# Patient Record
Sex: Female | Born: 1970 | Race: Black or African American | Hispanic: No | Marital: Married | Smoking: Never smoker
Health system: Southern US, Community
[De-identification: ages and names within clinical notes are randomized; demographics above are authoritative.]

## PROBLEM LIST (undated history)

## (undated) DIAGNOSIS — E739 Lactose intolerance, unspecified: Secondary | ICD-10-CM

## (undated) DIAGNOSIS — E559 Vitamin D deficiency, unspecified: Secondary | ICD-10-CM

## (undated) DIAGNOSIS — M549 Dorsalgia, unspecified: Secondary | ICD-10-CM

## (undated) DIAGNOSIS — M7989 Other specified soft tissue disorders: Secondary | ICD-10-CM

## (undated) DIAGNOSIS — M2550 Pain in unspecified joint: Secondary | ICD-10-CM

## (undated) DIAGNOSIS — I890 Lymphedema, not elsewhere classified: Secondary | ICD-10-CM

## (undated) DIAGNOSIS — R0602 Shortness of breath: Secondary | ICD-10-CM

## (undated) DIAGNOSIS — F419 Anxiety disorder, unspecified: Secondary | ICD-10-CM

## (undated) HISTORY — DX: Dorsalgia, unspecified: M54.9

## (undated) HISTORY — DX: Pain in unspecified joint: M25.50

## (undated) HISTORY — DX: Shortness of breath: R06.02

## (undated) HISTORY — PX: BREAST BIOPSY: SHX20

## (undated) HISTORY — DX: Lactose intolerance, unspecified: E73.9

## (undated) HISTORY — DX: Anxiety disorder, unspecified: F41.9

## (undated) HISTORY — DX: Vitamin D deficiency, unspecified: E55.9

## (undated) HISTORY — DX: Lymphedema, not elsewhere classified: I89.0

## (undated) HISTORY — DX: Other specified soft tissue disorders: M79.89

---

## 2008-06-20 DIAGNOSIS — J309 Allergic rhinitis, unspecified: Secondary | ICD-10-CM | POA: Insufficient documentation

## 2008-06-20 DIAGNOSIS — M25569 Pain in unspecified knee: Secondary | ICD-10-CM | POA: Insufficient documentation

## 2008-06-20 DIAGNOSIS — N63 Unspecified lump in unspecified breast: Secondary | ICD-10-CM | POA: Insufficient documentation

## 2008-06-20 DIAGNOSIS — Z833 Family history of diabetes mellitus: Secondary | ICD-10-CM | POA: Insufficient documentation

## 2008-06-20 DIAGNOSIS — Z800 Family history of malignant neoplasm of digestive organs: Secondary | ICD-10-CM | POA: Insufficient documentation

## 2013-12-25 DIAGNOSIS — R87619 Unspecified abnormal cytological findings in specimens from cervix uteri: Secondary | ICD-10-CM | POA: Insufficient documentation

## 2013-12-25 DIAGNOSIS — N840 Polyp of corpus uteri: Secondary | ICD-10-CM | POA: Insufficient documentation

## 2013-12-25 DIAGNOSIS — N882 Stricture and stenosis of cervix uteri: Secondary | ICD-10-CM | POA: Insufficient documentation

## 2013-12-25 DIAGNOSIS — D219 Benign neoplasm of connective and other soft tissue, unspecified: Secondary | ICD-10-CM | POA: Insufficient documentation

## 2016-03-13 DIAGNOSIS — E559 Vitamin D deficiency, unspecified: Secondary | ICD-10-CM | POA: Insufficient documentation

## 2018-06-13 DIAGNOSIS — I872 Venous insufficiency (chronic) (peripheral): Secondary | ICD-10-CM | POA: Insufficient documentation

## 2018-06-13 DIAGNOSIS — G8929 Other chronic pain: Secondary | ICD-10-CM | POA: Insufficient documentation

## 2018-07-13 DIAGNOSIS — Z0001 Encounter for general adult medical examination with abnormal findings: Secondary | ICD-10-CM | POA: Insufficient documentation

## 2018-07-13 DIAGNOSIS — Z0000 Encounter for general adult medical examination without abnormal findings: Secondary | ICD-10-CM | POA: Insufficient documentation

## 2018-10-06 DIAGNOSIS — H7292 Unspecified perforation of tympanic membrane, left ear: Secondary | ICD-10-CM | POA: Insufficient documentation

## 2019-08-09 DIAGNOSIS — Z20828 Contact with and (suspected) exposure to other viral communicable diseases: Secondary | ICD-10-CM | POA: Diagnosis not present

## 2019-08-09 DIAGNOSIS — R6883 Chills (without fever): Secondary | ICD-10-CM | POA: Diagnosis not present

## 2019-10-13 ENCOUNTER — Ambulatory Visit: Payer: Self-pay | Attending: Internal Medicine

## 2019-10-13 DIAGNOSIS — Z23 Encounter for immunization: Secondary | ICD-10-CM | POA: Insufficient documentation

## 2019-10-13 NOTE — Progress Notes (Signed)
   Covid-19 Vaccination Clinic  Name:  Kiaundra Kawamura    MRN: [id number] DOB: [birth year]  10/13/2019  [name] was observed post Covid-19 immunization for 15 minutes without incident. She was provided with Vaccine Information Sheet and instruction to access the V-Safe system.   Ms. Albaladejo was instructed to call 911 with any severe reactions post vaccine: Marland Kitchen Difficulty breathing  . Swelling of face and throat  . A fast heartbeat  . A bad rash all over body  . Dizziness and weakness

## 2019-11-03 ENCOUNTER — Ambulatory Visit: Payer: Self-pay | Attending: Internal Medicine

## 2019-11-03 DIAGNOSIS — Z23 Encounter for immunization: Secondary | ICD-10-CM

## 2019-11-03 NOTE — Progress Notes (Signed)
   Covid-19 Vaccination Clinic  Name:  Theresa Johnson    MRN: [id number] DOB: 1971/08/03  11/03/2019  [name] was observed post Covid-19 immunization for 15 minutes without incident. She was provided with Vaccine Information Sheet and instruction to access the V-Safe system.   [name] was instructed to call 911 with any severe reactions post vaccine: Marland Kitchen Difficulty breathing  . Swelling of face and throat  . A fast heartbeat  . A bad rash all over body  . Dizziness and weakness   Immunizations Administered    Name Date Dose VIS Date Route   Pfizer COVID-19 Vaccine 11/03/2019 10:02 AM 0.3 mL 07/21/2019 Intramuscular   Manufacturer: [place]   Lot: [id number]   [place]: [id number]

## 2019-11-14 ENCOUNTER — Ambulatory Visit: Payer: Self-pay

## 2019-11-22 ENCOUNTER — Encounter: Payer: Self-pay | Admitting: Podiatry

## 2019-11-22 ENCOUNTER — Other Ambulatory Visit: Payer: Self-pay

## 2019-11-22 ENCOUNTER — Ambulatory Visit (INDEPENDENT_AMBULATORY_CARE_PROVIDER_SITE_OTHER): Payer: BC Managed Care – PPO

## 2019-11-22 ENCOUNTER — Ambulatory Visit: Payer: BC Managed Care – PPO | Admitting: Podiatry

## 2019-11-22 VITALS — Temp 98.7°F

## 2019-11-22 DIAGNOSIS — M898X9 Other specified disorders of bone, unspecified site: Secondary | ICD-10-CM | POA: Diagnosis not present

## 2019-11-22 DIAGNOSIS — M79671 Pain in right foot: Secondary | ICD-10-CM | POA: Diagnosis not present

## 2019-11-22 DIAGNOSIS — M722 Plantar fascial fibromatosis: Secondary | ICD-10-CM

## 2019-11-22 MED ORDER — DICLOFENAC SODIUM 75 MG PO TBEC: 75.0000 mg | DELAYED_RELEASE_TABLET | Freq: Two times a day (BID) | ORAL | 2 refills | Status: DC

## 2019-11-22 NOTE — Patient Instructions (Signed)

## 2019-11-22 NOTE — Progress Notes (Signed)
   Subjective:    Patient ID: Theresa Johnson, female    DOB: [birth year], 49 y.o.   MRN: [id number]  HPI    Review of Systems  All other systems reviewed and are negative.      Objective:   Physical Exam        Assessment & Plan:

## 2019-11-23 ENCOUNTER — Other Ambulatory Visit: Payer: Self-pay | Admitting: Podiatry

## 2019-11-23 DIAGNOSIS — M898X9 Other specified disorders of bone, unspecified site: Secondary | ICD-10-CM

## 2019-11-24 NOTE — Progress Notes (Signed)
Subjective:   Patient ID: Theresa Johnson, female   DOB: 49 y.o.   MRN: [id number]   HPI Patient states she is had a lot of pain in her left foot in the heel and has been going on now for several months.  States that she is tried soaks and shoe gear modifications without relief with continued discomfort and also is starting to get some pain on top of her right foot where she is probably walking differently.  Patient does not smoke does like to be active if possible   Review of Systems  All other systems reviewed and are negative.       Objective:  Physical Exam Vitals and nursing note reviewed.  Constitutional:      Appearance: She is well-developed.  Pulmonary:     Effort: Pulmonary effort is normal.  Musculoskeletal:        General: Normal range of motion.  Skin:    General: Skin is warm.  Neurological:     Mental Status: She is alert.     Neurovascular status intact muscle strength found to be adequate range of motion within normal limits.  Patient is found to have discomfort in the left plantar fascia at the insertion of the tendon into the calcaneus with inflammation fluid of the medial band and on the right foot is noted to have mild to moderate dorsal discomfort on the dorsum of the foot localized in nature.  Patient is found to have good digital perfusion is well oriented x3     Assessment:  Acute plantar fasciitis left inflammation fluid around the medial band with probable compensatory tendinitis right     Plan:  H&P both conditions reviewed we will focus on the left foot.  Today I went ahead did sterile prep injected the fascia 3 mg Kenalog 5 mg Xylocaine applied fascial brace with instructions on usage along with oral anti-inflammatories and reappoint to recheck 2 weeks  X-rays indicate there is small spur no indication to stress fracture arthritis or other pathology

## 2019-12-07 ENCOUNTER — Encounter: Payer: Self-pay | Admitting: Podiatry

## 2019-12-07 ENCOUNTER — Ambulatory Visit: Payer: BC Managed Care – PPO | Admitting: Podiatry

## 2019-12-07 ENCOUNTER — Other Ambulatory Visit: Payer: Self-pay

## 2019-12-07 DIAGNOSIS — M2041 Other hammer toe(s) (acquired), right foot: Secondary | ICD-10-CM | POA: Diagnosis not present

## 2019-12-07 DIAGNOSIS — M722 Plantar fascial fibromatosis: Secondary | ICD-10-CM | POA: Diagnosis not present

## 2019-12-07 MED ORDER — TERBINAFINE HCL 250 MG PO TABS: ORAL_TABLET | ORAL | 0 refills | Status: DC

## 2019-12-07 NOTE — Progress Notes (Signed)
Subjective:   Patient ID: Theresa Johnson, female   DOB: 49 y.o.   MRN: [id number]   HPI Patient presents stating she is improving but she still has discomfort and states that she is using the brace and supportive shoe gear   ROS      Objective:  Physical Exam  Neurovascular status intact with patient's left heel arch improved but pain still present upon deep palpation with moderate depression of the arch     Assessment:  Fasciitis-like symptoms still exhibiting discomfort with mechanical dysfunction     Plan:  H&P reviewed conditions and I have recommended long-term physical therapy continued stretching exercises and patient will be seen back to recheck again.  I encouraged her to call with any questions concerns which may arise

## 2019-12-19 ENCOUNTER — Other Ambulatory Visit: Payer: Self-pay | Admitting: Obstetrics and Gynecology

## 2019-12-19 DIAGNOSIS — Z01419 Encounter for gynecological examination (general) (routine) without abnormal findings: Secondary | ICD-10-CM | POA: Diagnosis not present

## 2019-12-19 DIAGNOSIS — Z1231 Encounter for screening mammogram for malignant neoplasm of breast: Secondary | ICD-10-CM

## 2020-01-05 ENCOUNTER — Ambulatory Visit
Admission: RE | Admit: 2020-01-05 | Discharge: 2020-01-05 | Disposition: A | Discharge status: Home / Self Care | Payer: BC Managed Care – PPO | Source: Ambulatory Visit | Attending: Obstetrics and Gynecology | Admitting: Obstetrics and Gynecology

## 2020-01-05 ENCOUNTER — Other Ambulatory Visit: Payer: Self-pay

## 2020-01-05 DIAGNOSIS — Z1231 Encounter for screening mammogram for malignant neoplasm of breast: Secondary | ICD-10-CM

## 2020-01-10 ENCOUNTER — Other Ambulatory Visit: Payer: Self-pay | Admitting: Obstetrics and Gynecology

## 2020-01-10 DIAGNOSIS — R928 Other abnormal and inconclusive findings on diagnostic imaging of breast: Secondary | ICD-10-CM

## 2020-01-16 ENCOUNTER — Encounter: Payer: Self-pay | Admitting: Family Medicine

## 2020-01-16 ENCOUNTER — Ambulatory Visit (INDEPENDENT_AMBULATORY_CARE_PROVIDER_SITE_OTHER): Payer: BC Managed Care – PPO | Admitting: Family Medicine

## 2020-01-16 ENCOUNTER — Other Ambulatory Visit: Payer: Self-pay

## 2020-01-16 VITALS — BP 120/84 | Ht 67.0 in | Wt 228.4 lb

## 2020-01-16 DIAGNOSIS — M62838 Other muscle spasm: Secondary | ICD-10-CM | POA: Diagnosis not present

## 2020-01-16 DIAGNOSIS — S29012A Strain of muscle and tendon of back wall of thorax, initial encounter: Secondary | ICD-10-CM

## 2020-01-16 NOTE — Patient Instructions (Addendum)
Thank you for coming in today. Plan for PT.  Get xray cervical spine today.  Use heating pad and TENS unit.  Recheck in 6 weeks.  Let me know if you have a problem.   TENS UNIT: This is helpful for muscle pain and spasm.   Search and Purchase a TENS 7000 2nd edition at  www.tenspros.com or www.Kimble.com It should be less than $30.     TENS unit instructions: Do not shower or bathe with the unit on Turn the unit off before removing electrodes or batteries If the electrodes lose stickiness add a drop of water to the electrodes after they are disconnected from the unit and place on plastic sheet. If you continued to have difficulty, call the TENS unit company to purchase more electrodes. Do not apply lotion on the skin area prior to use. Make sure the skin is clean and dry as this will help prolong the life of the electrodes. After use, always check skin for unusual red areas, rash or other skin difficulties. If there are any skin problems, does not apply electrodes to the same area. Never remove the electrodes from the unit by pulling the wires. Do not use the TENS unit or electrodes other than as directed. Do not change electrode placement without consultating your therapist or physician. Keep 2 fingers with between each electrode. Wear time ratio is 2:1, on to off times.    For example on for 30 minutes off for 15 minutes and then on for 30 minutes off for 15 minutes

## 2020-01-16 NOTE — Progress Notes (Signed)
    Subjective:    CC: L-sided neck pain  I, Molly Weber, LAT, ATC, am serving as scribe for Dr. Lynne Leader.  HPI: Pt is a 49 y/o female presenting w/ c/o L-sided neck/scapular pain x approximately one year that has worsened over the last 6 months.  She works as an Field seismologist.  Her mother has been seen by my clinic for back pain previously.  No radiating pain weakness or numbness distally.  Radiating pain: No UE numbness/tingling: No UE weakness: No Aggravating factors: shopping; wearing a cross-body bag; lifting; working as an Futures trader Treatments tried: massage; Biofreeze; Aleve  Pertinent review of Systems: No fevers or chills  Relevant historical information: Family history: Cancer and diabetes.   Objective:    Vitals:   01/16/20 0806  BP: 120/84   General: Well Developed, well nourished, and in no acute distress.   MSK: C-spine: Normal-appearing Normal motion. Nontender midline. Tender palpation left lower cervical paraspinal musculature and trapezius area. Upper extremity strength reflexes and sensation are equal normal throughout. T-spine normal-appearing nontender midline. Tender palpation left rhomboid region. Normal shoulder motion with normal scapular motion.  Lab and Radiology Results Cervical spine x-rays ordered will be done in the near future.    Impression and Recommendations:    Assessment and Plan: 49 y.o. female with left cervical and thoracic paraspinal muscle pain.  Plan for trial of physical therapy heating pad and TENS unit.  Will get x-ray as well.  Recheck back in 6 weeks or so or sooner if needed.Marland Kitchen  PDMP not reviewed this encounter. Orders Placed This Encounter  Procedures  . DG Cervical Spine 2 or 3 views    Standing Status:   Future    Standing Expiration Date:   01/15/2021    Order Specific Question:   Reason for Exam (SYMPTOM  OR DIAGNOSIS REQUIRED)    Answer:   eval neck pain    Order Specific Question:   Is  patient pregnant?    Answer:   No    Order Specific Question:   Preferred imaging location?    Answer:   [facility]    Order Specific Question:   Radiology Contrast Protocol - do NOT remove file path    Answer:   \\[email or web address].pdf  . Ambulatory referral to Physical Therapy    Referral Priority:   Routine    Referral Type:   Physical Medicine    Referral Reason:   Specialty Services Required    Requested Specialty:   Physical Therapy   No orders of the defined types were placed in this encounter.   Discussed warning signs or symptoms. Please see discharge instructions. Patient expresses understanding.   The above documentation has been reviewed and is accurate and complete Lynne Leader, M.D.

## 2020-01-18 ENCOUNTER — Ambulatory Visit (INDEPENDENT_AMBULATORY_CARE_PROVIDER_SITE_OTHER): Payer: BC Managed Care – PPO

## 2020-01-18 DIAGNOSIS — M62838 Other muscle spasm: Secondary | ICD-10-CM | POA: Diagnosis not present

## 2020-01-18 DIAGNOSIS — S29012A Strain of muscle and tendon of back wall of thorax, initial encounter: Secondary | ICD-10-CM

## 2020-01-18 DIAGNOSIS — M542 Cervicalgia: Secondary | ICD-10-CM | POA: Diagnosis not present

## 2020-01-19 NOTE — Progress Notes (Signed)
Xray cervical spine shoes some arthritis at the base of the neck. This could be contributing to pain and muscle spasm. Plan for PT.

## 2020-01-22 ENCOUNTER — Ambulatory Visit
Admission: RE | Admit: 2020-01-22 | Discharge: 2020-01-22 | Disposition: A | Discharge status: Home / Self Care | Payer: BC Managed Care – PPO | Source: Ambulatory Visit | Attending: Obstetrics and Gynecology | Admitting: Obstetrics and Gynecology

## 2020-01-22 ENCOUNTER — Telehealth: Payer: Self-pay | Admitting: Family Medicine

## 2020-01-22 ENCOUNTER — Ambulatory Visit: Payer: BC Managed Care – PPO

## 2020-01-22 ENCOUNTER — Other Ambulatory Visit: Payer: Self-pay

## 2020-01-22 DIAGNOSIS — R922 Inconclusive mammogram: Secondary | ICD-10-CM | POA: Diagnosis not present

## 2020-01-22 DIAGNOSIS — M62838 Other muscle spasm: Secondary | ICD-10-CM

## 2020-01-22 DIAGNOSIS — S29012A Strain of muscle and tendon of back wall of thorax, initial encounter: Secondary | ICD-10-CM

## 2020-01-22 DIAGNOSIS — R928 Other abnormal and inconclusive findings on diagnostic imaging of breast: Secondary | ICD-10-CM

## 2020-01-22 NOTE — Telephone Encounter (Signed)
Pt referred by [place] to [facility] PT, they are 5 weeks out and she would like to start PT sooner.  Can we refer her elsewhere that could get her in sooner? Her husband goes to [facility], but she is not sure if the is in network.

## 2020-01-23 NOTE — Telephone Encounter (Signed)
Called pt and LM w/ info regarding new PT referral to [facility].

## 2020-01-23 NOTE — Telephone Encounter (Signed)
Referred to physical therapy associate with [facility] care office.  Please let me know if that is not going to work.

## 2020-02-02 ENCOUNTER — Encounter: Payer: Self-pay | Admitting: Rehabilitative and Restorative Service Providers"

## 2020-02-02 ENCOUNTER — Other Ambulatory Visit: Payer: Self-pay

## 2020-02-02 ENCOUNTER — Ambulatory Visit (INDEPENDENT_AMBULATORY_CARE_PROVIDER_SITE_OTHER): Payer: BC Managed Care – PPO | Admitting: Rehabilitative and Restorative Service Providers"

## 2020-02-02 DIAGNOSIS — R293 Abnormal posture: Secondary | ICD-10-CM | POA: Diagnosis not present

## 2020-02-02 DIAGNOSIS — M6281 Muscle weakness (generalized): Secondary | ICD-10-CM

## 2020-02-02 DIAGNOSIS — M25512 Pain in left shoulder: Secondary | ICD-10-CM | POA: Diagnosis not present

## 2020-02-02 DIAGNOSIS — G8929 Other chronic pain: Secondary | ICD-10-CM

## 2020-02-02 NOTE — Therapy (Signed)
[facility] Physical Therapy [address] [place], [place], [address] Phone: [phone]   Fax:  [phone]  Physical Therapy Evaluation  Patient Details  Name: Theresa Johnson MRN: [id number] Date of Birth: Jan 07, 1971 Referring Provider (PT): Gregor Hams   Encounter Date: 02/02/2020   PT End of Session - 02/02/20 1209    Visit Number 1    Number of Visits 12    PT Start Time 1100    PT Stop Time 1140    PT Time Calculation (min) 40 min    Activity Tolerance Patient tolerated treatment well;No increased pain    Behavior During Therapy [facility] for tasks assessed/performed           History reviewed. No pertinent past medical history.  Past Surgical History:  Procedure Laterality Date  . BREAST BIOPSY      There were no vitals filed for this visit.    Subjective Assessment - 02/02/20 1151    Subjective Theresa Johnson notes increasing L scapular, rhomboid and upper trapezius pain over the past several months.  She notes sitting, time at the computer, sleeping and slouched postures increase her pain.    Limitations Sitting;Writing;Reading    How long can you sit comfortably? Up to 30 minutes maybe    How long can you stand comfortably? Limited by plantar fasciitis    How long can you walk comfortably? Limited by plantar fasciitis    Patient Stated Goals Get rid of upper trapezius pain and be able to work without limitations.    Currently in Pain? Yes    Pain Score 5     Pain Location Scapula    Pain Orientation Left    Pain Descriptors / Indicators Burning;Spasm;Sore    Pain Type Chronic pain    Pain Radiating Towards Mid scapula to neck    Pain Onset More than a month ago    Pain Frequency Constant    Aggravating Factors  Flexed postures    Pain Relieving Factors Nothing    Effect of Pain on Daily Activities Limits computer work, lifting and sitting time.    Multiple Pain Sites No              [facility] PT Assessment - 02/02/20 0001       Assessment   Medical Diagnosis L upper trapezius strain    Referring Provider (PT) Gregor Hams    Onset Date/Surgical Date 10/09/18      Balance Screen   Has the patient fallen in the past 6 months No    Has the patient had a decrease in activity level because of a fear of falling?  No    Is the patient reluctant to leave their home because of a fear of falling?  No      Cognition   Overall Cognitive Status Within Functional Limits for tasks assessed      Posture/Postural Control   Posture/Postural Control Postural limitations    Postural Limitations Rounded Shoulders      ROM / Strength   AROM / PROM / Strength AROM;Strength      AROM   Overall AROM  Deficits    AROM Assessment Site Cervical    Cervical Extension 55    Cervical - Right Side Bend 15    Cervical - Left Side Bend 30    Cervical - Right Rotation 60    Cervical - Left Rotation 50      Strength   Overall Strength Deficits  Strength Assessment Site Cervical    Cervical Extension 4/5    Cervical - Right Side Bend 4-/5    Cervical - Left Side Bend 4-/5                      Objective measurements completed on examination: See above findings.       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  Prescribed and reviewed an appropriate HEP.     Person(s) Educated Patient    Methods Explanation;Demonstration;Verbal cues;Handout    Comprehension Verbalized understanding;Need further instruction;Returned demonstration;Verbal cues required            PT Short Term Goals - 02/02/20 1214      PT SHORT TERM GOAL #1   Title Theresa Johnson will be independent and compliant with her starter HEP.    Baseline No HEP.    Time 2    Period Weeks    Status New    Target Date 02/16/20             PT Long Term Goals - 02/02/20 1215      PT LONG TERM GOAL #1   Title Theresa Johnson will report L upper trapezius, scapular and rhomboid pain as consistently < 2/10 on the Numeric Pain Rating Scale.    Baseline 5/10    Time 6    Period Weeks    Status New    Target Date 03/15/20      PT LONG TERM GOAL #2   Title Improve cervical AROM for extension to 65; lateral bending to 30/30 and rotation to 60/60 degrees.    Baseline 55; 30/15; 50/60    Time 6    Period Weeks    Status New    Target Date 03/15/20      PT LONG TERM GOAL #3   Title Improve cervical strength for extension and lateral bending to 5/5    Baseline 4- to 4/5    Time 6    Period Weeks    Status New    Target Date 03/15/20      PT LONG TERM GOAL #4   Title Theresa Johnson will return to work without being limited by L scapular or muscle pain.    Baseline Poor work endurance necessitates breaks.    Time 6    Period Weeks    Status New    Target Date 03/15/20      PT LONG TERM GOAL #5   Title Theresa Johnson will be independent with her HEP at DC.    Baseline No HEP.    Time 6    Period Weeks    Status New    Target Date 03/15/20  Plan - 02/02/20 1209    Clinical Impression Statement Theresa Johnson has poor posture, poor postural awareness and poor neck and upper back strength.  She will benefit from supervised physical therapy to improve previously mentioned impairments so she can complete her normal work activities (including work on the computer) without being  stopped by pain.    Examination-Activity Limitations Sit;Sleep;Other   Computer work and reading are limited   Designer, jewellery Stable/Uncomplicated    Clinical Decision Making Low    Rehab Potential Good    PT Frequency 2x / week    PT Duration 6 weeks    PT Treatment/Interventions ADLs/Self Care Home Management;Moist Heat;Iontophoresis 4mg /ml Dexamethasone;Electrical Stimulation;Cryotherapy;Therapeutic activities;Functional mobility training;Therapeutic exercise;Neuromuscular re-education;Patient/family education;Manual techniques;Dry needling    PT Next Visit Plan Postural and body mechanics work along with cervical, scapular and upper back strengthening.    PT Home Exercise Plan See patient instructions.    Consulted and Agree with Plan of Care Patient           Patient will benefit from skilled therapeutic intervention in order to improve the following deficits and impairments:  Decreased activity tolerance, Decreased range of motion, Decreased strength, Increased muscle spasms, Impaired UE functional use, Postural dysfunction, Improper body mechanics, Pain  Visit Diagnosis: Posture abnormality  Chronic left shoulder pain  Muscle weakness (generalized)     Problem List Patient Active Problem List   Diagnosis Date Noted  . Perforated tympanic membrane, left 10/06/2018  . Encounter for general adult medical examination with abnormal findings 07/13/2018  . Chronic midline low back pain with left-sided sciatica 06/13/2018  . Venous insufficiency of both lower extremities 06/13/2018  . Vitamin D deficiency 03/13/2016  . Abnormal Pap smear of cervix 12/25/2013  . Cervical stenosis (uterine cervix) 12/25/2013  . Endometrial polyp 12/25/2013  . Fibroids 12/25/2013  . Allergic rhinitis 06/20/2008  . Breast nodule 06/20/2008  . Family history of diabetes mellitus (DM) 06/20/2008  . FH: colon cancer 06/20/2008    . Knee pain 06/20/2008    Farley Ly PT, MPT 02/02/2020, 12:21 PM  [facility] Physical Therapy [address] [place], [place], [address] Phone: [phone]   Fax:  [phone]  Name: Theresa Johnson MRN: [id number] Date of Birth: Mar 27, 1971

## 2020-02-02 NOTE — Patient Instructions (Signed)
Access Code: [email or web address] URL: [email or web address] Date: 02/02/2020 Prepared by: Vista Mink  Exercises Standing Scapular Retraction - 5-10 x daily - 7 x weekly - 1 sets - 5 reps - 5 hold Standing Isometric Cervical Extension with Manual Resistance - 5 x daily - 7 x weekly - 1 sets - 5 reps - 5 hold

## 2020-02-06 ENCOUNTER — Ambulatory Visit (INDEPENDENT_AMBULATORY_CARE_PROVIDER_SITE_OTHER): Payer: BC Managed Care – PPO | Admitting: Physical Therapy

## 2020-02-06 ENCOUNTER — Other Ambulatory Visit: Payer: Self-pay

## 2020-02-06 ENCOUNTER — Encounter: Payer: Self-pay | Admitting: Physical Therapy

## 2020-02-06 DIAGNOSIS — R293 Abnormal posture: Secondary | ICD-10-CM

## 2020-02-06 DIAGNOSIS — M6281 Muscle weakness (generalized): Secondary | ICD-10-CM

## 2020-02-06 DIAGNOSIS — G8929 Other chronic pain: Secondary | ICD-10-CM | POA: Diagnosis not present

## 2020-02-06 DIAGNOSIS — M25512 Pain in left shoulder: Secondary | ICD-10-CM

## 2020-02-06 NOTE — Therapy (Signed)
[facility] Physical Therapy [address] [place], [place], [address] Phone: [phone]   Fax:  [phone]  Physical Therapy Treatment  Patient Details  Name: Theresa Johnson MRN: [id number] Date of Birth: 1971-06-26 Referring Provider (PT): Gregor Hams   Encounter Date: 02/06/2020   PT End of Session - 02/06/20 1203    Visit Number 2    Number of Visits 12    PT Start Time 1937    PT Stop Time 9024    PT Time Calculation (min) 46 min    Activity Tolerance Patient tolerated treatment well;No increased pain    Behavior During Therapy [facility] for tasks assessed/performed           History reviewed. No pertinent past medical history.  Past Surgical History:  Procedure Laterality Date  . BREAST BIOPSY      There were no vitals filed for this visit.   Subjective Assessment - 02/06/20 1200    Subjective relays about 7/10 pain in Left trap/paraspinals    Limitations Sitting;Writing;Reading    How long can you sit comfortably? Up to 30 minutes maybe    How long can you stand comfortably? Limited by plantar fasciitis    How long can you walk comfortably? Limited by plantar fasciitis    Patient Stated Goals Get rid of upper trapezius pain and be able to work without limitations.    Pain Onset More than a month ago             [facility] Adult PT Treatment/Exercise - 02/06/20 0001      Neck Exercises: Machines for Strengthening   UBE (Upper Arm Bike) 5 min retro no resistance      Neck Exercises: Theraband   Shoulder Extension 20 reps;Red    Rows 20 reps;Red    Shoulder External Rotation Limitations bilat with red X 20       Neck Exercises: Seated   Other Seated Exercise pulleys X 2 min flexion and 2 min abduction      Modalities   Modalities Cryotherapy      Cryotherapy   Number Minutes Cryotherapy 8 Minutes    Cryotherapy Location Shoulder;Cervical    Type of Cryotherapy Ice pack      Manual Therapy   Manual therapy comments STM/IASTM  and T.P release to Lt upper trap, mid trap, cervical P.S, levator      Neck Exercises: Stretches   Upper Trapezius Stretch Left;3 reps;30 seconds    Levator Stretch Left;3 reps;30 seconds                  PT Education - 02/06/20 1202    Education Details theracane    Person(s) Educated Patient    Methods Explanation;Demonstration;Verbal cues    Comprehension Verbalized understanding;Returned demonstration            PT Short Term Goals - 02/02/20 1214      PT SHORT TERM GOAL #1   Title Liliahna will be independent and compliant with her starter HEP.    Baseline No HEP.    Time 2    Period Weeks    Status New    Target Date 02/16/20             PT Long Term Goals - 02/02/20 1215      PT LONG TERM GOAL #1   Title Makensie will report L upper trapezius, scapular and rhomboid pain as consistently < 2/10 on the Numeric Pain Rating Scale.    Baseline  5/10    Time 6    Period Weeks    Status New    Target Date 03/15/20      PT LONG TERM GOAL #2   Title Improve cervical AROM for extension to 65; lateral bending to 30/30 and rotation to 60/60 degrees.    Baseline 55; 30/15; 50/60    Time 6    Period Weeks    Status New    Target Date 03/15/20      PT LONG TERM GOAL #3   Title Improve cervical strength for extension and lateral bending to 5/5    Baseline 4- to 4/5    Time 6    Period Weeks    Status New    Target Date 03/15/20      PT LONG TERM GOAL #4   Title Latori will return to work without being limited by L scapular or muscle pain.    Baseline Poor work endurance necessitates breaks.    Time 6    Period Weeks    Status New    Target Date 03/15/20      PT LONG TERM GOAL #5   Title Donte will be independent with her HEP at DC.    Baseline No HEP.    Time 6    Period Weeks    Status New    Target Date 03/15/20                 Plan - 02/06/20 1204    Clinical Impression Statement Session focused on postural strengthening,  neck/shoulder ROM and stretching, and manual therapy all to increase ROM and decrease pain. She was shown theracane which was recommended she get for home for self trigger point release. Continue POC    Examination-Activity Limitations Sit;Sleep;Other   Computer work and reading are limited   Designer, jewellery Stable/Uncomplicated    Rehab Potential Good    PT Frequency 2x / week    PT Duration 6 weeks    PT Treatment/Interventions ADLs/Self Care Home Management;Moist Heat;Iontophoresis 4mg /ml Dexamethasone;Electrical Stimulation;Cryotherapy;Therapeutic activities;Functional mobility training;Therapeutic exercise;Neuromuscular re-education;Patient/family education;Manual techniques;Dry needling    PT Next Visit Plan Postural and body mechanics work along with cervical, scapular and upper back strengthening.    PT Home Exercise Plan See patient instructions.    Consulted and Agree with Plan of Care Patient           Patient will benefit from skilled therapeutic intervention in order to improve the following deficits and impairments:  Decreased activity tolerance, Decreased range of motion, Decreased strength, Increased muscle spasms, Impaired UE functional use, Postural dysfunction, Improper body mechanics, Pain  Visit Diagnosis: Posture abnormality  Chronic left shoulder pain  Muscle weakness (generalized)     Problem List Patient Active Problem List   Diagnosis Date Noted  . Perforated tympanic membrane, left 10/06/2018  . Encounter for general adult medical examination with abnormal findings 07/13/2018  . Chronic midline low back pain with left-sided sciatica 06/13/2018  . Venous insufficiency of both lower extremities 06/13/2018  . Vitamin D deficiency 03/13/2016  . Abnormal Pap smear of cervix 12/25/2013  . Cervical stenosis (uterine cervix) 12/25/2013  . Endometrial polyp 12/25/2013  . Fibroids 12/25/2013    . Allergic rhinitis 06/20/2008  . Breast nodule 06/20/2008  . Family history of diabetes mellitus (DM) 06/20/2008  . FH: colon cancer 06/20/2008  . Knee pain 06/20/2008    Debbe Odea, PT,DPT 02/06/2020, 12:05 PM  [facility] Physical Therapy [address] [place], [place], [address] Phone: [phone]   Fax:  [phone]  Name: Theresa Johnson MRN: [id number] Date of Birth: August 31, 1970

## 2020-02-15 ENCOUNTER — Encounter: Payer: Self-pay | Admitting: Rehabilitative and Restorative Service Providers"

## 2020-02-15 ENCOUNTER — Other Ambulatory Visit: Payer: Self-pay

## 2020-02-15 ENCOUNTER — Ambulatory Visit (INDEPENDENT_AMBULATORY_CARE_PROVIDER_SITE_OTHER): Payer: BC Managed Care – PPO | Admitting: Rehabilitative and Restorative Service Providers"

## 2020-02-15 DIAGNOSIS — M25512 Pain in left shoulder: Secondary | ICD-10-CM | POA: Diagnosis not present

## 2020-02-15 DIAGNOSIS — R293 Abnormal posture: Secondary | ICD-10-CM

## 2020-02-15 DIAGNOSIS — M6281 Muscle weakness (generalized): Secondary | ICD-10-CM | POA: Diagnosis not present

## 2020-02-15 DIAGNOSIS — G8929 Other chronic pain: Secondary | ICD-10-CM | POA: Diagnosis not present

## 2020-02-15 NOTE — Therapy (Signed)
[facility] Therapy [address] [place], [place], [address] Phone: [phone]   Fax:  [phone]  Physical Therapy Treatment  Patient Details  Name: Theresa Johnson MRN: [id number] Date of Birth: 1971/03/09 Referring Provider (PT): Gregor Hams   Encounter Date: 02/15/2020   PT End of Session - 02/15/20 1609    Visit Number 3    Number of Visits 12    PT Start Time 8891    PT Stop Time 6945    PT Time Calculation (min) 39 min    Activity Tolerance Patient tolerated treatment well;No increased pain    Behavior During Therapy [facility] for tasks assessed/performed           History reviewed. No pertinent past medical history.  Past Surgical History:  Procedure Laterality Date  . BREAST BIOPSY      There were no vitals filed for this visit.   Subjective Assessment - 02/15/20 1604    Subjective Theresa Johnson mentioned she will be driving to [facility] tomorrow (9 hours).    Limitations Sitting;Writing;Reading    How long can you sit comfortably? Up to 30 minutes maybe    How long can you stand comfortably? Limited by plantar fasciitis    How long can you walk comfortably? Limited by plantar fasciitis    Patient Stated Goals Get rid of upper trapezius pain and be able to work without limitations.    Currently in Pain? Yes    Pain Score 3     Pain Location Scapula    Pain Orientation Left    Pain Descriptors / Indicators Burning;Sore;Spasm    Pain Type Chronic pain    Pain Radiating Towards Mostly scapular    Pain Onset More than a month ago    Pain Frequency Constant    Aggravating Factors  Sitting    Pain Relieving Factors Exercises    Effect of Pain on Daily Activities Limits her ability to dive and use a computer for work.                             [facility] Adult PT Treatment/Exercise - 02/15/20 0001      Neck Exercises: Machines for Strengthening   UBE (Upper Arm Bike) 8 minutes pull only Level 3.5 :30 Pull/:30 Rest        Neck Exercises: Theraband   Shoulder Extension Red;10 reps   Palms up 2 sets   Rows Blue;10 reps   2 sets   Shoulder External Rotation Limitations 2 sets of 10 with red (slow eccentrics)      Neck Exercises: Standing   Other Standing Exercises Shoulder blade pinches 15X 5 seconds    Other Standing Exercises Pull to chest 45# 20X      Neck Exercises: Seated   Cervical Isometrics Extension    Cervical Isometrics Limitations 3 sets of 5 for 5 seconds                  PT Education - 02/15/20 1607    Education Details Discussed the importance of taking short, frequent breaks on her 9 hour drive up to [employer].  Reviewed HEP and progressed postural strengthening exercises.    Person(s) Educated Patient    Methods Explanation;Demonstration;Tactile cues;Verbal cues    Comprehension Verbalized understanding;Need further instruction;Returned demonstration;Tactile cues required;Verbal cues required            PT Short Term Goals - 02/15/20 1609  PT SHORT TERM GOAL #1   Title Theresa Johnson will be independent and compliant with her starter HEP.    Baseline No HEP.    Time 2    Period Weeks    Status Achieved    Target Date 02/16/20             PT Long Term Goals - 02/02/20 1215      PT LONG TERM GOAL #1   Title Theresa Johnson will report L upper trapezius, scapular and rhomboid pain as consistently < 2/10 on the Numeric Pain Rating Scale.    Baseline 5/10    Time 6    Period Weeks    Status New    Target Date 03/15/20      PT LONG TERM GOAL #2   Title Improve cervical AROM for extension to 65; lateral bending to 30/30 and rotation to 60/60 degrees.    Baseline 55; 30/15; 50/60    Time 6    Period Weeks    Status New    Target Date 03/15/20      PT LONG TERM GOAL #3   Title Improve cervical strength for extension and lateral bending to 5/5    Baseline 4- to 4/5    Time 6    Period Weeks    Status New    Target Date 03/15/20      PT LONG TERM GOAL #4    Title Theresa Johnson will return to work without being limited by L scapular or muscle pain.    Baseline Poor work endurance necessitates breaks.    Time 6    Period Weeks    Status New    Target Date 03/15/20      PT LONG TERM GOAL #5   Title Theresa Johnson will be independent with her HEP at DC.    Baseline No HEP.    Time 6    Period Weeks    Status New    Target Date 03/15/20                 Plan - 02/15/20 1610    Clinical Impression Statement Theresa Johnson has been compliant with her early HEP.  Postural strengthening and postural awareness remain the early focus.  Theresa Johnson will be out of town for a week and will return after that for quick reassessment and progression of strengthening and body mechanics activities.    Examination-Activity Limitations Sit;Sleep;Other   Computer work and reading are limited   Designer, jewellery Stable/Uncomplicated    Rehab Potential Good    PT Frequency 2x / week    PT Duration 6 weeks    PT Treatment/Interventions ADLs/Self Care Home Management;Moist Heat;Iontophoresis 4mg /ml Dexamethasone;Electrical Stimulation;Cryotherapy;Therapeutic activities;Functional mobility training;Therapeutic exercise;Neuromuscular re-education;Patient/family education;Manual techniques;Dry needling    PT Next Visit Plan Postural and body mechanics work along with cervical, scapular and upper back strengthening.    PT Home Exercise Plan See patient instructions.    Consulted and Agree with Plan of Care Patient           Patient will benefit from skilled therapeutic intervention in order to improve the following deficits and impairments:  Decreased activity tolerance, Decreased range of motion, Decreased strength, Increased muscle spasms, Impaired UE functional use, Postural dysfunction, Improper body mechanics, Pain  Visit Diagnosis: Posture abnormality  Chronic left shoulder pain  Muscle weakness  (generalized)     Problem List Patient Active Problem List   Diagnosis Date Noted  . Perforated tympanic  membrane, left 10/06/2018  . Encounter for general adult medical examination with abnormal findings 07/13/2018  . Chronic midline low back pain with left-sided sciatica 06/13/2018  . Venous insufficiency of both lower extremities 06/13/2018  . Vitamin D deficiency 03/13/2016  . Abnormal Pap smear of cervix 12/25/2013  . Cervical stenosis (uterine cervix) 12/25/2013  . Endometrial polyp 12/25/2013  . Fibroids 12/25/2013  . Allergic rhinitis 06/20/2008  . Breast nodule 06/20/2008  . Family history of diabetes mellitus (DM) 06/20/2008  . FH: colon cancer 06/20/2008  . Knee pain 06/20/2008    Farley Ly PT, MPT 02/15/2020, 4:13 PM  [facility] Physical Therapy [address] [place], [place], [address] Phone: [phone]   Fax:  [phone]  Name: Theresa Johnson MRN: [id number] Date of Birth: 1971/06/26

## 2020-02-27 ENCOUNTER — Other Ambulatory Visit: Payer: Self-pay

## 2020-02-27 ENCOUNTER — Ambulatory Visit (INDEPENDENT_AMBULATORY_CARE_PROVIDER_SITE_OTHER): Payer: BC Managed Care – PPO | Admitting: Physical Therapy

## 2020-02-27 ENCOUNTER — Encounter: Payer: Self-pay | Admitting: Physical Therapy

## 2020-02-27 DIAGNOSIS — M6281 Muscle weakness (generalized): Secondary | ICD-10-CM | POA: Diagnosis not present

## 2020-02-27 DIAGNOSIS — R293 Abnormal posture: Secondary | ICD-10-CM | POA: Diagnosis not present

## 2020-02-27 DIAGNOSIS — G8929 Other chronic pain: Secondary | ICD-10-CM

## 2020-02-27 DIAGNOSIS — M25512 Pain in left shoulder: Secondary | ICD-10-CM | POA: Diagnosis not present

## 2020-02-27 NOTE — Therapy (Signed)
Theresa Johnson [facility] [address] [place], [place], [address] Phone: [phone]   Fax:  [phone]  Physical Therapy Treatment  Patient Details  Name: Theresa Johnson MRN: [id number] Date of Birth: Sep 22, 1970 Referring Provider (PT): Gregor Hams   Encounter Date: 02/27/2020   PT End of Session - 02/27/20 1424    Visit Number 4    Number of Visits 12    PT Start Time 1350    PT Stop Time 1435   last 7 min on ice   PT Time Calculation (min) 45 min    Activity Tolerance Patient tolerated treatment well;No increased pain    Behavior During Therapy [facility] for tasks assessed/performed           History reviewed. No pertinent past medical history.  Past Surgical History:  Procedure Laterality Date  . BREAST BIOPSY      There were no vitals filed for this visit.   Subjective Assessment - 02/27/20 1357    Subjective Theresa Johnson mentioned she did well with driving overall in terms of her neck and shoulder pain, but having some pain down her right leg since. She denies pain in her shoulder/neck today    Limitations Sitting;Writing;Reading    How long can you sit comfortably? Up to 30 minutes maybe    How long can you stand comfortably? Limited by plantar fasciitis    How long can you walk comfortably? Limited by plantar fasciitis    Patient Stated Goals Get rid of upper trapezius pain and be able to work without limitations.    Currently in Pain? No/denies    Pain Onset More than a month ago             [facility] Adult PT Treatment/Exercise - 02/27/20 0001      Neck Exercises: Machines for Strengthening   UBE (Upper Arm Bike) 5 min retro L3    Cybex Row 25 lbs 3X10    Lat Pull 25 lbs 3X10      Neck Exercises: Theraband   Shoulder Extension 20 reps;Red    Shoulder External Rotation Limitations 2 sets of 10 bilat with red (slow eccentrics)    Horizontal ABduction 20 reps;Red      Neck Exercises: Standing   Other Standing Exercises neck  isometrics pushing into ball on wall for cervical flexion, sidebening, and extension 5 sec X 15 reps ea       Cryotherapy   Number Minutes Cryotherapy 7 Minutes    Cryotherapy Location Shoulder;Cervical    Type of Cryotherapy Ice pack                    PT Short Term Goals - 02/15/20 1609      PT SHORT TERM GOAL #1   Title Theresa Johnson will be independent and compliant with her starter HEP.    Baseline No HEP.    Time 2    Period Weeks    Status Achieved    Target Date 02/16/20             PT Long Term Goals - 02/02/20 1215      PT LONG TERM GOAL #1   Title Theresa Johnson will report L upper trapezius, scapular and rhomboid pain as consistently < 2/10 on the Numeric Pain Rating Scale.    Baseline 5/10    Time 6    Period Weeks    Status New    Target Date 03/15/20      PT LONG TERM  GOAL #2   Title Improve cervical AROM for extension to 65; lateral bending to 30/30 and rotation to 60/60 degrees.    Baseline 55; 30/15; 50/60    Time 6    Period Weeks    Status New    Target Date 03/15/20      PT LONG TERM GOAL #3   Title Improve cervical strength for extension and lateral bending to 5/5    Baseline 4- to 4/5    Time 6    Period Weeks    Status New    Target Date 03/15/20      PT LONG TERM GOAL #4   Title Ambert will return to work without being limited by L scapular or muscle pain.    Baseline Poor work endurance necessitates breaks.    Time 6    Period Weeks    Status New    Target Date 03/15/20      PT LONG TERM GOAL #5   Title Theresa Johnson will be independent with her HEP at DC.    Baseline No HEP.    Time 6    Period Weeks    Status New    Target Date 03/15/20                 Plan - 02/27/20 1425    Clinical Impression Statement She appears to be making good progress and her pain is overall better managed since beginning PT. She was even able to drive 9 hours this weekend one way without much neck/shoulder pain. Progressed her strengthening  today with good tolerance and without complaints. Continue POC    Examination-Activity Limitations Sit;Sleep;Other   Computer work and reading are limited   Designer, jewellery Stable/Uncomplicated    Rehab Potential Good    PT Frequency 2x / week    PT Duration 6 weeks    PT Treatment/Interventions ADLs/Self Care Home Management;Moist Heat;Iontophoresis 4mg /ml Dexamethasone;Electrical Stimulation;Cryotherapy;Therapeutic activities;Functional mobility training;Therapeutic exercise;Neuromuscular re-education;Patient/family education;Manual techniques;Dry needling    PT Next Visit Plan Postural and body mechanics work along with cervical, scapular and upper back strengthening.    PT Home Exercise Plan See patient instructions.    Consulted and Agree with Plan of Care Patient           Patient will benefit from skilled therapeutic intervention in order to improve the following deficits and impairments:  Decreased activity tolerance, Decreased range of motion, Decreased strength, Increased muscle spasms, Impaired UE functional use, Postural dysfunction, Improper body mechanics, Pain  Visit Diagnosis: Posture abnormality  Chronic left shoulder pain  Muscle weakness (generalized)     Problem List Patient Active Problem List   Diagnosis Date Noted  . Perforated tympanic membrane, left 10/06/2018  . Encounter for general adult medical examination with abnormal findings 07/13/2018  . Chronic midline low back pain with left-sided sciatica 06/13/2018  . Venous insufficiency of both lower extremities 06/13/2018  . Vitamin D deficiency 03/13/2016  . Abnormal Pap smear of cervix 12/25/2013  . Cervical stenosis (uterine cervix) 12/25/2013  . Endometrial polyp 12/25/2013  . Fibroids 12/25/2013  . Allergic rhinitis 06/20/2008  . Breast nodule 06/20/2008  . Family history of diabetes mellitus (DM) 06/20/2008  . FH: colon  cancer 06/20/2008  . Knee pain 06/20/2008    Theresa Johnson ,PT,DPT 02/27/2020, 2:26 PM  [facility] Physical Therapy [address] [place], [place], [address] Phone: [phone]   Fax:  [phone]  Name: Theresa Johnson MRN:  [id number] Date of Birth: 02/10/1971

## 2020-02-29 ENCOUNTER — Other Ambulatory Visit: Payer: Self-pay

## 2020-02-29 ENCOUNTER — Ambulatory Visit (INDEPENDENT_AMBULATORY_CARE_PROVIDER_SITE_OTHER): Payer: BC Managed Care – PPO | Admitting: Physical Therapy

## 2020-02-29 ENCOUNTER — Ambulatory Visit: Payer: BC Managed Care – PPO | Admitting: Family Medicine

## 2020-02-29 DIAGNOSIS — M6281 Muscle weakness (generalized): Secondary | ICD-10-CM

## 2020-02-29 DIAGNOSIS — G8929 Other chronic pain: Secondary | ICD-10-CM | POA: Diagnosis not present

## 2020-02-29 DIAGNOSIS — M25512 Pain in left shoulder: Secondary | ICD-10-CM | POA: Diagnosis not present

## 2020-02-29 DIAGNOSIS — R293 Abnormal posture: Secondary | ICD-10-CM | POA: Diagnosis not present

## 2020-02-29 NOTE — Therapy (Signed)
[facility] Therapy [address] [place], [place], [address] Phone: [phone]   Fax:  [phone]  Physical Therapy Treatment  Patient Details  Name: Theresa Johnson MRN: [id number] Date of Birth: [birth year] Referring Provider (PT): Gregor Hams   Encounter Date: 02/29/2020   PT End of Session - 02/29/20 1135    Visit Number 5    Number of Visits 12    Date for PT Re-Evaluation 03/29/20    PT Start Time 1100    PT Stop Time 1145    PT Time Calculation (min) 45 min    Activity Tolerance Patient tolerated treatment well;No increased pain    Behavior During Therapy [facility] for tasks assessed/performed           No past medical history on file.  Past Surgical History:  Procedure Laterality Date  . BREAST BIOPSY      There were no vitals filed for this visit.   Subjective Assessment - 02/29/20 1118    Subjective relays feeling good today not having pain.    Limitations Sitting;Writing;Reading    How long can you sit comfortably? Up to 30 minutes maybe    How long can you stand comfortably? Limited by plantar fasciitis    How long can you walk comfortably? Limited by plantar fasciitis    Patient Stated Goals Get rid of upper trapezius pain and be able to work without limitations.    Pain Onset More than a month ago            [facility] Adult PT Treatment/Exercise - 02/29/20 0001      Neck Exercises: Machines for Strengthening   UBE (Upper Arm Bike) 5 min retro L3    Cybex Row 25 lbs 2X15    Cybex Chest Press 15 lbs 2X10    Lat Pull 25 lbs 2x15      Neck Exercises: Theraband   Shoulder Extension 20 reps;Green    Rows Green;20 reps    Shoulder External Rotation Limitations 2 sets of 10 bilat with red (slow eccentrics)    Horizontal ABduction 20 reps;Red      Neck Exercises: Standing   Other Standing Exercises neck isometrics pushing into ball on wall for cervical flexion, extension, then sidebending against her hand isometrics all  10 reps ea holding for 5 sec       Cryotherapy   Number Minutes Cryotherapy 7 Minutes    Cryotherapy Location Shoulder;Cervical    Type of Cryotherapy Ice pack                    PT Short Term Goals - 02/15/20 1609      PT SHORT TERM GOAL #1   Title [place]ne will be independent and compliant with her starter HEP.    Baseline No HEP.    Time 2    Period Weeks    Status Achieved    Target Date 02/16/20             PT Long Term Goals - 02/29/20 1139      PT LONG TERM GOAL #1   Title F[place]a will report L upper trapezius, scapular and rhomboid pain as consistently < 2/10 on the Numeric Pain Rating Scale.    Baseline 5/10, has had less than 2 now for last 2 weeks    Time 6    Period Weeks    Status On-going      PT LONG TERM GOAL #2   Title Improve cervical  AROM for extension to 65; lateral bending to 30/30 and rotation to 60/60 degrees.    Baseline 55; 30/15; 50/60    Time 6    Period Weeks    Status On-going      PT LONG TERM GOAL #3   Title Improve cervical strength for extension and lateral bending to 5/5    Baseline 4- to 4/5    Time 6    Period Weeks    Status On-going      PT LONG TERM GOAL #4   Title [place] will return to work without being limited by L scapular or muscle pain.    Baseline Poor work endurance necessitates breaks.    Time 6    Period Weeks    Status On-going      PT LONG TERM GOAL #5   Title Dustee will be independent with her HEP at DC.    Baseline No HEP.    Time 6    Period Weeks    Status On-going                 Plan - 02/29/20 1136    Clinical Impression Statement She has been doing very well over the last 2 weeks with overall pain. She was able to progress resistance today with strength training without pain. Posture overall slowly improving with PT    Examination-Activity Limitations Sit;Sleep;Other   Computer work and reading are limited   Hydrologist Stable/Uncomplicated    Rehab Potential Good    PT Frequency 2x / week    PT Duration 6 weeks    PT Treatment/Interventions ADLs/Self Care Home Management;Moist Heat;Iontophoresis 4mg /ml Dexamethasone;Electrical Stimulation;Cryotherapy;Therapeutic activities;Functional mobility training;Therapeutic exercise;Neuromuscular re-education;Patient/family education;Manual techniques;Dry needling    PT Next Visit Plan Postural and body mechanics work along with cervical, scapular and upper back strengthening.    PT Home Exercise Plan See patient instructions.    Consulted and Agree with Plan of Care Patient           Patient will benefit from skilled therapeutic intervention in order to improve the following deficits and impairments:  Decreased activity tolerance, Decreased range of motion, Decreased strength, Increased muscle spasms, Impaired UE functional use, Postural dysfunction, Improper body mechanics, Pain  Visit Diagnosis: Posture abnormality  Chronic left shoulder pain  Muscle weakness (generalized)     Problem List Patient Active Problem List   Diagnosis Date Noted  . Perforated tympanic membrane, left 10/06/2018  . Encounter for general adult medical examination with abnormal findings 07/13/2018  . Chronic midline low back pain with left-sided sciatica 06/13/2018  . Venous insufficiency of both lower extremities 06/13/2018  . Vitamin D deficiency 03/13/2016  . Abnormal Pap smear of cervix 12/25/2013  . Cervical stenosis (uterine cervix) 12/25/2013  . Endometrial polyp 12/25/2013  . Fibroids 12/25/2013  . Allergic rhinitis 06/20/2008  . Breast nodule 06/20/2008  . Family history of diabetes mellitus (DM) 06/20/2008  . FH: colon cancer 06/20/2008  . Knee pain 06/20/2008    Silvestre Mesi 02/29/2020, 11:44 AM  [facility] Physical Therapy [address] [place], [place], [address] Phone: [phone]   Fax:   [phone]  Name: Theresa Johnson MRN: [id number] Date of Birth: 10-31-1970

## 2020-03-04 ENCOUNTER — Encounter: Payer: BC Managed Care – PPO | Admitting: Physical Therapy

## 2020-03-06 ENCOUNTER — Other Ambulatory Visit: Payer: Self-pay

## 2020-03-06 ENCOUNTER — Ambulatory Visit (INDEPENDENT_AMBULATORY_CARE_PROVIDER_SITE_OTHER): Payer: BC Managed Care – PPO | Admitting: Physical Therapy

## 2020-03-06 ENCOUNTER — Encounter: Payer: Self-pay | Admitting: Physical Therapy

## 2020-03-06 DIAGNOSIS — R293 Abnormal posture: Secondary | ICD-10-CM

## 2020-03-06 DIAGNOSIS — M6281 Muscle weakness (generalized): Secondary | ICD-10-CM

## 2020-03-06 DIAGNOSIS — G8929 Other chronic pain: Secondary | ICD-10-CM

## 2020-03-06 DIAGNOSIS — M25512 Pain in left shoulder: Secondary | ICD-10-CM | POA: Diagnosis not present

## 2020-03-06 NOTE — Therapy (Signed)
[facility] Physical Therapy [address] [place], [place], [address] Phone: [phone]   Fax:  [phone]  Physical Therapy Treatment  Patient Details  Name: Theresa Johnson MRN: [id number] Date of Birth: Jan 14, 1971 Referring Provider (PT): Gregor Hams   Encounter Date: 03/06/2020   PT End of Session - 03/06/20 1046    Visit Number 6    Number of Visits 12    Date for PT Re-Evaluation 03/29/20    PT Start Time 9528    PT Stop Time 4132    PT Time Calculation (min) 38 min    Activity Tolerance Patient tolerated treatment well;No increased pain    Behavior During Therapy [facility] for tasks assessed/performed           History reviewed. No pertinent past medical history.  Past Surgical History:  Procedure Laterality Date  . BREAST BIOPSY      There were no vitals filed for this visit.   Subjective Assessment - 03/06/20 1016    Subjective relays overall her pain is better, feels she can manage her pain better with her HEP. Her theracane has been helping    Limitations Sitting;Writing;Reading    How long can you sit comfortably? Up to 30 minutes maybe    How long can you stand comfortably? Limited by plantar fasciitis    How long can you walk comfortably? Limited by plantar fasciitis    Patient Stated Goals Get rid of upper trapezius pain and be able to work without limitations.    Pain Onset More than a month ago              [facility] PT Assessment - 03/06/20 0001      Assessment   Medical Diagnosis L upper trapezius strain    Referring Provider (PT) Gregor Hams    Onset Date/Surgical Date 10/09/18      AROM   Cervical Extension 60    Cervical - Right Side Bend 35    Cervical - Left Side Bend 35    Cervical - Right Rotation 70    Cervical - Left Rotation 65      Strength   Cervical Extension 4+/5    Cervical - Right Side Bend 4+/5    Cervical - Left Side Bend 4+/5              [facility] Adult PT Treatment/Exercise - 03/06/20  0001      Neck Exercises: Machines for Strengthening   UBE (Upper Arm Bike) 2.5 min fwd, 2.5 min retro    Cybex Row 25 lbs 2X15    Cybex Chest Press 15 lbs 2X10    Lat Pull 25 lbs 2x15      Neck Exercises: Theraband   Shoulder Extension 20 reps;Green    Rows Green;20 reps    Shoulder External Rotation Limitations 2 sets of 10 bilat with red (slow eccentrics)    Horizontal ABduction 20 reps;Red      Neck Exercises: Standing   Other Standing Exercises neck isometrics pushing into ball on wall for cervical flexion, extension, then sidebending against her hand isometrics all 10 reps ea holding for 5 sec                     PT Short Term Goals - 02/15/20 1609      PT SHORT TERM GOAL #1   Title Theresa Johnson will be independent and compliant with her starter HEP.    Baseline No HEP.  Time 2    Period Weeks    Status Achieved    Target Date 02/16/20             PT Long Term Goals - 02/29/20 1139      PT LONG TERM GOAL #1   Title Theresa Johnson will report L upper trapezius, scapular and rhomboid pain as consistently < 2/10 on the Numeric Pain Rating Scale.    Baseline 5/10, has had less than 2 now for last 2 weeks    Time 6    Period Weeks    Status On-going      PT LONG TERM GOAL #2   Title Improve cervical AROM for extension to 65; lateral bending to 30/30 and rotation to 60/60 degrees.    Baseline 55; 30/15; 50/60    Time 6    Period Weeks    Status On-going      PT LONG TERM GOAL #3   Title Improve cervical strength for extension and lateral bending to 5/5    Baseline 4- to 4/5    Time 6    Period Weeks    Status On-going      PT LONG TERM GOAL #4   Title Theresa Johnson will return to work without being limited by L scapular or muscle pain.    Baseline Poor work endurance necessitates breaks.    Time 6    Period Weeks    Status On-going      PT LONG TERM GOAL #5   Title Theresa Johnson will be independent with her HEP at DC.    Baseline No HEP.    Time 6    Period  Weeks    Status On-going                 Plan - 03/06/20 1048    Clinical Impression Statement She had noted improvments in cervical ROM and strength today, see updated meausements above. She is responding well to her strength, ROM, and postural corrective exercises.    Examination-Activity Limitations Sit;Sleep;Other   Computer work and reading are limited   Designer, jewellery Stable/Uncomplicated    Rehab Potential Good    PT Frequency 2x / week    PT Duration 6 weeks    PT Treatment/Interventions ADLs/Self Care Home Management;Moist Heat;Iontophoresis 4mg /ml Dexamethasone;Electrical Stimulation;Cryotherapy;Therapeutic activities;Functional mobility training;Therapeutic exercise;Neuromuscular re-education;Patient/family education;Manual techniques;Dry needling    PT Next Visit Plan Postural and body mechanics work along with cervical, scapular and upper back strengthening.    PT Home Exercise Plan See patient instructions.    Consulted and Agree with Plan of Care Patient           Patient will benefit from skilled therapeutic intervention in order to improve the following deficits and impairments:  Decreased activity tolerance, Decreased range of motion, Decreased strength, Increased muscle spasms, Impaired UE functional use, Postural dysfunction, Improper body mechanics, Pain  Visit Diagnosis: Posture abnormality  Chronic left shoulder pain  Muscle weakness (generalized)     Problem List Patient Active Problem List   Diagnosis Date Noted  . Perforated tympanic membrane, left 10/06/2018  . Encounter for general adult medical examination with abnormal findings 07/13/2018  . Chronic midline low back pain with left-sided sciatica 06/13/2018  . Venous insufficiency of both lower extremities 06/13/2018  . Vitamin D deficiency 03/13/2016  . Abnormal Pap smear of cervix 12/25/2013  . Cervical stenosis  (uterine cervix) 12/25/2013  . Endometrial polyp 12/25/2013  . Fibroids 12/25/2013  .  Allergic rhinitis 06/20/2008  . Breast nodule 06/20/2008  . Family history of diabetes mellitus (DM) 06/20/2008  . FH: colon cancer 06/20/2008  . Knee pain 06/20/2008    Theresa Johnson 03/06/2020, 10:51 AM  [facility] Physical Therapy [address] [place], [place], [address] Phone: [phone]   Fax:  [phone]  Name: FATIMAH SUNDQUIST MRN: [id number] Date of Birth: 1971/05/13

## 2020-03-13 ENCOUNTER — Other Ambulatory Visit: Payer: Self-pay

## 2020-03-13 ENCOUNTER — Ambulatory Visit (INDEPENDENT_AMBULATORY_CARE_PROVIDER_SITE_OTHER): Payer: BC Managed Care – PPO | Admitting: Physical Therapy

## 2020-03-13 DIAGNOSIS — R293 Abnormal posture: Secondary | ICD-10-CM

## 2020-03-13 DIAGNOSIS — M25512 Pain in left shoulder: Secondary | ICD-10-CM

## 2020-03-13 DIAGNOSIS — G8929 Other chronic pain: Secondary | ICD-10-CM

## 2020-03-13 DIAGNOSIS — M6281 Muscle weakness (generalized): Secondary | ICD-10-CM

## 2020-03-13 NOTE — Therapy (Signed)
[facility] Physical Therapy [address] [place], [place], [address] Phone: [phone]   Fax:  [phone]  Physical Therapy Treatment  Patient Details  Name: Theresa Johnson MRN: [id number] Date of Birth: Sep 20, 1970 Referring Provider (PT): Gregor Hams   Encounter Date: 03/13/2020   PT End of Session - 03/13/20 1113    Visit Number 7    Number of Visits 12    Date for PT Re-Evaluation 03/29/20    PT Start Time 1010    PT Stop Time 1050    PT Time Calculation (min) 40 min    Activity Tolerance Patient tolerated treatment well;No increased pain    Behavior During Therapy [facility] for tasks assessed/performed           No past medical history on file.  Past Surgical History:  Procedure Laterality Date  . BREAST BIOPSY      There were no vitals filed for this visit.   Subjective Assessment - 03/13/20 1024    Subjective Relays she slacked off on exercises so pain has come back some and tightness has returned. Its not that bad today. She was encouraged to consistently perform HEP    Limitations Sitting;Writing;Reading    How long can you sit comfortably? Up to 30 minutes maybe    How long can you stand comfortably? Limited by plantar fasciitis    How long can you walk comfortably? Limited by plantar fasciitis    Patient Stated Goals Get rid of upper trapezius pain and be able to work without limitations.    Currently in Pain? Yes    Pain Score 2     Pain Location Shoulder   and neck   Pain Orientation Left    Pain Onset More than a month ago              [facility] Adult PT Treatment/Exercise - 03/13/20 0001      Neck Exercises: Machines for Strengthening   UBE (Upper Arm Bike) 2.5 min fwd, 2.5 min retro, L3    Cybex Row 25 lbs 2X15    Cybex Chest Press 15 lbs 2X10    Lat Pull 25 lbs 2x15      Neck Exercises: Theraband   Shoulder Extension 20 reps;Green    Rows Green;20 reps    Shoulder External Rotation Limitations 2 sets of 10 bilat  with red (slow eccentrics)    Horizontal ABduction 20 reps;Red      Neck Exercises: Seated   Other Seated Exercise neck isometrics 5 sec X 10 reps for bilat sidebending, flexion, extension    Other Seated Exercise neck rotation stretch 5 sec X 10 bilat      Manual Therapy   Manual therapy comments STM/T.P release with biofreeze  to Lt upper trap, mid trap, cervical P.S, levator      Neck Exercises: Stretches   Upper Trapezius Stretch Left;3 reps;30 seconds   2 reps on Rt                   PT Short Term Goals - 02/15/20 1609      PT SHORT TERM GOAL #1   Title Theresa Johnson will be independent and compliant with her starter HEP.    Baseline No HEP.    Time 2    Period Weeks    Status Achieved    Target Date 02/16/20             PT Long Term Goals - 02/29/20 1139  PT LONG TERM GOAL #1   Title Theresa Johnson will report L upper trapezius, scapular and rhomboid pain as consistently < 2/10 on the Numeric Pain Rating Scale.    Baseline 5/10, has had less than 2 now for last 2 weeks    Time 6    Period Weeks    Status On-going      PT LONG TERM GOAL #2   Title Improve cervical AROM for extension to 65; lateral bending to 30/30 and rotation to 60/60 degrees.    Baseline 55; 30/15; 50/60    Time 6    Period Weeks    Status On-going      PT LONG TERM GOAL #3   Title Improve cervical strength for extension and lateral bending to 5/5    Baseline 4- to 4/5    Time 6    Period Weeks    Status On-going      PT LONG TERM GOAL #4   Title Theresa Johnson will return to work without being limited by L scapular or muscle pain.    Baseline Poor work endurance necessitates breaks.    Time 6    Period Weeks    Status On-going      PT LONG TERM GOAL #5   Title Theresa Johnson will be independent with her HEP at DC.    Baseline No HEP.    Time 6    Period Weeks    Status On-going                 Plan - 03/13/20 1113    Clinical Impression Statement She continues to do well with  PT, has regressed slightly from not performing HEP, but relays she will try to make this routine now. Session continued to focus on cervical-thoracic strengthening and ROM as tolerated. Added manual therapy today for STM today to reduce overall pain and tightness.    Examination-Activity Limitations Sit;Sleep;Other   Computer work and reading are limited   Designer, jewellery Stable/Uncomplicated    Rehab Potential Good    PT Frequency 2x / week    PT Duration 6 weeks    PT Treatment/Interventions ADLs/Self Care Home Management;Moist Heat;Iontophoresis 4mg /ml Dexamethasone;Electrical Stimulation;Cryotherapy;Therapeutic activities;Functional mobility training;Therapeutic exercise;Neuromuscular re-education;Patient/family education;Manual techniques;Dry needling    PT Next Visit Plan Postural and body mechanics work along with cervical, scapular and upper back strengthening.    PT Home Exercise Plan See patient instructions.    Consulted and Agree with Plan of Care Patient           Patient will benefit from skilled therapeutic intervention in order to improve the following deficits and impairments:  Decreased activity tolerance, Decreased range of motion, Decreased strength, Increased muscle spasms, Impaired UE functional use, Postural dysfunction, Improper body mechanics, Pain  Visit Diagnosis: Posture abnormality  Chronic left shoulder pain  Muscle weakness (generalized)     Problem List Patient Active Problem List   Diagnosis Date Noted  . Perforated tympanic membrane, left 10/06/2018  . Encounter for general adult medical examination with abnormal findings 07/13/2018  . Chronic midline low back pain with left-sided sciatica 06/13/2018  . Venous insufficiency of both lower extremities 06/13/2018  . Vitamin D deficiency 03/13/2016  . Abnormal Pap smear of cervix 12/25/2013  . Cervical stenosis (uterine cervix)  12/25/2013  . Endometrial polyp 12/25/2013  . Fibroids 12/25/2013  . Allergic rhinitis 06/20/2008  . Breast nodule 06/20/2008  . Family history of diabetes mellitus (DM) 06/20/2008  .  FH: colon cancer 06/20/2008  . Knee pain 06/20/2008    Silvestre Mesi 03/13/2020, 11:16 AM  [facility] [address] [place], [place], [address] Phone: [phone]   Fax:  [phone]  Name: Theresa Johnson MRN: [id number] Date of Birth: 1970-10-17

## 2020-03-15 ENCOUNTER — Other Ambulatory Visit: Payer: Self-pay

## 2020-03-15 ENCOUNTER — Ambulatory Visit (INDEPENDENT_AMBULATORY_CARE_PROVIDER_SITE_OTHER): Payer: BC Managed Care – PPO | Admitting: Physical Therapy

## 2020-03-15 DIAGNOSIS — M6281 Muscle weakness (generalized): Secondary | ICD-10-CM

## 2020-03-15 DIAGNOSIS — G8929 Other chronic pain: Secondary | ICD-10-CM

## 2020-03-15 DIAGNOSIS — M25512 Pain in left shoulder: Secondary | ICD-10-CM

## 2020-03-15 DIAGNOSIS — R293 Abnormal posture: Secondary | ICD-10-CM

## 2020-03-15 NOTE — Therapy (Signed)
[facility] Physical Therapy [address] [place], [place], [address] Phone: [phone]   Fax:  [phone]  Physical Therapy Treatment  Patient Details  Name: Theresa Johnson MRN: [id number] Date of Birth: 1971/05/17 Referring Provider (PT): Gregor Hams   Encounter Date: 03/15/2020   PT End of Session - 03/15/20 1054    Visit Number 8    Number of Visits 12    Date for PT Re-Evaluation 03/29/20    PT Start Time 1010    PT Stop Time 1050    PT Time Calculation (min) 40 min    Activity Tolerance Patient tolerated treatment well;No increased pain    Behavior During Therapy [facility] for tasks assessed/performed           No past medical history on file.  Past Surgical History:  Procedure Laterality Date   BREAST BIOPSY      There were no vitals filed for this visit.   Subjective Assessment - 03/15/20 1042    Subjective She relays some soreness after last time but not its better. Not really having pain today    Limitations Sitting;Writing;Reading    How long can you sit comfortably? Up to 30 minutes maybe    How long can you stand comfortably? Limited by plantar fasciitis    How long can you walk comfortably? Limited by plantar fasciitis    Patient Stated Goals Get rid of upper trapezius pain and be able to work without limitations.    Pain Onset More than a month ago            [facility] Adult PT Treatment/Exercise - 03/15/20 0001      Neck Exercises: Machines for Strengthening   UBE (Upper Arm Bike) 2.5 min fwd, 2.5 min retro, L3    Cybex Row 25 lbs 2X15    Cybex Chest Press 15 lbs 2X10    Lat Pull 25 lbs X 20      Neck Exercises: Theraband   Shoulder Extension 20 reps;Green    Rows Green;20 reps    Shoulder External Rotation Limitations 2 sets of 10 bilat with red (slow eccentrics)    Horizontal ABduction 20 reps;Red      Neck Exercises: Standing   Other Standing Exercises bilat shoulder flexion to end range with stretch at top using 3  lb bar 2X10    Other Standing Exercises OH shoulder stability rolls X 10 each plane 2 # ball      Neck Exercises: Seated   Other Seated Exercise neck isometrics flexion and ext 5 sec X 5 reps    Other Seated Exercise neck rotation stretch 5 sec X 5 bilat      Neck Exercises: Stretches   Upper Trapezius Stretch Left;Right;2 reps;30 seconds    Levator Stretch Right;Left;2 reps;30 seconds                    PT Short Term Goals - 02/15/20 1609      PT SHORT TERM GOAL #1   Title Rosena will be independent and compliant with her starter HEP.    Baseline No HEP.    Time 2    Period Weeks    Status Achieved    Target Date 02/16/20             PT Long Term Goals - 02/29/20 1139      PT LONG TERM GOAL #1   Title Eurydice will report L upper trapezius, scapular and rhomboid pain as consistently <  2/10 on the Numeric Pain Rating Scale.    Baseline 5/10, has had less than 2 now for last 2 weeks    Time 6    Period Weeks    Status On-going      PT LONG TERM GOAL #2   Title Improve cervical AROM for extension to 65; lateral bending to 30/30 and rotation to 60/60 degrees.    Baseline 55; 30/15; 50/60    Time 6    Period Weeks    Status On-going      PT LONG TERM GOAL #3   Title Improve cervical strength for extension and lateral bending to 5/5    Baseline 4- to 4/5    Time 6    Period Weeks    Status On-going      PT LONG TERM GOAL #4   Title Hanalei will return to work without being limited by L scapular or muscle pain.    Baseline Poor work endurance necessitates breaks.    Time 6    Period Weeks    Status On-going      PT LONG TERM GOAL #5   Title Chabely will be independent with her HEP at DC.    Baseline No HEP.    Time 6    Period Weeks    Status On-going                 Plan - 03/15/20 1055    Clinical Impression Statement Continued with posture and strengthening today. Some fatigue reported but no other complaints. She is progressing well  with PT and shows great effort. Continue POC    PT Next Visit Plan Postural and body mechanics work along with cervical, scapular and upper back strengthening.    Consulted and Agree with Plan of Care Patient           Patient will benefit from skilled therapeutic intervention in order to improve the following deficits and impairments:     Visit Diagnosis: Posture abnormality  Chronic left shoulder pain  Muscle weakness (generalized)     Problem List Patient Active Problem List   Diagnosis Date Noted   Perforated tympanic membrane, left 10/06/2018   Encounter for general adult medical examination with abnormal findings 07/13/2018   Chronic midline low back pain with left-sided sciatica 06/13/2018   Venous insufficiency of both lower extremities 06/13/2018   Vitamin D deficiency 03/13/2016   Abnormal Pap smear of cervix 12/25/2013   Cervical stenosis (uterine cervix) 12/25/2013   Endometrial polyp 12/25/2013   Fibroids 12/25/2013   Allergic rhinitis 06/20/2008   Breast nodule 06/20/2008   Family history of diabetes mellitus (DM) 06/20/2008   FH: colon cancer 06/20/2008   Knee pain 06/20/2008    Silvestre Mesi 03/15/2020, 10:57 AM  [facility] Physical Therapy [address] [place], [place], [address] Phone: [phone]   Fax:  [phone]  Name: JOYEL CHENETTE MRN: [id number] Date of Birth: [birth year]

## 2020-03-19 ENCOUNTER — Encounter: Payer: Self-pay | Admitting: Rehabilitative and Restorative Service Providers"

## 2020-03-19 ENCOUNTER — Ambulatory Visit (INDEPENDENT_AMBULATORY_CARE_PROVIDER_SITE_OTHER): Payer: BC Managed Care – PPO | Admitting: Rehabilitative and Restorative Service Providers"

## 2020-03-19 ENCOUNTER — Other Ambulatory Visit: Payer: Self-pay

## 2020-03-19 DIAGNOSIS — M25512 Pain in left shoulder: Secondary | ICD-10-CM

## 2020-03-19 DIAGNOSIS — M6281 Muscle weakness (generalized): Secondary | ICD-10-CM | POA: Diagnosis not present

## 2020-03-19 DIAGNOSIS — G8929 Other chronic pain: Secondary | ICD-10-CM | POA: Diagnosis not present

## 2020-03-19 DIAGNOSIS — R293 Abnormal posture: Secondary | ICD-10-CM

## 2020-03-19 NOTE — Therapy (Signed)
[facility] Physical Therapy [address] [place], [place], [address] Phone: [phone]   Fax:  [phone]  Physical Therapy Treatment  Patient Details  Name: Theresa Johnson MRN: [id number] Date of Birth: 12-22-1970 Referring Provider (PT): Gregor Hams   Encounter Date: 03/19/2020   PT End of Session - 03/19/20 1026    Visit Number 9    Number of Visits 12    Date for PT Re-Evaluation 03/29/20    PT Start Time 1018    PT Stop Time 1057    PT Time Calculation (min) 39 min    Activity Tolerance Patient tolerated treatment well;No increased pain    Behavior During Therapy [facility] for tasks assessed/performed           History reviewed. No pertinent past medical history.  Past Surgical History:  Procedure Laterality Date  . BREAST BIOPSY      There were no vitals filed for this visit.   Subjective Assessment - 03/19/20 1026    Subjective Pt. indicated feeling up to 4/10 after driving extended since last visit.  Pt. stated no pain really upon arrival today.    Limitations Sitting;Writing;Reading    How long can you sit comfortably? Up to 30 minutes maybe    How long can you stand comfortably? Limited by plantar fasciitis    How long can you walk comfortably? Limited by plantar fasciitis    Patient Stated Goals Get rid of upper trapezius pain and be able to work without limitations.    Currently in Pain? No/denies    Pain Score 0-No pain    Pain Onset More than a month ago                             [facility] Adult PT Treatment/Exercise - 03/19/20 0001      Neck Exercises: Machines for Strengthening   UBE (Upper Arm Bike) 3 mins fwd/back each way lvl 3.0    Cybex Row 25 lbs 2X15    Cybex Chest Press 20 lbs 3 x 10    Lat Pull 25 lbs 2 x 15      Neck Exercises: Theraband   Shoulder Extension 20 reps;Green    Rows Green;20 reps    Shoulder External Rotation 20 reps;Green      Neck Exercises: Standing   Other Standing  Exercises OH shoulder stability rolls X 10 each plane 2 # ball                    PT Short Term Goals - 02/15/20 1609      PT SHORT TERM GOAL #1   Title [name] will be independent and compliant with her starter HEP.    Baseline No HEP.    Time 2    Period Weeks    Status Achieved    Target Date 02/16/20             PT Long Term Goals - 02/29/20 1139      PT LONG TERM GOAL #1   Title [name] will report L upper trapezius, scapular and rhomboid pain as consistently < 2/10 on the Numeric Pain Rating Scale.    Baseline 5/10, has had less than 2 now for last 2 weeks    Time 6    Period Weeks    Status On-going      PT LONG TERM GOAL #2   Title Improve cervical AROM for extension to  65; lateral bending to 30/30 and rotation to 60/60 degrees.    Baseline 55; 30/15; 50/60    Time 6    Period Weeks    Status On-going      PT LONG TERM GOAL #3   Title Improve cervical strength for extension and lateral bending to 5/5    Baseline 4- to 4/5    Time 6    Period Weeks    Status On-going      PT LONG TERM GOAL #4   Title [name] will return to work without being limited by L scapular or muscle pain.    Baseline Poor work endurance necessitates breaks.    Time 6    Period Weeks    Status On-going      PT LONG TERM GOAL #5   Title [name] will be independent with her HEP at DC.    Baseline No HEP.    Time 6    Period Weeks    Status On-going                 Plan - 03/19/20 1050    Clinical Impression Statement Pt. continued to show improvement in self management of symptoms c HEP.  Symptoms are present at times c prolonged sitting/driving activity but Pt. reduction of symptoms c HEP.    PT Next Visit Plan Transitioning to HEP as appropriate.    Consulted and Agree with Plan of Care Patient           Patient will benefit from skilled therapeutic intervention in order to improve the following deficits and impairments:     Visit Diagnosis: Posture  abnormality  Chronic left shoulder pain  Muscle weakness (generalized)     Problem List Patient Active Problem List   Diagnosis Date Noted  . Perforated tympanic membrane, left 10/06/2018  . Encounter for general adult medical examination with abnormal findings 07/13/2018  . Chronic midline low back pain with left-sided sciatica 06/13/2018  . Venous insufficiency of both lower extremities 06/13/2018  . Vitamin D deficiency 03/13/2016  . Abnormal Pap smear of cervix 12/25/2013  . Cervical stenosis (uterine cervix) 12/25/2013  . Endometrial polyp 12/25/2013  . Fibroids 12/25/2013  . Allergic rhinitis 06/20/2008  . Breast nodule 06/20/2008  . Family history of diabetes mellitus (DM) 06/20/2008  . FH: colon cancer 06/20/2008  . Knee pain 06/20/2008    Scot Jun, PT, DPT, OCS, ATC 03/19/20  10:52 AM    [facility] Physical Therapy [address] [place], [place], [address] Phone: [phone]   Fax:  [phone]  Name: Theresa Johnson MRN: [id number] Date of Birth: [birth year]

## 2020-03-21 ENCOUNTER — Encounter: Payer: Self-pay | Admitting: Rehabilitative and Restorative Service Providers"

## 2020-03-21 ENCOUNTER — Ambulatory Visit (INDEPENDENT_AMBULATORY_CARE_PROVIDER_SITE_OTHER): Payer: BC Managed Care – PPO | Admitting: Rehabilitative and Restorative Service Providers"

## 2020-03-21 ENCOUNTER — Other Ambulatory Visit: Payer: Self-pay

## 2020-03-21 DIAGNOSIS — G8929 Other chronic pain: Secondary | ICD-10-CM | POA: Diagnosis not present

## 2020-03-21 DIAGNOSIS — M25512 Pain in left shoulder: Secondary | ICD-10-CM

## 2020-03-21 DIAGNOSIS — R293 Abnormal posture: Secondary | ICD-10-CM

## 2020-03-21 DIAGNOSIS — M6281 Muscle weakness (generalized): Secondary | ICD-10-CM

## 2020-03-21 NOTE — Therapy (Signed)
[facility] [address] [place], [place], [address] Phone: [phone]   Fax:  [phone]  Physical Therapy Treatment  Patient Details  Name: Theresa Johnson MRN: [id number] Date of Birth: February 27, 1971 Referring Provider (PT): Gregor Hams   Encounter Date: 03/21/2020   PT End of Session - 03/21/20 1017    Visit Number 10    Number of Visits 12    Date for PT Re-Evaluation 03/29/20    PT Start Time 7169    PT Stop Time 1055    PT Time Calculation (min) 40 min    Activity Tolerance Patient tolerated treatment well;No increased pain    Behavior During Therapy [facility] for tasks assessed/performed           History reviewed. No pertinent past medical history.  Past Surgical History:  Procedure Laterality Date  . BREAST BIOPSY      There were no vitals filed for this visit.   Subjective Assessment - 03/21/20 1017    Subjective Pt. indicated some tightness overall but did some exercises that helped.    Limitations Sitting;Writing;Reading    How long can you sit comfortably? Up to 30 minutes maybe    How long can you stand comfortably? Limited by plantar fasciitis    How long can you walk comfortably? Limited by plantar fasciitis    Patient Stated Goals Get rid of upper trapezius pain and be able to work without limitations.    Currently in Pain? No/denies    Pain Score 0-No pain    Pain Onset More than a month ago                             [facility] Adult PT Treatment/Exercise - 03/21/20 0001      Exercises   Exercises Other Exercises    Other Exercises  Additional time spent on review of HEP plan      Neck Exercises: Machines for Strengthening   UBE (Upper Arm Bike) 3 mins fwd/back lvl 3.0    Cybex Chest Press 20 lbs 3 x 10      Neck Exercises: Theraband   Shoulder Extension Blue   3 x 15   Rows Blue   3 x 15   Shoulder External Rotation Blue   3 x 15 bilateral   Horizontal ABduction Other (comment)    blue supine 3 x 15     Neck Exercises: Standing   Other Standing Exercises OH shoulder stability rolls X 10 each plane 2 # ball      Neck Exercises: Supine   Other Supine Exercise Supine retraction 5 sec hold x 10                    PT Short Term Goals - 02/15/20 1609      PT SHORT TERM GOAL #1   Title Kimyata will be independent and compliant with her starter HEP.    Baseline No HEP.    Time 2    Period Weeks    Status Achieved    Target Date 02/16/20             PT Long Term Goals - 02/29/20 1139      PT LONG TERM GOAL #1   Title Sophea will report L upper trapezius, scapular and rhomboid pain as consistently < 2/10 on the Numeric Pain Rating Scale.    Baseline 5/10, has had less than 2 now  for last 2 weeks    Time 6    Period Weeks    Status On-going      PT LONG TERM GOAL #2   Title Improve cervical AROM for extension to 65; lateral bending to 30/30 and rotation to 60/60 degrees.    Baseline 55; 30/15; 50/60    Time 6    Period Weeks    Status On-going      PT LONG TERM GOAL #3   Title Improve cervical strength for extension and lateral bending to 5/5    Baseline 4- to 4/5    Time 6    Period Weeks    Status On-going      PT LONG TERM GOAL #4   Title Courtne will return to work without being limited by L scapular or muscle pain.    Baseline Poor work endurance necessitates breaks.    Time 6    Period Weeks    Status On-going      PT LONG TERM GOAL #5   Title Lisanne will be independent with her HEP at DC.    Baseline No HEP.    Time 6    Period Weeks    Status On-going                 Plan - 03/21/20 1034    Clinical Impression Statement Extended review of HEP for self use for possible upcoming d/c.  Pt. continued to indicated overall reduction in pain symptoms to this point.  Pt. voiced concerns about her ability to adhere to plan of HEP long term due to loss of accountability without clinic visits.    Examination-Activity  Limitations Sit;Sleep;Other    Examination-Participation Restrictions Cleaning    Rehab Potential Good    PT Treatment/Interventions ADLs/Self Care Home Management;Moist Heat;Iontophoresis 4mg /ml Dexamethasone;Electrical Stimulation;Cryotherapy;Therapeutic activities;Functional mobility training;Therapeutic exercise;Neuromuscular re-education;Patient/family education;Manual techniques;Dry needling    PT Next Visit Plan Transitioning to HEP as appropriate.    Consulted and Agree with Plan of Care Patient           Patient will benefit from skilled therapeutic intervention in order to improve the following deficits and impairments:  Decreased activity tolerance, Decreased range of motion, Decreased strength, Increased muscle spasms, Impaired UE functional use, Postural dysfunction, Improper body mechanics, Pain  Visit Diagnosis: Chronic left shoulder pain  Muscle weakness (generalized)  Posture abnormality     Problem List Patient Active Problem List   Diagnosis Date Noted  . Perforated tympanic membrane, left 10/06/2018  . Encounter for general adult medical examination with abnormal findings 07/13/2018  . Chronic midline low back pain with left-sided sciatica 06/13/2018  . Venous insufficiency of both lower extremities 06/13/2018  . Vitamin D deficiency 03/13/2016  . Abnormal Pap smear of cervix 12/25/2013  . Cervical stenosis (uterine cervix) 12/25/2013  . Endometrial polyp 12/25/2013  . Fibroids 12/25/2013  . Allergic rhinitis 06/20/2008  . Breast nodule 06/20/2008  . Family history of diabetes mellitus (DM) 06/20/2008  . FH: colon cancer 06/20/2008  . Knee pain 06/20/2008    Scot Jun, PT, DPT, OCS, ATC 03/21/20  10:45 AM    [facility] [address] [place], [place], [address] Phone: [phone]   Fax:  [phone]  Name: Theresa Johnson MRN: [id number] Date of Birth: August 19, 1970

## 2020-03-26 ENCOUNTER — Other Ambulatory Visit: Payer: Self-pay

## 2020-03-26 ENCOUNTER — Ambulatory Visit (INDEPENDENT_AMBULATORY_CARE_PROVIDER_SITE_OTHER): Payer: BC Managed Care – PPO | Admitting: Rehabilitative and Restorative Service Providers"

## 2020-03-26 ENCOUNTER — Encounter: Payer: Self-pay | Admitting: Rehabilitative and Restorative Service Providers"

## 2020-03-26 DIAGNOSIS — R293 Abnormal posture: Secondary | ICD-10-CM | POA: Diagnosis not present

## 2020-03-26 DIAGNOSIS — M25512 Pain in left shoulder: Secondary | ICD-10-CM | POA: Diagnosis not present

## 2020-03-26 DIAGNOSIS — G8929 Other chronic pain: Secondary | ICD-10-CM

## 2020-03-26 DIAGNOSIS — M6281 Muscle weakness (generalized): Secondary | ICD-10-CM

## 2020-03-26 NOTE — Therapy (Signed)
[facility] [address] [place], [place], [address] Phone: [phone]   Fax:  [phone]  Physical Therapy Treatment/Discharge  Patient Details  Name: ADELI FROST MRN: [id number] Date of Birth: 1970/12/05 Referring Provider (PT): Gregor Hams   Encounter Date: 03/26/2020   PT End of Session - 03/26/20 1201    Visit Number 11    Number of Visits 12    Date for PT Re-Evaluation 03/29/20    PT Start Time 0258    PT Stop Time 1230    PT Time Calculation (min) 38 min    Activity Tolerance Patient tolerated treatment well;No increased pain    Behavior During Therapy [facility] for tasks assessed/performed           History reviewed. No pertinent past medical history.  Past Surgical History:  Procedure Laterality Date   BREAST BIOPSY      There were no vitals filed for this visit.   Subjective Assessment - 03/26/20 1157    Subjective Pt. indicated feeling good and confident in ability to perform HEP. Pt. stated it is annoying that she has to continue to treat it c HEP but feels like she can.  GROC +7    Limitations Sitting;Writing;Reading    How long can you sit comfortably? Up to 30 minutes maybe    How long can you stand comfortably? Limited by plantar fasciitis    How long can you walk comfortably? Limited by plantar fasciitis    Patient Stated Goals Get rid of upper trapezius pain and be able to work without limitations.    Currently in Pain? No/denies    Pain Score 0-No pain    Pain Onset More than a month ago              [facility] PT Assessment - 03/26/20 0001      Assessment   Medical Diagnosis L upper trapezius strain    Referring Provider (PT) Gregor Hams    Onset Date/Surgical Date 10/09/18      AROM   Cervical Flexion 75    Cervical Extension 60    Cervical - Right Rotation 75    Cervical - Left Rotation 75                         [facility] Adult PT Treatment/Exercise - 03/26/20 0001       Exercises   Other Exercises  Additional time spent on review of HEP plan      Neck Exercises: Machines for Strengthening   UBE (Upper Arm Bike) Lvl 3.0 5 mins fwd/back each way      Neck Exercises: Theraband   Shoulder Extension Blue   3 x 15   Rows Blue   3 x 15   Shoulder External Rotation Blue   3 x 10 bilateral   Shoulder Internal Rotation Blue   3 x 10     Neck Exercises: Seated   Other Seated Exercise neck isometrics flexion and ext 5 sec X 5 reps      Shoulder Exercises: Supine   Horizontal ABduction Strengthening   3 x 10   Theraband Level (Shoulder Horizontal ABduction) Level 3 (Green)                    PT Short Term Goals - 02/15/20 1609      PT SHORT TERM GOAL #1   Title Bhumi will be independent and compliant with her starter  HEP.    Baseline No HEP.    Time 2    Period Weeks    Status Achieved    Target Date 02/16/20             PT Long Term Goals - 03/26/20 1200      PT LONG TERM GOAL #1   Title Takenya will report L upper trapezius, scapular and rhomboid pain as consistently < 2/10 on the Numeric Pain Rating Scale.    Baseline 5/10, has had less than 2 now for last 2 weeks    Time 6    Period Weeks    Status Achieved      PT LONG TERM GOAL #2   Title Improve cervical AROM for extension to 65; lateral bending to 30/30 and rotation to 60/60 degrees.    Time 6    Period Weeks    Status Achieved      PT LONG TERM GOAL #3   Title Improve cervical strength for extension and lateral bending to 5/5    Time 6    Period Weeks    Status Achieved      PT LONG TERM GOAL #4   Title Crickett will return to work without being limited by L scapular or muscle pain.    Baseline Poor work endurance necessitates breaks.    Time 6    Period Weeks    Status Achieved      PT LONG TERM GOAL #5   Title Nohelia will be independent with her HEP at DC.    Baseline No HEP.    Time 6    Period Weeks    Status Achieved                 Plan -  03/26/20 1202    Clinical Impression Statement Pt. has attended 11 visits overall during course of treatment, rating GROC +7 and achieving established goals at this time.  See objective data for updated information.  Current presentation and Pt. acceptance of plan to D/C c HEP at this time.    Examination-Activity Limitations Sit;Sleep;Other    Examination-Participation Restrictions Cleaning    Rehab Potential Good    PT Treatment/Interventions ADLs/Self Care Home Management;Moist Heat;Iontophoresis 70m/ml Dexamethasone;Electrical Stimulation;Cryotherapy;Therapeutic activities;Functional mobility training;Therapeutic exercise;Neuromuscular re-education;Patient/family education;Manual techniques;Dry needling    PT Next Visit Plan D/C to HEP    Consulted and Agree with Plan of Care Patient           Patient will benefit from skilled therapeutic intervention in order to improve the following deficits and impairments:  Decreased activity tolerance, Decreased range of motion, Decreased strength, Increased muscle spasms, Impaired UE functional use, Postural dysfunction, Improper body mechanics, Pain  Visit Diagnosis: Chronic left shoulder pain  Muscle weakness (generalized)  Posture abnormality     Problem List Patient Active Problem List   Diagnosis Date Noted   Perforated tympanic membrane, left 10/06/2018   Encounter for general adult medical examination with abnormal findings 07/13/2018   Chronic midline low back pain with left-sided sciatica 06/13/2018   Venous insufficiency of both lower extremities 06/13/2018   Vitamin D deficiency 03/13/2016   Abnormal Pap smear of cervix 12/25/2013   Cervical stenosis (uterine cervix) 12/25/2013   Endometrial polyp 12/25/2013   Fibroids 12/25/2013   Allergic rhinitis 06/20/2008   Breast nodule 06/20/2008   Family history of diabetes mellitus (DM) 06/20/2008   FH: colon cancer 06/20/2008   Knee pain 06/20/2008    PHYSICAL  THERAPY DISCHARGE SUMMARY  Visits from Start of Care: 11  Current functional level related to goals / functional outcomes: See note   Remaining deficits: See note   Education / Equipment: HEP Plan: Patient agrees to discharge.  Patient goals were met. Patient is being discharged due to meeting the stated rehab goals.  ?????    Scot Jun, PT, DPT, OCS, ATC 03/26/20  12:19 PM    [facility] [address] [place], [place], [address] Phone: [phone]   Fax:  [phone]  Name: DELICIA BERENS MRN: [id number] Date of Birth: 10-03-1970

## 2020-03-28 ENCOUNTER — Encounter: Payer: BC Managed Care – PPO | Admitting: Rehabilitative and Restorative Service Providers"

## 2020-04-01 ENCOUNTER — Encounter: Payer: BC Managed Care – PPO | Admitting: Physical Therapy

## 2020-04-03 ENCOUNTER — Encounter: Payer: BC Managed Care – PPO | Admitting: Rehabilitative and Restorative Service Providers"

## 2020-04-04 ENCOUNTER — Encounter: Payer: Self-pay | Admitting: Family Medicine

## 2020-04-04 ENCOUNTER — Ambulatory Visit (INDEPENDENT_AMBULATORY_CARE_PROVIDER_SITE_OTHER): Payer: BC Managed Care – PPO | Admitting: Family Medicine

## 2020-04-04 ENCOUNTER — Other Ambulatory Visit: Payer: Self-pay

## 2020-04-04 VITALS — BP 130/90 | HR 82 | Ht 67.0 in | Wt 230.0 lb

## 2020-04-04 DIAGNOSIS — M62838 Other muscle spasm: Secondary | ICD-10-CM

## 2020-04-04 NOTE — Patient Instructions (Signed)
Thank you for coming in today.  Continue home exercise.  Ok to repeat PT in the future if needed.   Next steps if needed are likely MRI for further diagnosis and injection planning.

## 2020-04-04 NOTE — Progress Notes (Signed)
   I, Kandace Blitz, LAT, ATC, am serving as scribe for Dr. Lynne Leader.  Theresa Johnson is a 49 y.o. female who presents to [facility] today for f/u of neck and L shoulder/scapular pain.  She was last seen by Dr. Georgina Snell on 01/16/20 and was referred to PT of which she has completed 11 visits.  She has been d/c from PT.  Since her last visit, pt reports she significantly better but still having minimal intermittent pain.  She is able to do some exercises on her own taught by physical therapy to manage any subsequent pain.  Overall she is pretty happy with how things are going.  Diagnostic testing: C-spine XR- 01/18/20   Pertinent review of systems: No fevers or chills  Relevant historical information: Venous insufficiency   Exam:  BP 130/90 (BP Location: Left Arm, Patient Position: Sitting)   Pulse 82   Ht 5\' 7"  (1.702 m)   Wt 230 lb (104.3 kg)   SpO2 98%   BMI 36.02 kg/m  General: Well Developed, well nourished, and in no acute distress.   MSK: C-spine normal-appearing normal cervical motion.  Tender palpation left trapezius mildly.     Assessment and Plan: 49 y.o. female with neck and trapezius pain.  Significantly improved with physical therapy.  7 minimal symptoms but are quite manageable with home exercise program.  Plan to continue conservative management and recheck back as needed.  Happy to reorder PT in the future.  Next step if worsening or not improving would be MRI C-spine.    Discussed warning signs or symptoms. Please see discharge instructions. Patient expresses understanding.   The above documentation has been reviewed and is accurate and complete Lynne Leader, M.D.  Total encounter time 20 minutes including face-to-face time with the patient and charting on the date of service.

## 2020-04-23 DIAGNOSIS — Z1159 Encounter for screening for other viral diseases: Secondary | ICD-10-CM | POA: Diagnosis not present

## 2020-04-26 DIAGNOSIS — Z1211 Encounter for screening for malignant neoplasm of colon: Secondary | ICD-10-CM | POA: Diagnosis not present

## 2020-04-26 DIAGNOSIS — K648 Other hemorrhoids: Secondary | ICD-10-CM | POA: Diagnosis not present

## 2020-06-11 ENCOUNTER — Ambulatory Visit: Payer: BC Managed Care – PPO | Admitting: Family

## 2020-07-03 ENCOUNTER — Other Ambulatory Visit: Payer: Self-pay

## 2020-07-03 ENCOUNTER — Encounter: Payer: Self-pay | Admitting: Family

## 2020-07-03 ENCOUNTER — Ambulatory Visit: Payer: BC Managed Care – PPO | Admitting: Family

## 2020-07-03 VITALS — BP 130/82 | HR 90 | Temp 98.4°F | Ht 67.0 in | Wt 230.0 lb

## 2020-07-03 DIAGNOSIS — D649 Anemia, unspecified: Secondary | ICD-10-CM

## 2020-07-03 DIAGNOSIS — Z13220 Encounter for screening for lipoid disorders: Secondary | ICD-10-CM | POA: Diagnosis not present

## 2020-07-03 DIAGNOSIS — Z23 Encounter for immunization: Secondary | ICD-10-CM | POA: Diagnosis not present

## 2020-07-03 DIAGNOSIS — E559 Vitamin D deficiency, unspecified: Secondary | ICD-10-CM | POA: Diagnosis not present

## 2020-07-03 DIAGNOSIS — Z0000 Encounter for general adult medical examination without abnormal findings: Secondary | ICD-10-CM | POA: Diagnosis not present

## 2020-07-03 DIAGNOSIS — E669 Obesity, unspecified: Secondary | ICD-10-CM

## 2020-07-03 DIAGNOSIS — Z6836 Body mass index (BMI) 36.0-36.9, adult: Secondary | ICD-10-CM

## 2020-07-03 LAB — CBC WITH DIFFERENTIAL/PLATELET
Basophils Absolute: 0.1 10*3/uL (ref 0.0–0.1)
Basophils Relative: 0.9 % (ref 0.0–3.0)
Eosinophils Absolute: 0.2 10*3/uL (ref 0.0–0.7)
Eosinophils Relative: 2.1 % (ref 0.0–5.0)
HCT: 40.6 % (ref 36.0–46.0)
Hemoglobin: 13.5 g/dL (ref 12.0–15.0)
Lymphocytes Relative: 23.1 % (ref 12.0–46.0)
Lymphs Abs: 2 10*3/uL (ref 0.7–4.0)
MCHC: 33.4 g/dL (ref 30.0–36.0)
MCV: 98.9 fl (ref 78.0–100.0)
Monocytes Absolute: 0.7 10*3/uL (ref 0.1–1.0)
Monocytes Relative: 7.9 % (ref 3.0–12.0)
Neutro Abs: 5.8 10*3/uL (ref 1.4–7.7)
Neutrophils Relative %: 66 % (ref 43.0–77.0)
Platelets: 254 10*3/uL (ref 150.0–400.0)
RBC: 4.1 Mil/uL (ref 3.87–5.11)
RDW: 13 % (ref 11.5–15.5)
WBC: 8.8 10*3/uL (ref 4.0–10.5)

## 2020-07-03 LAB — LIPID PANEL
Cholesterol: 202 mg/dL — ABNORMAL HIGH (ref 0–200)
HDL: 59.3 mg/dL (ref >39.00)
LDL Cholesterol: 124 mg/dL — ABNORMAL HIGH (ref 0–99)
NonHDL: 143.16
Total CHOL/HDL Ratio: 3
Triglycerides: 95 mg/dL (ref 0.0–149.0)
VLDL: 19 mg/dL (ref 0.0–40.0)

## 2020-07-03 LAB — TSH: TSH: 1.95 u[IU]/mL (ref 0.35–4.50)

## 2020-07-03 LAB — FERRITIN: Ferritin: 234.7 ng/mL (ref 10.0–291.0)

## 2020-07-03 LAB — COMPREHENSIVE METABOLIC PANEL
ALT: 23 U/L (ref 0–35)
AST: 23 U/L (ref 0–37)
Albumin: 3.9 g/dL (ref 3.5–5.2)
Alkaline Phosphatase: 76 U/L (ref 39–117)
BUN: 12 mg/dL (ref 6–23)
CO2: 29 mEq/L (ref 19–32)
Calcium: 9 mg/dL (ref 8.4–10.5)
Chloride: 102 mEq/L (ref 96–112)
Creatinine, Ser: 0.85 mg/dL (ref 0.40–1.20)
GFR: 79.18 mL/min (ref >60.00)
Glucose, Bld: 92 mg/dL (ref 70–99)
Potassium: 3.6 mEq/L (ref 3.5–5.1)
Sodium: 137 mEq/L (ref 135–145)
Total Bilirubin: 0.8 mg/dL (ref 0.2–1.2)
Total Protein: 7.2 g/dL (ref 6.0–8.3)

## 2020-07-03 LAB — VITAMIN D 25 HYDROXY (VIT D DEFICIENCY, FRACTURES): VITD: 23.35 ng/mL — ABNORMAL LOW (ref 30.00–100.00)

## 2020-07-03 NOTE — Progress Notes (Signed)
Theresa Johnson is a 49 y.o. female with the following history as recorded in [employer]:  Patient Active Problem List   Diagnosis Date Noted   Perforated tympanic membrane, left 10/06/2018   Encounter for general adult medical examination with abnormal findings 07/13/2018   Chronic midline low back pain with left-sided sciatica 06/13/2018   Venous insufficiency of both lower extremities 06/13/2018   Vitamin D deficiency 03/13/2016   Abnormal Pap smear of cervix 12/25/2013   Cervical stenosis (uterine cervix) 12/25/2013   Endometrial polyp 12/25/2013   Fibroids 12/25/2013   Allergic rhinitis 06/20/2008   Breast nodule 06/20/2008   Family history of diabetes mellitus (DM) 06/20/2008   FH: colon cancer 06/20/2008   Knee pain 06/20/2008    Current Outpatient Medications  Medication Sig Dispense Refill   cetirizine (ZYRTEC) 10 MG chewable tablet Chew 10 mg by mouth daily.     Cholecalciferol 125 MCG (5000 UT) capsule Take by mouth.     Coenzyme Q10 200 MG capsule Take by mouth.     Glucosamine 500 MG CAPS Take by mouth.     Lactobacillus TABS Take by mouth.     magnesium gluconate (MAGONATE) 500 MG tablet Take by mouth.     Multiple Vitamins-Iron TABS Take by mouth.     Turmeric 500 MG TABS Take by mouth.     No current facility-administered medications for this visit.    Allergies: Patient has no known allergies.  No past medical history on file.  Past Surgical History:  Procedure Laterality Date   BREAST BIOPSY      Family History  Problem Relation Age of Onset   Depression Mother    Arthritis Mother    Hyperlipidemia Mother    Hypertension Mother     Social History   Tobacco Use   Smoking status: Never Smoker   Smokeless tobacco: Never Used  Substance Use Topics   Alcohol use: Not on file    Subjective:  Patient presents today as a new patient; wants to get a "baseline CPE" today; Is up to date on GYN- [facility]YN);   Colonoscopy done in September 2021- [facility] GI);   Would like to get flu and Shingrix today; Up to date on dental and vision exams;   LMP- irregular/ known fibroids  Review of Systems  Constitutional: Negative.   HENT: Negative.   Eyes: Negative.   Respiratory: Negative.   Cardiovascular: Negative.   Gastrointestinal: Negative.   Genitourinary: Negative.   Musculoskeletal: Negative.   Skin: Negative.   Neurological: Negative.   Endo/Heme/Allergies: Negative.   Psychiatric/Behavioral: Negative.      Objective:  Vitals:   07/03/20 0903  BP: 130/82  Pulse: 90  Temp: 98.4 F (36.9 C)  TempSrc: Oral  SpO2: 96%  Weight: 230 lb (104.3 kg)  Height: 5' 7"  (1.702 m)    General: Well developed, well nourished, in no acute distress  Skin : Warm and dry.  Head: Normocephalic and atraumatic  Eyes: Sclera and conjunctiva clear; pupils round and reactive to light; extraocular movements intact  Ears: External normal; canals clear; tympanic membranes normal  Oropharynx: Pink, supple. No suspicious lesions  Neck: Supple without thyromegaly, adenopathy  Lungs: Respirations unlabored; clear to auscultation bilaterally without wheeze, rales, rhonchi  CVS exam: normal rate and regular rhythm.  Abdomen: Soft; nontender; nondistended; normoactive bowel sounds; no masses or hepatosplenomegaly  Musculoskeletal: No deformities; no active joint inflammation  Extremities: No edema, cyanosis, clubbing  Vessels: Symmetric bilaterally  Neurologic: Alert and oriented; speech intact; face symmetrical; moves all extremities well; CNII-XII intact without focal deficit   Assessment:  1. PE (physical exam), annual   2. Need for shingles vaccine   3. Needs flu shot   4. Lipid screening   5. Anemia, unspecified type   6. Vitamin D deficiency   7. Class 2 obesity without serious comorbidity with body mass index (BMI) of 36.0 to 36.9 in adult, unspecified obesity type     Plan:  Age appropriate  preventive healthcare needs addressed; encouraged regular eye doctor and dental exams; encouraged regular exercise; will update labs as needed today; follow-up to be determined; Flu shot and Shingrix given; return in 2 month for #2 Shingrix;  Refer to healthy weight and wellness;   Return in about 2 months (around 09/02/2020) for Shingrix #2.  Orders Placed This Encounter  Procedures   Flu Vaccine QUAD 36+ mos IM   Varicella-zoster vaccine IM   CBC with Differential/Platelet    Standing Status:   Future    Number of Occurrences:   1    Standing Expiration Date:   07/03/2021   Comp Met (CMET)    Standing Status:   Future    Number of Occurrences:   1    Standing Expiration Date:   07/03/2021   Lipid panel    Standing Status:   Future    Number of Occurrences:   1    Standing Expiration Date:   07/03/2021   TSH    Standing Status:   Future    Number of Occurrences:   1    Standing Expiration Date:   07/03/2021   Ferritin    Standing Status:   Future    Number of Occurrences:   1    Standing Expiration Date:   07/03/2021   Vitamin D (25 hydroxy)    Standing Status:   Future    Number of Occurrences:   1    Standing Expiration Date:   07/03/2021   Amb Ref to Medical Weight Management    Referral Priority:   Routine    Referral Type:   Consultation    Referred to Provider:   Starlyn Skeans, MD    Number of Visits Requested:   1    Requested Prescriptions    No prescriptions requested or ordered in this encounter

## 2020-07-09 ENCOUNTER — Other Ambulatory Visit: Payer: Self-pay | Admitting: Family

## 2020-07-09 MED ORDER — VITAMIN D (ERGOCALCIFEROL) 1.25 MG (50000 UNIT) PO CAPS: 50000.0000 [IU] | ORAL_CAPSULE | ORAL | 0 refills | Status: AC

## 2020-07-11 ENCOUNTER — Encounter: Payer: Self-pay | Admitting: Family

## 2020-09-10 ENCOUNTER — Other Ambulatory Visit: Payer: Self-pay

## 2020-09-11 ENCOUNTER — Ambulatory Visit (INDEPENDENT_AMBULATORY_CARE_PROVIDER_SITE_OTHER): Payer: BC Managed Care – PPO

## 2020-09-11 DIAGNOSIS — Z23 Encounter for immunization: Secondary | ICD-10-CM | POA: Diagnosis not present

## 2020-09-14 ENCOUNTER — Encounter (INDEPENDENT_AMBULATORY_CARE_PROVIDER_SITE_OTHER): Payer: Self-pay

## 2020-10-01 ENCOUNTER — Other Ambulatory Visit: Payer: Self-pay | Admitting: Family

## 2020-10-03 ENCOUNTER — Ambulatory Visit (INDEPENDENT_AMBULATORY_CARE_PROVIDER_SITE_OTHER): Payer: BC Managed Care – PPO | Admitting: Family Medicine

## 2020-10-03 ENCOUNTER — Other Ambulatory Visit: Payer: Self-pay

## 2020-10-03 ENCOUNTER — Encounter (INDEPENDENT_AMBULATORY_CARE_PROVIDER_SITE_OTHER): Payer: Self-pay | Admitting: Family Medicine

## 2020-10-03 VITALS — BP 130/71 | HR 78 | Temp 98.4°F | Ht 66.0 in | Wt 227.0 lb

## 2020-10-03 DIAGNOSIS — Z1331 Encounter for screening for depression: Secondary | ICD-10-CM

## 2020-10-03 DIAGNOSIS — E559 Vitamin D deficiency, unspecified: Secondary | ICD-10-CM | POA: Diagnosis not present

## 2020-10-03 DIAGNOSIS — F39 Unspecified mood [affective] disorder: Secondary | ICD-10-CM | POA: Diagnosis not present

## 2020-10-03 DIAGNOSIS — Z9189 Other specified personal risk factors, not elsewhere classified: Secondary | ICD-10-CM | POA: Insufficient documentation

## 2020-10-03 DIAGNOSIS — R0602 Shortness of breath: Secondary | ICD-10-CM

## 2020-10-03 DIAGNOSIS — I872 Venous insufficiency (chronic) (peripheral): Secondary | ICD-10-CM

## 2020-10-03 DIAGNOSIS — R6889 Other general symptoms and signs: Secondary | ICD-10-CM | POA: Diagnosis not present

## 2020-10-03 DIAGNOSIS — F3289 Other specified depressive episodes: Secondary | ICD-10-CM

## 2020-10-03 DIAGNOSIS — Z0289 Encounter for other administrative examinations: Secondary | ICD-10-CM

## 2020-10-03 DIAGNOSIS — E739 Lactose intolerance, unspecified: Secondary | ICD-10-CM | POA: Insufficient documentation

## 2020-10-03 DIAGNOSIS — Z6836 Body mass index (BMI) 36.0-36.9, adult: Secondary | ICD-10-CM

## 2020-10-03 DIAGNOSIS — R5383 Other fatigue: Secondary | ICD-10-CM

## 2020-10-04 LAB — CBC WITH DIFFERENTIAL/PLATELET
Basophils Absolute: 0 10*3/uL (ref 0.0–0.2)
Basos: 0 %
EOS (ABSOLUTE): 0.2 10*3/uL (ref 0.0–0.4)
Eos: 3 %
Hematocrit: 41.8 % (ref 34.0–46.6)
Hemoglobin: 14 g/dL (ref 11.1–15.9)
Immature Grans (Abs): 0 10*3/uL (ref 0.0–0.1)
Immature Granulocytes: 0 %
Lymphocytes Absolute: 2.1 10*3/uL (ref 0.7–3.1)
Lymphs: 31 %
MCH: 33.3 pg — ABNORMAL HIGH (ref 26.6–33.0)
MCHC: 33.5 g/dL (ref 31.5–35.7)
MCV: 100 fL — ABNORMAL HIGH (ref 79–97)
Monocytes Absolute: 0.6 10*3/uL (ref 0.1–0.9)
Monocytes: 9 %
Neutrophils Absolute: 3.8 10*3/uL (ref 1.4–7.0)
Neutrophils: 57 %
Platelets: 158 10*3/uL (ref 150–450)
RBC: 4.2 x10E6/uL (ref 3.77–5.28)
RDW: 12.1 % (ref 11.7–15.4)
WBC: 6.7 10*3/uL (ref 3.4–10.8)

## 2020-10-04 LAB — COMPREHENSIVE METABOLIC PANEL
ALT: 27 IU/L (ref 0–32)
AST: 33 IU/L (ref 0–40)
Albumin/Globulin Ratio: 1.4 (ref 1.2–2.2)
Albumin: 4.3 g/dL (ref 3.8–4.9)
Alkaline Phosphatase: 99 IU/L (ref 44–121)
BUN/Creatinine Ratio: 11 (ref 9–23)
BUN: 9 mg/dL (ref 6–24)
Bilirubin Total: 0.6 mg/dL (ref 0.0–1.2)
CO2: 23 mmol/L (ref 20–29)
Calcium: 9.2 mg/dL (ref 8.7–10.2)
Chloride: 104 mmol/L (ref 96–106)
Creatinine, Ser: 0.83 mg/dL (ref 0.57–1.00)
GFR calc Af Amer: 94 mL/min/{1.73_m2} (ref >59)
GFR calc non Af Amer: 82 mL/min/{1.73_m2} (ref >59)
Globulin, Total: 3 g/dL (ref 1.5–4.5)
Glucose: 86 mg/dL (ref 65–99)
Potassium: 4.5 mmol/L (ref 3.5–5.2)
Sodium: 139 mmol/L (ref 134–144)
Total Protein: 7.3 g/dL (ref 6.0–8.5)

## 2020-10-04 LAB — TSH: TSH: 1.59 u[IU]/mL (ref 0.450–4.500)

## 2020-10-04 LAB — LIPID PANEL
Chol/HDL Ratio: 3.3 ratio (ref 0.0–4.4)
Cholesterol, Total: 241 mg/dL — ABNORMAL HIGH (ref 100–199)
HDL: 72 mg/dL (ref >39)
LDL Chol Calc (NIH): 154 mg/dL — ABNORMAL HIGH (ref 0–99)
Triglycerides: 85 mg/dL (ref 0–149)
VLDL Cholesterol Cal: 15 mg/dL (ref 5–40)

## 2020-10-04 LAB — T3: T3, Total: 103 ng/dL (ref 71–180)

## 2020-10-04 LAB — VITAMIN B12: Vitamin B-12: 433 pg/mL (ref 232–1245)

## 2020-10-04 LAB — FOLATE: Folate: 9 ng/mL (ref >3.0)

## 2020-10-04 LAB — VITAMIN D 25 HYDROXY (VIT D DEFICIENCY, FRACTURES): Vit D, 25-Hydroxy: 25.7 ng/mL — ABNORMAL LOW (ref 30.0–100.0)

## 2020-10-04 LAB — INSULIN, RANDOM: INSULIN: 5.5 u[IU]/mL (ref 2.6–24.9)

## 2020-10-04 LAB — HEMOGLOBIN A1C
Est. average glucose Bld gHb Est-mCnc: 108 mg/dL
Hgb A1c MFr Bld: 5.4 % (ref 4.8–5.6)

## 2020-10-04 LAB — T4, FREE: Free T4: 1.29 ng/dL (ref 0.82–1.77)

## 2020-10-07 NOTE — Progress Notes (Signed)
Dear Theresa Repress, FNP,   Thank you for referring Theresa Johnson to our clinic. The following note includes my evaluation and treatment recommendations.  Chief Complaint:   OBESITY Theresa Johnson (MR# [id number]) is a 50 y.o. female who presents for evaluation and treatment of obesity and related comorbidities. Current BMI is Body mass index is 36.64 kg/m. [name] has been struggling with her weight for many years and has been unsuccessful in either losing weight, maintaining weight loss, or reaching her healthy weight goal.  Theresa Johnson is currently in the action stage of change and ready to dedicate time achieving and maintaining a healthier weight. Theresa Johnson is interested in becoming our patient and working on intensive lifestyle modifications including (but not limited to) diet and exercise for weight loss.  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  She lives with her husband, Theresa Johnson, and her 65 year old mother, Theresa Johnson.  Her goal is 30 pound weight loss in 6 months or less.  Craves pasta.  Snacks on ice cream and chips.  [name]'s habits were reviewed today and are as follows: Her family eats meals together, she thinks her family will eat healthier with her, her desired weight loss is 35 pounds, she has been heavy most of her life, she started gaining weight at age 61, her heaviest weight ever was 243 pounds, she craves pasta, she snacks frequently in the evenings, she wakes up frequently in the middle of the night to eat, she skips lunch frequently, she is frequently drinking liquids with calories, she frequently makes poor food choices, she has problems with excessive hunger and she struggles with emotional eating.  Depression Screen Theresa Johnson's Food and Mood (modified PHQ-9) score was 14.  Depression screen [facility] 2/9 10/03/2020  Decreased Interest 2  Down, Depressed, Hopeless 2  PHQ - 2 Score 4  Altered sleeping 1  Tired, decreased energy 3  Change in  appetite 3  Feeling bad or failure about yourself  1  Trouble concentrating 2  Moving slowly or fidgety/restless 0  Suicidal thoughts 0  PHQ-9 Score 14  Difficult doing work/chores Not difficult at all   Assessment/Plan:   1. Other fatigue Nimrat admits to daytime somnolence and reports waking up still tired. Patent has a history of symptoms of daytime fatigue, morning fatigue, morning headache and snoring. [name] generally gets 5 or 6 hours of sleep per night, and states that she has poor quality sleep. Snoring is present. Apneic episodes are not present. Epworth Sleepiness Score is 9.  Quintella does feel that her weight is causing her energy to be lower than it should be. Fatigue may be related to obesity, depression or many other causes. Labs will be ordered, and in the meanwhile, Theresa Johnson will focus on self care including making healthy food choices, increasing physical activity and focusing on stress reduction.  EKG and labs today.  - EKG 12-Lead - Vitamin B12 - CBC with Differential/Platelet - Comprehensive metabolic panel - Folate - Hemoglobin A1c - Insulin, random - Lipid panel - T3 - T4, free - TSH - VITAMIN D 25 Hydroxy (Vit-D Deficiency, Fractures)  2. SOBOE (shortness of breath on exertion) Theresa Johnson notes increasing shortness of breath with exercising and seems to be worsening over time with weight gain. She notes getting out of breath sooner with activity than she used to. This has gotten worse recently. Theresa Johnson denies shortness of breath at rest or orthopnea.  Theresa Johnson does feel that she gets out of breath more  easily that she used to when she exercises. Theresa Johnson's shortness of breath appears to be obesity related and exercise induced. She has agreed to work on weight loss and gradually increase exercise to treat her exercise induced shortness of breath. Will continue to monitor closely.  IC and labs today.  - Vitamin B12 - CBC with Differential/Platelet - Comprehensive  metabolic panel - Folate - Hemoglobin A1c - Insulin, random - Lipid panel - T3 - T4, free - TSH - VITAMIN D 25 Hydroxy (Vit-D Deficiency, Fractures)  3. Venous insufficiency of both lower extremities Jeani has venous insufficiency of both lower extremities.  Plan:  Decrease dietary salt and increase activity.  Will check labs today, as per below.  - Comprehensive metabolic panel - Hemoglobin A1c - Insulin, random  4. Lactose intolerance in adult Theresa Johnson is lactose intolerant.  Plan:  Recommend lactose-free Greek yogurt and Fairlife skim milk, etc.  5. Vitamin D deficiency Not at goal. Current vitamin D is 23.35, tested on 07/03/2020. Optimal goal > 50 ng/dL.  She is taking OTC vitamin D 5,000 IU daily.   Plan: Continue current OTC vitamin D 5,000 IU daily.  Will check vitamin D level today, as per below.  - VITAMIN D 25 Hydroxy (Vit-D Deficiency, Fractures)  6. Other depression, with emotional eating With anxiety.  PHQ-9 is 14.  Medication: None.  She has never been on any medications for mood.  Some depressed feelings since she moved here from [place] recently.  Increased stress.  Denies SI.  Plan:  Behavior modification techniques were discussed today to help deal with emotional/non-hunger eating behaviors.  Check labs today.  - Vitamin B12 - Comprehensive metabolic panel - T3 - T4, free - TSH - VITAMIN D 25 Hydroxy (Vit-D Deficiency, Fractures)  7. At risk for impaired metabolic function Due to Theresa Johnson's current state of health and medical condition(s), she is at a significantly higher risk for impaired metabolic function.  This places the patient at a much greater risk to subsequently develop cardiopulmonary conditions that can negatively affect the patient's quality of life.  At least 10 minutes was spent on counseling Theresa Johnson about these concerns today, and I stressed the importance of reversing these risks factors.  The initial goal is to lose at least 5-10% of  starting weight to help reduce risk factors.  Counseling:  Intensive lifestyle modifications discussed with Theresa Johnson as the most appropriate first line treatment.  she will continue to work on diet, exercise, and weight loss efforts.  We will continue to reassess these conditions on a fairly regular basis in an attempt to decrease the patient's overall morbidity and mortality.  8. Class 2 severe obesity with serious comorbidity and body mass index (BMI) of 36.0 to 36.9 in adult, unspecified obesity type (HCC)  Kynlea is currently in the action stage of change and her goal is to continue with weight loss efforts. I recommend Sharilyn begin the structured treatment plan as follows:  She has agreed to the Category 3 Plan.  Exercise goals: As is.   Behavioral modification strategies: decreasing simple carbohydrates, no skipping meals, meal planning and cooking strategies and planning for success.  She was informed of the importance of frequent follow-up visits to maximize her success with intensive lifestyle modifications for her multiple health conditions. She was informed we would discuss her lab results at her next visit unless there is a critical issue that needs to be addressed sooner. Heavenly agreed to keep her next visit at the agreed upon time  to discuss these results.  Objective:   Blood pressure 130/71, pulse 78, temperature 98.4 F (36.9 C), height 5\' 6"  (1.676 m), weight 227 lb (103 kg), SpO2 96 %. Body mass index is 36.64 kg/m.  EKG: Normal sinus rhythm, rate 76 bpm.  Indirect Calorimeter completed today shows a VO2 of 287 and a REE of 2002.  Her calculated basal metabolic rate is 6283 thus her basal metabolic rate is better than expected.  General: Cooperative, alert, well developed, in no acute distress. HEENT: Conjunctivae and lids unremarkable. Cardiovascular: Regular rhythm.  Lungs: Normal work of breathing. Neurologic: No focal deficits.   Lab Results  Component Value  Date   CREATININE 0.83 10/03/2020   BUN 9 10/03/2020   NA 139 10/03/2020   K 4.5 10/03/2020   CL 104 10/03/2020   CO2 23 10/03/2020   Lab Results  Component Value Date   ALT 27 10/03/2020   AST 33 10/03/2020   ALKPHOS 99 10/03/2020   BILITOT 0.6 10/03/2020   Lab Results  Component Value Date   HGBA1C 5.4 10/03/2020   Lab Results  Component Value Date   INSULIN 5.5 10/03/2020   Lab Results  Component Value Date   TSH 1.590 10/03/2020   Lab Results  Component Value Date   CHOL 241 (H) 10/03/2020   HDL 72 10/03/2020   LDLCALC 154 (H) 10/03/2020   TRIG 85 10/03/2020   CHOLHDL 3.3 10/03/2020   Lab Results  Component Value Date   WBC 6.7 10/03/2020   HGB 14.0 10/03/2020   HCT 41.8 10/03/2020   MCV 100 (H) 10/03/2020   PLT 158 10/03/2020   Lab Results  Component Value Date   FERRITIN 234.7 07/03/2020   Attestation Statements:   This is the patient's first visit at [facility]. The patient's NEW PATIENT PACKET was reviewed at length. Included in the packet: current and past health history, medications, allergies, ROS, gynecologic history (women only), surgical history, family history, social history, weight history, weight loss surgery history (for those that have had weight loss surgery), nutritional evaluation, mood and food questionnaire, PHQ9, Epworth questionnaire, sleep habits questionnaire, patient life and health improvement goals questionnaire. These will all be scanned into the patient's chart under media.   During the visit, I independently reviewed the patient's EKG, bioimpedance scale results, and indirect calorimeter results. I used this information to tailor a meal plan for the patient that will help her to lose weight and will improve her obesity-related conditions going forward. I performed a medically necessary appropriate examination and/or evaluation. I discussed the assessment and treatment plan with the patient. The patient was provided  an opportunity to ask questions and all were answered. The patient agreed with the plan and demonstrated an understanding of the instructions. Labs were ordered at this visit and will be reviewed at the next visit unless more critical results need to be addressed immediately. Clinical information was updated and documented in the EMR.   I, Water quality scientist, CMA, am acting as Location manager for [employer], DO.  I have reviewed the above documentation for accuracy and completeness, and I agree with the above. Marjory Sneddon, D.O.  The [place] was signed into law in 2016 which includes the topic of electronic health records.  This provides immediate access to information in MyChart.  This includes consultation notes, operative notes, office notes, lab results and pathology reports.  If you have any questions about what you read please let us  know at your next visit so we can discuss your concerns and take corrective action if need be.  We are right here with you.

## 2020-10-17 ENCOUNTER — Encounter (INDEPENDENT_AMBULATORY_CARE_PROVIDER_SITE_OTHER): Payer: Self-pay | Admitting: Family Medicine

## 2020-10-17 ENCOUNTER — Other Ambulatory Visit: Payer: Self-pay

## 2020-10-17 ENCOUNTER — Ambulatory Visit (INDEPENDENT_AMBULATORY_CARE_PROVIDER_SITE_OTHER): Payer: BC Managed Care – PPO | Admitting: Family Medicine

## 2020-10-17 VITALS — BP 123/76 | HR 76 | Temp 98.7°F | Ht 66.0 in | Wt 221.0 lb

## 2020-10-17 DIAGNOSIS — E88819 Insulin resistance, unspecified: Secondary | ICD-10-CM | POA: Insufficient documentation

## 2020-10-17 DIAGNOSIS — E7800 Pure hypercholesterolemia, unspecified: Secondary | ICD-10-CM

## 2020-10-17 DIAGNOSIS — E8881 Metabolic syndrome: Secondary | ICD-10-CM | POA: Diagnosis not present

## 2020-10-17 DIAGNOSIS — E559 Vitamin D deficiency, unspecified: Secondary | ICD-10-CM | POA: Diagnosis not present

## 2020-10-17 DIAGNOSIS — E7849 Other hyperlipidemia: Secondary | ICD-10-CM

## 2020-10-17 MED ORDER — VITAMIN D (ERGOCALCIFEROL) 1.25 MG (50000 UNIT) PO CAPS: 50000.0000 [IU] | ORAL_CAPSULE | ORAL | 0 refills | Status: DC

## 2020-10-17 NOTE — Patient Instructions (Signed)
The 10-year ASCVD risk score Mikey Bussing DC Brooke Bonito., et al., 2013) is: 1.5%   Values used to calculate the score:     Age: 50 years     Sex: Female     Is Non-Hispanic African American: Yes     Diabetic: No     Tobacco smoker: No     Systolic Blood Pressure: 196 mmHg     Is BP treated: No     HDL Cholesterol: 72 mg/dL     Total Cholesterol: 241 mg/dL

## 2020-10-21 DIAGNOSIS — E7800 Pure hypercholesterolemia, unspecified: Secondary | ICD-10-CM | POA: Insufficient documentation

## 2020-10-28 NOTE — Progress Notes (Signed)
Chief Complaint:   OBESITY Theresa Johnson is here to discuss her progress with her obesity treatment plan along with follow-up of her obesity related diagnoses.   Today's visit was #: 2 Starting weight: 227 lbs Starting date: 10/03/2020 Today's weight: 221 lbs Today's date: 10/17/2020 Total lbs lost to date: 6 lbs Body mass index is 35.67 kg/m.  Total weight loss percentage to date: -2.64%  Interim History:  Theresa Johnson is here today for her first follow-up office visit since starting the program with [place].  We will be reviewing her NEW Meal Plan and will be discussing all recent labs done here and/or done at outside facilities.  Extended time was spent counseling Theresa Johnson on all new disease processes that were discovered or that are worsening.    She is following the meal plan with only minor concerns/ questions today.  Patient's meal and food recall appears to be accurate and consistent with what is on the plan when she is following it.  When on plan, her hunger and cravings are well controlled.    Current Meal Plan: the Category 3 Plan for 100% of the time.  Current Exercise Plan: None.  Assessment/Plan:   1. Insulin resistance Improving, but not optimized. Goal is HgbA1c < 5.7, fasting insulin closer to 5.  Medication: None.    Plan:  She will continue to focus on protein-rich, low simple carbohydrate foods. We reviewed the importance of hydration, regular exercise for stress reduction, and restorative sleep.  Handouts given.  Lab Results  Component Value Date   HGBA1C 5.4 10/03/2020   Lab Results  Component Value Date   INSULIN 5.5 10/03/2020   2. Other hyperlipidemia Course: Improving, but not optimized. Lipid-lowering medications: None.   Plan: Dietary changes: Increase soluble fiber, decrease simple carbohydrates, decrease saturated fat. Exercise changes: Moderate to vigorous-intensity aerobic activity 150 minutes per week or as tolerated. We will  continue to monitor along with PCP/specialists as it pertains to her weight loss journey.  Lab Results  Component Value Date   CHOL 241 (H) 10/03/2020   HDL 72 10/03/2020   LDLCALC 154 (H) 10/03/2020   TRIG 85 10/03/2020   CHOLHDL 3.3 10/03/2020   Lab Results  Component Value Date   ALT 27 10/03/2020   AST 33 10/03/2020   ALKPHOS 99 10/03/2020   BILITOT 0.6 10/03/2020   The 10-year ASCVD risk score Mikey Bussing DC Jr., et al., 2013) is: 1.5%   Values used to calculate the score:     Age: 50 years     Sex: Female     Is Non-Hispanic African American: Yes     Diabetic: No     Tobacco smoker: No     Systolic Blood Pressure: 631 mmHg     Is BP treated: No     HDL Cholesterol: 72 mg/dL     Total Cholesterol: 241 mg/dL  3. Vitamin D deficiency Not at goal. Current vitamin D is 25.7, tested on 10/03/2020. Optimal goal > 50 ng/dL.   Plan: Start to take prescription Vitamin D @50 ,000 IU every week as prescribed.  Follow-up for routine testing of Vitamin D, at least 2-3 times per year to avoid over-replacement.  - Start Vitamin D, Ergocalciferol, (DRISDOL) 1.25 MG (50000 UNIT) CAPS capsule; Take 1 capsule (50,000 Units total) by mouth every 7 (seven) days.  Dispense: 4 capsule; Refill: 0  4. Class 2 severe obesity with serious comorbidity and body mass index (BMI) of 35.0 to 35.9  in adult, unspecified obesity type ([place])  Course: Theresa Johnson is currently in the action stage of change. As such, her goal is to continue with weight loss efforts.   Nutrition goals: She has agreed to the Category 3 Plan.   Exercise goals: As is.  Behavioral modification strategies: increasing lean protein intake, increasing water intake, meal planning and cooking strategies, emotional eating strategies, avoiding temptations and planning for success.  Theresa Johnson has agreed to follow-up with our clinic in 2 weeks. She was informed of the importance of frequent follow-up visits to maximize her success with intensive  lifestyle modifications for her multiple health conditions.   Objective:   Blood pressure 123/76, pulse 76, temperature 98.7 F (37.1 C), height 5\' 6"  (1.676 m), weight 221 lb (100.2 kg), SpO2 97 %. Body mass index is 35.67 kg/m.  General: Cooperative, alert, well developed, in no acute distress. HEENT: Conjunctivae and lids unremarkable. Cardiovascular: Regular rhythm.  Lungs: Normal work of breathing. Neurologic: No focal deficits.   Lab Results  Component Value Date   CREATININE 0.83 10/03/2020   BUN 9 10/03/2020   NA 139 10/03/2020   K 4.5 10/03/2020   CL 104 10/03/2020   CO2 23 10/03/2020   Lab Results  Component Value Date   ALT 27 10/03/2020   AST 33 10/03/2020   ALKPHOS 99 10/03/2020   BILITOT 0.6 10/03/2020   Lab Results  Component Value Date   HGBA1C 5.4 10/03/2020   Lab Results  Component Value Date   INSULIN 5.5 10/03/2020   Lab Results  Component Value Date   TSH 1.590 10/03/2020   Lab Results  Component Value Date   CHOL 241 (H) 10/03/2020   HDL 72 10/03/2020   LDLCALC 154 (H) 10/03/2020   TRIG 85 10/03/2020   CHOLHDL 3.3 10/03/2020   Lab Results  Component Value Date   WBC 6.7 10/03/2020   HGB 14.0 10/03/2020   HCT 41.8 10/03/2020   MCV 100 (H) 10/03/2020   PLT 158 10/03/2020   Lab Results  Component Value Date   FERRITIN 234.7 07/03/2020   Attestation Statements:   Reviewed by clinician on day of visit: allergies, medications, problem list, medical history, surgical history, family history, social history, and previous encounter notes.  Time spent on visit including pre-visit chart review and post-visit care and charting was >40 minutes.   I, Water quality scientist, CMA, am acting as Location manager for [employer], DO.  I have reviewed the above documentation for accuracy and completeness, and I agree with the above. Marjory Sneddon, D.O.  The [place] was signed into law in 2016 which includes the topic of  electronic health records.  This provides immediate access to information in MyChart.  This includes consultation notes, operative notes, office notes, lab results and pathology reports.  If you have any questions about what you read please let us know at your next visit so we can discuss your concerns and take corrective action if need be.  We are right here with you.

## 2020-10-31 ENCOUNTER — Ambulatory Visit (INDEPENDENT_AMBULATORY_CARE_PROVIDER_SITE_OTHER): Payer: BC Managed Care – PPO | Admitting: Family Medicine

## 2020-10-31 ENCOUNTER — Other Ambulatory Visit: Payer: Self-pay

## 2020-10-31 ENCOUNTER — Encounter (INDEPENDENT_AMBULATORY_CARE_PROVIDER_SITE_OTHER): Payer: Self-pay | Admitting: Family Medicine

## 2020-10-31 ENCOUNTER — Other Ambulatory Visit: Payer: Self-pay | Admitting: Family

## 2020-10-31 VITALS — BP 108/83 | HR 82 | Temp 98.7°F | Ht 66.0 in | Wt 215.0 lb

## 2020-10-31 DIAGNOSIS — Z6836 Body mass index (BMI) 36.0-36.9, adult: Secondary | ICD-10-CM

## 2020-10-31 DIAGNOSIS — E559 Vitamin D deficiency, unspecified: Secondary | ICD-10-CM

## 2020-10-31 DIAGNOSIS — Z9189 Other specified personal risk factors, not elsewhere classified: Secondary | ICD-10-CM | POA: Insufficient documentation

## 2020-10-31 MED ORDER — VITAMIN D (ERGOCALCIFEROL) 1.25 MG (50000 UNIT) PO CAPS: 50000.0000 [IU] | ORAL_CAPSULE | ORAL | 0 refills | Status: DC

## 2020-11-01 NOTE — Telephone Encounter (Signed)
Pt is requesting med refill for Vit D. It looks like she was just seen yesterday in your office for this concern.   Thanks

## 2020-11-11 NOTE — Progress Notes (Signed)
Chief Complaint:   OBESITY Theresa Johnson is here to discuss her progress with her obesity treatment plan along with follow-up of her obesity related diagnoses.   Today's visit was #: 3 Starting weight: 227 lbs Starting date: 10/03/2020 Today's weight: 215 lbs Today's date: 10/03/2020 Total lbs lost to date: 12 lbs Body mass index is 34.7 kg/m.  Total weight loss percentage to date: -5.29%  Interim History:  Adrena says that over the last 2 weeks she started pilates.  She had a birtday and ate off plan all day.  Denies hunger or cravings.  Difficult to get all food in in a day.  She says she is not that hungry.  Current Meal Plan: the Category 3 Plan for 90% of the time.  Current Exercise Plan: Pilates for 50 minutes 4 times per week.  Assessment/Plan:   No orders of the defined types were placed in this encounter.   Medications Discontinued During This Encounter  Medication Reason  . Vitamin D, Ergocalciferol, (DRISDOL) 1.25 MG (50000 UNIT) CAPS capsule Reorder     Meds ordered this encounter  Medications  . DISCONTD: Vitamin D, Ergocalciferol, (DRISDOL) 1.25 MG (50000 UNIT) CAPS capsule    Sig: Take 1 capsule (50,000 Units total) by mouth every 7 (seven) days.    Dispense:  4 capsule    Refill:  0     1. Vitamin D deficiency Not at goal. Current vitamin D is 25.7, tested on 10/03/2020. Optimal goal > 50 ng/dL.  She is taking vitamin D 50,000 IU weekly.  Plan: Continue to take prescription Vitamin D @50 ,000 IU every week as prescribed.  Follow-up for routine testing of Vitamin D, at least 2-3 times per year to avoid over-replacement.  - Refill Vitamin D, Ergocalciferol, (DRISDOL) 1.25 MG (50000 UNIT) CAPS capsule; Take 1 capsule (50,000 Units total) by mouth every 7 (seven) days.  Dispense: 4 capsule; Refill: 0  2. At risk for deficient intake of food Tysheena was given extensive education and counseling today of more than 9 minutes on risks associated with deficient food  intake.  Counseled her on the importance of following our prescribed meal plan and eating adequate amounts of protein.  Discussed with Curly Rim White-Haith that inadequate food intake over longer periods of time can slow their metabolism down significantly.   3. Obesity, current BMI 34.7  Course: Nur is currently in the action stage of change. As such, her goal is to continue with weight loss efforts.   Nutrition goals: She has agreed to the Category 3 Plan.   Exercise goals: As is.  Behavioral modification strategies: increasing lean protein intake, decreasing simple carbohydrates and increasing water intake.  Nechelle has agreed to follow-up with our clinic in 2 weeks. She was informed of the importance of frequent follow-up visits to maximize her success with intensive lifestyle modifications for her multiple health conditions.   Objective:   Blood pressure 108/83, pulse 82, temperature 98.7 F (37.1 C), height 5\' 6"  (1.676 m), weight 215 lb (97.5 kg), SpO2 96 %. Body mass index is 34.7 kg/m.  General: Cooperative, alert, well developed, in no acute distress. HEENT: Conjunctivae and lids unremarkable. Cardiovascular: Regular rhythm.  Lungs: Normal work of breathing. Neurologic: No focal deficits.   Lab Results  Component Value Date   CREATININE 0.83 10/03/2020   BUN 9 10/03/2020   NA 139 10/03/2020   K 4.5 10/03/2020   CL 104 10/03/2020   CO2 23 10/03/2020   Lab Results  Component Value Date   ALT 27 10/03/2020   AST 33 10/03/2020   ALKPHOS 99 10/03/2020   BILITOT 0.6 10/03/2020   Lab Results  Component Value Date   HGBA1C 5.4 10/03/2020   Lab Results  Component Value Date   INSULIN 5.5 10/03/2020   Lab Results  Component Value Date   TSH 1.590 10/03/2020   Lab Results  Component Value Date   CHOL 241 (H) 10/03/2020   HDL 72 10/03/2020   LDLCALC 154 (H) 10/03/2020   TRIG 85 10/03/2020   CHOLHDL 3.3 10/03/2020   Lab Results  Component Value Date    WBC 6.7 10/03/2020   HGB 14.0 10/03/2020   HCT 41.8 10/03/2020   MCV 100 (H) 10/03/2020   PLT 158 10/03/2020   Lab Results  Component Value Date   FERRITIN 234.7 07/03/2020   Attestation Statements:   Reviewed by clinician on day of visit: allergies, medications, problem list, medical history, surgical history, family history, social history, and previous encounter notes.  I, Water quality scientist, CMA, am acting as Location manager for [employer], DO.  I have reviewed the above documentation for accuracy and completeness, and I agree with the above. Marjory Sneddon, D.O.  The [place] was signed into law in 2016 which includes the topic of electronic health records.  This provides immediate access to information in MyChart.  This includes consultation notes, operative notes, office notes, lab results and pathology reports.  If you have any questions about what you read please let us know at your next visit so we can discuss your concerns and take corrective action if need be.  We are right here with you.

## 2020-11-14 ENCOUNTER — Other Ambulatory Visit: Payer: Self-pay

## 2020-11-14 ENCOUNTER — Encounter (INDEPENDENT_AMBULATORY_CARE_PROVIDER_SITE_OTHER): Payer: Self-pay | Admitting: Family Medicine

## 2020-11-14 ENCOUNTER — Ambulatory Visit (INDEPENDENT_AMBULATORY_CARE_PROVIDER_SITE_OTHER): Payer: BC Managed Care – PPO | Admitting: Family Medicine

## 2020-11-14 VITALS — BP 104/64 | HR 83 | Temp 98.5°F | Ht 66.0 in | Wt 212.0 lb

## 2020-11-14 DIAGNOSIS — E559 Vitamin D deficiency, unspecified: Secondary | ICD-10-CM | POA: Diagnosis not present

## 2020-11-14 DIAGNOSIS — Z9189 Other specified personal risk factors, not elsewhere classified: Secondary | ICD-10-CM

## 2020-11-14 DIAGNOSIS — Z6836 Body mass index (BMI) 36.0-36.9, adult: Secondary | ICD-10-CM

## 2020-11-14 MED ORDER — VITAMIN D (ERGOCALCIFEROL) 1.25 MG (50000 UNIT) PO CAPS: 50000.0000 [IU] | ORAL_CAPSULE | ORAL | 0 refills | Status: DC

## 2020-11-19 NOTE — Progress Notes (Signed)
Chief Complaint:   OBESITY Theresa Johnson is here to discuss her progress with her obesity treatment plan along with follow-up of her obesity related diagnoses.   Today's visit was #: 4 Starting weight: 227 lbs Starting date: 10/03/2020 Today's weight: 212 lbs Today's date: 11/14/2020 Total lbs lost to date: 15 lbs Body mass index is 34.22 kg/m.  Total weight loss percentage to date: -6.61%  Interim History:  She lost 3 pounds.  Had [employer] and was eating out all week.  Made Theresa Johnson/PC.  Plan:  No weighing at home and get back to exercising.  She is going to the beach with her girlfriends and we discussed strategies for success.  Current Meal Plan: the Category 3 Plan for 75% of the time.  Current Exercise Plan: Pilates, Landscape architect for 50 minutes 4 times per week.  Assessment/Plan:   1. Vitamin D deficiency Not at goal. Current vitamin D is 25.7, tested on 10/03/2020. Optimal goal > 50 ng/dL.  She is taking vitamin D 50,000 IU weekly.   Plan: Continue to take prescription Vitamin D @50 ,000 IU every week as prescribed.  Follow-up for routine testing of Vitamin D, at least 2-3 times per year to avoid over-replacement.  - Refill Vitamin D, Ergocalciferol, (DRISDOL) 1.25 MG (50000 UNIT) CAPS capsule; Take 1 capsule (50,000 Units total) by mouth every 7 (seven) days.  Dispense: 4 capsule; Refill: 0  2. At risk for impaired metabolic function Due to Theresa Johnson's current state of health and medical condition(s), she is at a significantly higher risk for impaired metabolic function.   At least 9 minutes was spent on counseling Theresa Johnson about these concerns today.  This places the patient at a much greater risk to subsequently develop cardio-pulmonary conditions that can negatively affect the patient's quality of life.  I stressed the importance of reversing these risks factors.  The initial goal is to lose at least 5-10% of starting weight to help reduce risk factors.  Counseling:   Intensive lifestyle modifications discussed with Theresa Johnson as the most appropriate first line treatment.  she will continue to work on diet, exercise, and weight loss efforts.  We will continue to reassess these conditions on a fairly regular basis in an attempt to decrease the patient's overall morbidity and mortality.  3. Obesity with current BMI of 34.2  Course: Theresa Johnson is currently in the action stage of change. As such, her goal is to continue with weight loss efforts.   Nutrition goals: She has agreed to the Category 3 Plan.   Exercise goals: As is.  Behavioral modification strategies: meal planning and cooking strategies, travel eating strategies and planning for success.  Theresa Johnson has agreed to follow-up with our clinic in 2-3 weeks with Theresa Potash, PA-C, and then in 2-3 weeks with me. She was informed of the importance of frequent follow-up visits to maximize her success with intensive lifestyle modifications for her multiple health conditions.   Objective:   Blood pressure 104/64, pulse 83, temperature 98.5 F (36.9 C), height 5\' 6"  (1.676 m), weight 212 lb (96.2 kg), SpO2 97 %. Body mass index is 34.22 kg/m.  General: Cooperative, alert, well developed, in no acute distress. HEENT: Conjunctivae and lids unremarkable. Cardiovascular: Regular rhythm.  Lungs: Normal work of breathing. Neurologic: No focal deficits.   Lab Results  Component Value Date   CREATININE 0.83 10/03/2020   BUN 9 10/03/2020   NA 139 10/03/2020   K 4.5 10/03/2020   CL 104 10/03/2020   CO2  23 10/03/2020   Lab Results  Component Value Date   ALT 27 10/03/2020   AST 33 10/03/2020   ALKPHOS 99 10/03/2020   BILITOT 0.6 10/03/2020   Lab Results  Component Value Date   HGBA1C 5.4 10/03/2020   Lab Results  Component Value Date   INSULIN 5.5 10/03/2020   Lab Results  Component Value Date   TSH 1.590 10/03/2020   Lab Results  Component Value Date   CHOL 241 (H) 10/03/2020   HDL 72  10/03/2020   LDLCALC 154 (H) 10/03/2020   TRIG 85 10/03/2020   CHOLHDL 3.3 10/03/2020   Lab Results  Component Value Date   WBC 6.7 10/03/2020   HGB 14.0 10/03/2020   HCT 41.8 10/03/2020   MCV 100 (H) 10/03/2020   PLT 158 10/03/2020   Lab Results  Component Value Date   FERRITIN 234.7 07/03/2020   Attestation Statements:   Reviewed by clinician on day of visit: allergies, medications, problem list, medical history, surgical history, family history, social history, and previous encounter notes.  I, Water quality scientist, CMA, am acting as Location manager for [employer], DO.  I have reviewed the above documentation for accuracy and completeness, and I agree with the above. Marjory Sneddon, D.O.  The [place] was signed into law in 2016 which includes the topic of electronic health records.  This provides immediate access to information in MyChart.  This includes consultation notes, operative notes, office notes, lab results and pathology reports.  If you have any questions about what you read please let us know at your next visit so we can discuss your concerns and take corrective action if need be.  We are right here with you.   Medications Discontinued During This Encounter  Medication Reason  . Vitamin D, Ergocalciferol, (DRISDOL) 1.25 MG (50000 UNIT) CAPS capsule Reorder     Meds ordered this encounter  Medications  . Vitamin D, Ergocalciferol, (DRISDOL) 1.25 MG (50000 UNIT) CAPS capsule    Sig: Take 1 capsule (50,000 Units total) by mouth every 7 (seven) days.    Dispense:  4 capsule    Refill:  0

## 2020-11-29 ENCOUNTER — Other Ambulatory Visit (INDEPENDENT_AMBULATORY_CARE_PROVIDER_SITE_OTHER): Payer: Self-pay | Admitting: Family Medicine

## 2020-11-29 DIAGNOSIS — E559 Vitamin D deficiency, unspecified: Secondary | ICD-10-CM

## 2020-12-02 NOTE — Telephone Encounter (Signed)
Dr.Opalski ?

## 2020-12-05 ENCOUNTER — Ambulatory Visit (INDEPENDENT_AMBULATORY_CARE_PROVIDER_SITE_OTHER): Payer: BC Managed Care – PPO | Admitting: Physician Assistant

## 2020-12-05 ENCOUNTER — Other Ambulatory Visit: Payer: Self-pay

## 2020-12-05 ENCOUNTER — Encounter (INDEPENDENT_AMBULATORY_CARE_PROVIDER_SITE_OTHER): Payer: Self-pay | Admitting: Physician Assistant

## 2020-12-05 VITALS — BP 127/63 | HR 80 | Temp 98.4°F | Wt 208.0 lb

## 2020-12-05 DIAGNOSIS — Z9189 Other specified personal risk factors, not elsewhere classified: Secondary | ICD-10-CM

## 2020-12-05 DIAGNOSIS — Z6835 Body mass index (BMI) 35.0-35.9, adult: Secondary | ICD-10-CM

## 2020-12-05 DIAGNOSIS — E559 Vitamin D deficiency, unspecified: Secondary | ICD-10-CM

## 2020-12-05 MED ORDER — VITAMIN D (ERGOCALCIFEROL) 1.25 MG (50000 UNIT) PO CAPS: 50000.0000 [IU] | ORAL_CAPSULE | ORAL | 0 refills | Status: DC

## 2020-12-10 NOTE — Progress Notes (Signed)
Chief Complaint:   OBESITY Theresa Johnson is here to discuss her progress with her obesity treatment plan along with follow-up of her obesity related diagnoses. Theresa Johnson is on the Category 3 Plan and states she is following her eating plan approximately 75-80% of the time. Theresa Johnson states she is doing pilates for 50 minutes 5 times per week.  Today's visit was #: 5 Starting weight: 227 lbs Starting date: 10/03/2020 Today's weight: 208 lbs Today's date: 12/05/2020 Total lbs lost to date: 19 Total lbs lost since last in-office visit: 4  Interim History: Theresa Johnson did very well with weight loss. She remained on the plan even during her recent girl's trip. She wants to strategize for her upcoming vacations.   Subjective:   1. Vitamin D deficiency Theresa Johnson is on Vit D, and she is tolerating it well.  2. At risk for osteoporosis Theresa Johnson is at higher risk of osteopenia and osteoporosis due to Vitamin D deficiency.   Assessment/Plan:   1. Vitamin D deficiency Low Vitamin D level contributes to fatigue and are associated with obesity, breast, and colon cancer. We will refill prescription Vitamin D for 1 month. Theresa Johnson will follow-up for routine testing of Vitamin D, at least 2-3 times per year to avoid over-replacement.  - Vitamin D, Ergocalciferol, (DRISDOL) 1.25 MG (50000 UNIT) CAPS capsule; Take 1 capsule (50,000 Units total) by mouth every 7 (seven) days.  Dispense: 4 capsule; Refill: 0  2. At risk for osteoporosis Theresa Johnson was given approximately 15 minutes of osteoporosis prevention counseling today. Theresa Johnson is at risk for osteopenia and osteoporosis due to her Vitamin D deficiency. She was encouraged to take her Vitamin D and follow her higher calcium diet and increase strengthening exercise to help strengthen her bones and decrease her risk of osteopenia and osteoporosis.  Repetitive spaced learning was employed today to elicit superior memory formation and behavioral change.  3. Class 2  severe obesity with serious comorbidity and body mass index (BMI) of 35.0 to 35.9 in adult, unspecified obesity type (HCC) Theresa Johnson is currently in the action stage of change. As such, her goal is to continue with weight loss efforts. She has agreed to the Category 3 Plan.   Exercise goals: As is.  Behavioral modification strategies: meal planning and cooking strategies, travel eating strategies and planning for success.  Theresa Johnson has agreed to follow-up with our clinic in 3 weeks. She was informed of the importance of frequent follow-up visits to maximize her success with intensive lifestyle modifications for her multiple health conditions.   Objective:   Blood pressure 127/63, pulse 80, temperature 98.4 F (36.9 C), weight 208 lb (94.3 kg), SpO2 98 %. Body mass index is 33.57 kg/m.  General: Cooperative, alert, well developed, in no acute distress. HEENT: Conjunctivae and lids unremarkable. Cardiovascular: Regular rhythm.  Lungs: Normal work of breathing. Neurologic: No focal deficits.   Lab Results  Component Value Date   CREATININE 0.83 10/03/2020   BUN 9 10/03/2020   NA 139 10/03/2020   K 4.5 10/03/2020   CL 104 10/03/2020   CO2 23 10/03/2020   Lab Results  Component Value Date   ALT 27 10/03/2020   AST 33 10/03/2020   ALKPHOS 99 10/03/2020   BILITOT 0.6 10/03/2020   Lab Results  Component Value Date   HGBA1C 5.4 10/03/2020   Lab Results  Component Value Date   INSULIN 5.5 10/03/2020   Lab Results  Component Value Date   TSH 1.590 10/03/2020   Lab Results  Component Value Date   CHOL 241 (H) 10/03/2020   HDL 72 10/03/2020   LDLCALC 154 (H) 10/03/2020   TRIG 85 10/03/2020   CHOLHDL 3.3 10/03/2020   Lab Results  Component Value Date   WBC 6.7 10/03/2020   HGB 14.0 10/03/2020   HCT 41.8 10/03/2020   MCV 100 (H) 10/03/2020   PLT 158 10/03/2020   Lab Results  Component Value Date   FERRITIN 234.7 07/03/2020   Attestation Statements:   Reviewed by  clinician on day of visit: allergies, medications, problem list, medical history, surgical history, family history, social history, and previous encounter notes.   [name], am acting as transcriptionist for [employer], PA-C.  I have reviewed the above documentation for accuracy and completeness, and I agree with the above. Abby Potash, PA-C

## 2020-12-18 ENCOUNTER — Ambulatory Visit (INDEPENDENT_AMBULATORY_CARE_PROVIDER_SITE_OTHER): Payer: BC Managed Care – PPO | Admitting: Family Medicine

## 2020-12-18 ENCOUNTER — Encounter (INDEPENDENT_AMBULATORY_CARE_PROVIDER_SITE_OTHER): Payer: Self-pay | Admitting: Family Medicine

## 2020-12-18 ENCOUNTER — Other Ambulatory Visit: Payer: Self-pay

## 2020-12-18 VITALS — BP 112/71 | HR 70 | Temp 98.4°F | Ht 66.0 in | Wt 203.0 lb

## 2020-12-18 DIAGNOSIS — E8881 Metabolic syndrome: Secondary | ICD-10-CM

## 2020-12-18 DIAGNOSIS — E7849 Other hyperlipidemia: Secondary | ICD-10-CM | POA: Diagnosis not present

## 2020-12-26 ENCOUNTER — Ambulatory Visit: Payer: BC Managed Care – PPO | Attending: Internal Medicine

## 2020-12-26 ENCOUNTER — Other Ambulatory Visit: Payer: Self-pay

## 2020-12-26 ENCOUNTER — Other Ambulatory Visit (HOSPITAL_BASED_OUTPATIENT_CLINIC_OR_DEPARTMENT_OTHER): Payer: Self-pay

## 2020-12-26 DIAGNOSIS — Z23 Encounter for immunization: Secondary | ICD-10-CM

## 2020-12-26 MED ORDER — PFIZER-BIONT COVID-19 VAC-TRIS 30 MCG/0.3ML IM SUSP
INTRAMUSCULAR | 0 refills | Status: DC
  Filled 2020-12-26: qty 0.3, 1d supply, fill #0

## 2020-12-26 NOTE — Progress Notes (Signed)
Chief Complaint:   OBESITY Theresa Johnson is here to discuss her progress with her obesity treatment plan along with follow-up of her obesity related diagnoses.   Today's visit was #: 6 Starting weight: 227 lbs Starting date: 10/03/2020 Today's weight: 203 lbs Today's date: 12/18/2020 Weight change since last visit: -5 lbs Total lbs lost to date: 24 lbs Body mass index is 32.77 kg/m.  Total weight loss percentage to date: -10.57%  Interim History:  Theresa Johnson went to [place] for 4-5 days.  She stayed on plan.  Working on the amount of water she drinks per day.  No issues with plan.  Current Meal Plan: the Category 3 Plan with breakfast and lunch options for 100% of the time.  Current Exercise Plan: Pilates for 50 minutes 4 times per week.  Assessment/Plan:   1. Insulin resistance At goal. Goal is HgbA1c < 5.7, fasting insulin closer to 5.  Medication: None.    Plan:  Consider medication in the future as needed to aid with weight loss goals.  She will continue to focus on protein-rich, low simple carbohydrate foods. We reviewed the importance of hydration, regular exercise for stress reduction, and restorative sleep.   Lab Results  Component Value Date   HGBA1C 5.4 10/03/2020   Lab Results  Component Value Date   INSULIN 5.5 10/03/2020    2. Other hyperlipidemia Course: Not at goal. Lipid-lowering medications: None.   Plan:  She will need repeat labs next office visit or following due to losing 10.6% of her original weight and almost at 3-4 months recheck mark.  Continue prudent nutritional plan and weight loss.  Continue to increase exercise.  Dietary changes: Increase soluble fiber, decrease simple carbohydrates, decrease saturated fat. Exercise changes: Moderate to vigorous-intensity aerobic activity 150 minutes per week or as tolerated. We will continue to monitor along with PCP/specialists as it pertains to her weight loss journey.  Lab Results  Component Value Date   CHOL  241 (H) 10/03/2020   HDL 72 10/03/2020   LDLCALC 154 (H) 10/03/2020   TRIG 85 10/03/2020   CHOLHDL 3.3 10/03/2020   Lab Results  Component Value Date   ALT 27 10/03/2020   AST 33 10/03/2020   ALKPHOS 99 10/03/2020   BILITOT 0.6 10/03/2020   The 10-year ASCVD risk score Mikey Bussing DC Jr., et al., 2013) is: 1.2%   Values used to calculate the score:     Age: 50 years     Sex: Female     Is Non-Hispanic African American: Yes     Diabetic: No     Tobacco smoker: No     Systolic Blood Pressure: 191 mmHg     Is BP treated: No     HDL Cholesterol: 72 mg/dL     Total Cholesterol: 241 mg/dL  3. Obesity, current BMI 32.8  Course: Theresa Johnson is currently in the action stage of change. As such, her goal is to continue with weight loss efforts.   Nutrition goals: She has agreed to the Category 3 Plan with breakfast and lunch options.   Exercise goals: As is.'  Behavioral modification strategies: increasing water intake, travel eating strategies and avoiding temptations.  Theresa Johnson has agreed to follow-up with our clinic in 3 weeks. She was informed of the importance of frequent follow-up visits to maximize her success with intensive lifestyle modifications for her multiple health conditions.   Objective:   Blood pressure 112/71, pulse 70, temperature 98.4 F (36.9 C), height 5\' 6"  (1.676  m), weight 203 lb (92.1 kg), SpO2 98 %. Body mass index is 32.77 kg/m.  General: Cooperative, alert, well developed, in no acute distress. HEENT: Conjunctivae and lids unremarkable. Cardiovascular: Regular rhythm.  Lungs: Normal work of breathing. Neurologic: No focal deficits.   Lab Results  Component Value Date   CREATININE 0.83 10/03/2020   BUN 9 10/03/2020   NA 139 10/03/2020   K 4.5 10/03/2020   CL 104 10/03/2020   CO2 23 10/03/2020   Lab Results  Component Value Date   ALT 27 10/03/2020   AST 33 10/03/2020   ALKPHOS 99 10/03/2020   BILITOT 0.6 10/03/2020   Lab Results  Component  Value Date   HGBA1C 5.4 10/03/2020   Lab Results  Component Value Date   INSULIN 5.5 10/03/2020   Lab Results  Component Value Date   TSH 1.590 10/03/2020   Lab Results  Component Value Date   CHOL 241 (H) 10/03/2020   HDL 72 10/03/2020   LDLCALC 154 (H) 10/03/2020   TRIG 85 10/03/2020   CHOLHDL 3.3 10/03/2020   Lab Results  Component Value Date   WBC 6.7 10/03/2020   HGB 14.0 10/03/2020   HCT 41.8 10/03/2020   MCV 100 (H) 10/03/2020   PLT 158 10/03/2020   Lab Results  Component Value Date   FERRITIN 234.7 07/03/2020   Attestation Statements:   Reviewed by clinician on day of visit: allergies, medications, problem list, medical history, surgical history, family history, social history, and previous encounter notes.  Time spent on visit including pre-visit chart review and post-visit care and charting was 30 minutes.   I, Water quality scientist, CMA, am acting as Location manager for [employer], DO.  I have reviewed the above documentation for accuracy and completeness, and I agree with the above. Theresa Johnson, D.O.  The [place] was signed into law in 2016 which includes the topic of electronic health records.  This provides immediate access to information in MyChart.  This includes consultation notes, operative notes, office notes, lab results and pathology reports.  If you have any questions about what you read please let us know at your next visit so we can discuss your concerns and take corrective action if need be.  We are right here with you.

## 2020-12-26 NOTE — Progress Notes (Signed)
   Covid-19 Vaccination Clinic  Name:  LOMA DUBUQUE    MRN: [id number] DOB: 12/30/1970  12/26/2020  [name] was observed post Covid-19 immunization for 15 minutes without incident. She was provided with Vaccine Information Sheet and instruction to access the V-Safe system.   Ms. Beirne was instructed to call 911 with any severe reactions post vaccine: Marland Kitchen Difficulty breathing  . Swelling of face and throat  . A fast heartbeat  . A bad rash all over body  . Dizziness and weakness   Immunizations Administered    Name Date Dose VIS Date Route   PFIZER Comrnaty(Gray TOP) Covid-19 Vaccine 12/26/2020  1:26 PM 0.3 mL 07/18/2020 Intramuscular   Manufacturer: [employer], [employer]   Lot: [id number]   NDC: [phone]

## 2020-12-27 DIAGNOSIS — Z124 Encounter for screening for malignant neoplasm of cervix: Secondary | ICD-10-CM | POA: Diagnosis not present

## 2020-12-27 DIAGNOSIS — N9089 Other specified noninflammatory disorders of vulva and perineum: Secondary | ICD-10-CM | POA: Diagnosis not present

## 2020-12-27 DIAGNOSIS — N924 Excessive bleeding in the premenopausal period: Secondary | ICD-10-CM | POA: Diagnosis not present

## 2020-12-27 DIAGNOSIS — D259 Leiomyoma of uterus, unspecified: Secondary | ICD-10-CM | POA: Diagnosis not present

## 2021-01-09 DIAGNOSIS — D259 Leiomyoma of uterus, unspecified: Secondary | ICD-10-CM | POA: Diagnosis not present

## 2021-01-09 DIAGNOSIS — N9089 Other specified noninflammatory disorders of vulva and perineum: Secondary | ICD-10-CM | POA: Diagnosis not present

## 2021-01-09 DIAGNOSIS — N924 Excessive bleeding in the premenopausal period: Secondary | ICD-10-CM | POA: Diagnosis not present

## 2021-01-13 ENCOUNTER — Encounter (INDEPENDENT_AMBULATORY_CARE_PROVIDER_SITE_OTHER): Payer: Self-pay | Admitting: Physician Assistant

## 2021-01-13 ENCOUNTER — Other Ambulatory Visit: Payer: Self-pay

## 2021-01-13 ENCOUNTER — Ambulatory Visit (INDEPENDENT_AMBULATORY_CARE_PROVIDER_SITE_OTHER): Payer: BC Managed Care – PPO | Admitting: Physician Assistant

## 2021-01-13 VITALS — BP 106/68 | HR 86 | Temp 98.6°F | Ht 66.0 in | Wt 197.0 lb

## 2021-01-13 DIAGNOSIS — Z6835 Body mass index (BMI) 35.0-35.9, adult: Secondary | ICD-10-CM

## 2021-01-13 DIAGNOSIS — Z9189 Other specified personal risk factors, not elsewhere classified: Secondary | ICD-10-CM

## 2021-01-13 DIAGNOSIS — E559 Vitamin D deficiency, unspecified: Secondary | ICD-10-CM

## 2021-01-13 DIAGNOSIS — E8881 Metabolic syndrome: Secondary | ICD-10-CM

## 2021-01-13 MED ORDER — VITAMIN D (ERGOCALCIFEROL) 1.25 MG (50000 UNIT) PO CAPS
50000.0000 [IU] | ORAL_CAPSULE | ORAL | 0 refills | Status: DC
Start: 2021-01-13 — End: 2021-02-04

## 2021-01-15 NOTE — Progress Notes (Signed)
Chief Complaint:   OBESITY Theresa Johnson is here to discuss her progress with her obesity treatment plan along with follow-up of her obesity related diagnoses. Theresa Johnson is on the Category 3 Plan with breakfast and lunch options and states she is following her eating plan approximately 80-85% of the time. Theresa Johnson states she is doing pilates for 50 minutes 5 times per week.  Today's visit was #: 7 Starting weight: 227 lbs Starting date: 10/03/2020 Today's weight: 197 lbs Today's date: 01/13/2021 Total lbs lost to date: 30 Total lbs lost since last in-office visit: 6  Interim History: Theresa Johnson continues to do well with weight loss. She does well with breakfast and lunch. She is struggling to keep dinner interesting due to not eating beef or pork. She does not want to journal or use snack calories for oils or sauces.   Subjective:   1. Vitamin D deficiency Theresa Johnson is on Vit D, and she is tolerating it well.  2. Insulin resistance Theresa Johnson is not on medications, and she denies polyphagia. She is exercising regularly.  3. At risk for diabetes mellitus Theresa Johnson is at higher than average risk for developing diabetes due to obesity.   Assessment/Plan:   1. Vitamin D deficiency Low Vitamin D level contributes to fatigue and are associated with obesity, breast, and colon cancer. We will refill prescription Vitamin D for 1 month. Theresa Johnson will follow-up for routine testing of Vitamin D, at least 2-3 times per year to avoid over-replacement.  - Vitamin D, Ergocalciferol, (DRISDOL) 1.25 MG (50000 UNIT) CAPS capsule; Take 1 capsule (50,000 Units total) by mouth every 7 (seven) days.  Dispense: 4 capsule; Refill: 0  2. Insulin resistance Theresa Johnson will continue with her meal plan, and exercise. Theresa Johnson agreed to follow-up with [place] as directed to closely monitor her progress.  3. At risk for diabetes mellitus Theresa Johnson was given approximately 15 minutes of diabetes education and counseling today. We  discussed intensive lifestyle modifications today with an emphasis on weight loss as well as increasing exercise and decreasing simple carbohydrates in her diet. We also reviewed medication options with an emphasis on risk versus benefit of those discussed.   Repetitive spaced learning was employed today to elicit superior memory formation and behavioral change.  4. Class 2 severe obesity with serious comorbidity and body mass index (BMI) of 35.0 to 35.9 in adult, unspecified obesity type (HCC) Theresa Johnson is currently in the action stage of change. As such, her goal is to continue with weight loss efforts. She has agreed to the Category 3 Plan.   We will recheck labs at her next visit.  Exercise goals: As is.  Behavioral modification strategies: meal planning and cooking strategies and keeping healthy foods in the home.  Theresa Johnson has agreed to follow-up with our clinic in 3 weeks. She was informed of the importance of frequent follow-up visits to maximize her success with intensive lifestyle modifications for her multiple health conditions.   Objective:   Blood pressure 106/68, pulse 86, temperature 98.6 F (37 C), height 5\' 6"  (1.676 m), weight 197 lb (89.4 kg), SpO2 98 %. Body mass index is 31.8 kg/m.  General: Cooperative, alert, well developed, in no acute distress. HEENT: Conjunctivae and lids unremarkable. Cardiovascular: Regular rhythm.  Lungs: Normal work of breathing. Neurologic: No focal deficits.   Lab Results  Component Value Date   CREATININE 0.83 10/03/2020   BUN 9 10/03/2020   NA 139 10/03/2020   K 4.5 10/03/2020   CL 104 10/03/2020  CO2 23 10/03/2020   Lab Results  Component Value Date   ALT 27 10/03/2020   AST 33 10/03/2020   ALKPHOS 99 10/03/2020   BILITOT 0.6 10/03/2020   Lab Results  Component Value Date   HGBA1C 5.4 10/03/2020   Lab Results  Component Value Date   INSULIN 5.5 10/03/2020   Lab Results  Component Value Date   TSH 1.590 10/03/2020    Lab Results  Component Value Date   CHOL 241 (H) 10/03/2020   HDL 72 10/03/2020   LDLCALC 154 (H) 10/03/2020   TRIG 85 10/03/2020   CHOLHDL 3.3 10/03/2020   Lab Results  Component Value Date   WBC 6.7 10/03/2020   HGB 14.0 10/03/2020   HCT 41.8 10/03/2020   MCV 100 (H) 10/03/2020   PLT 158 10/03/2020   Lab Results  Component Value Date   FERRITIN 234.7 07/03/2020   Attestation Statements:   Reviewed by clinician on day of visit: allergies, medications, problem list, medical history, surgical history, family history, social history, and previous encounter notes.   [name], am acting as transcriptionist for [employer], PA-C.  I have reviewed the above documentation for accuracy and completeness, and I agree with the above. Abby Potash, PA-C

## 2021-01-20 DIAGNOSIS — N939 Abnormal uterine and vaginal bleeding, unspecified: Secondary | ICD-10-CM | POA: Diagnosis not present

## 2021-01-20 DIAGNOSIS — N924 Excessive bleeding in the premenopausal period: Secondary | ICD-10-CM | POA: Diagnosis not present

## 2021-01-20 DIAGNOSIS — D259 Leiomyoma of uterus, unspecified: Secondary | ICD-10-CM | POA: Diagnosis not present

## 2021-01-20 DIAGNOSIS — N9089 Other specified noninflammatory disorders of vulva and perineum: Secondary | ICD-10-CM | POA: Diagnosis not present

## 2021-01-27 ENCOUNTER — Other Ambulatory Visit: Payer: Self-pay | Admitting: Obstetrics and Gynecology

## 2021-01-27 DIAGNOSIS — D259 Leiomyoma of uterus, unspecified: Secondary | ICD-10-CM

## 2021-02-04 ENCOUNTER — Ambulatory Visit (INDEPENDENT_AMBULATORY_CARE_PROVIDER_SITE_OTHER): Payer: BC Managed Care – PPO | Admitting: Family Medicine

## 2021-02-04 ENCOUNTER — Other Ambulatory Visit: Payer: Self-pay

## 2021-02-04 ENCOUNTER — Encounter (INDEPENDENT_AMBULATORY_CARE_PROVIDER_SITE_OTHER): Payer: Self-pay | Admitting: Family Medicine

## 2021-02-04 VITALS — BP 130/83 | HR 68 | Temp 98.5°F | Ht 66.0 in | Wt 192.0 lb

## 2021-02-04 DIAGNOSIS — E7849 Other hyperlipidemia: Secondary | ICD-10-CM | POA: Diagnosis not present

## 2021-02-04 DIAGNOSIS — E559 Vitamin D deficiency, unspecified: Secondary | ICD-10-CM | POA: Diagnosis not present

## 2021-02-04 DIAGNOSIS — Z9189 Other specified personal risk factors, not elsewhere classified: Secondary | ICD-10-CM | POA: Diagnosis not present

## 2021-02-04 DIAGNOSIS — Z6836 Body mass index (BMI) 36.0-36.9, adult: Secondary | ICD-10-CM

## 2021-02-04 DIAGNOSIS — E8881 Metabolic syndrome: Secondary | ICD-10-CM

## 2021-02-04 MED ORDER — VITAMIN D (ERGOCALCIFEROL) 1.25 MG (50000 UNIT) PO CAPS: 50000.0000 [IU] | ORAL_CAPSULE | ORAL | 0 refills | Status: DC

## 2021-02-05 ENCOUNTER — Other Ambulatory Visit: Payer: Self-pay | Admitting: Obstetrics and Gynecology

## 2021-02-05 DIAGNOSIS — Z1231 Encounter for screening mammogram for malignant neoplasm of breast: Secondary | ICD-10-CM

## 2021-02-05 LAB — VITAMIN D 25 HYDROXY (VIT D DEFICIENCY, FRACTURES): Vit D, 25-Hydroxy: 58.4 ng/mL (ref 30.0–100.0)

## 2021-02-05 LAB — LIPID PANEL
Chol/HDL Ratio: 4.1 ratio (ref 0.0–4.4)
Cholesterol, Total: 199 mg/dL (ref 100–199)
HDL: 49 mg/dL (ref >39)
LDL Chol Calc (NIH): 136 mg/dL — ABNORMAL HIGH (ref 0–99)
Triglycerides: 79 mg/dL (ref 0–149)
VLDL Cholesterol Cal: 14 mg/dL (ref 5–40)

## 2021-02-05 LAB — INSULIN, RANDOM: INSULIN: 6.1 u[IU]/mL (ref 2.6–24.9)

## 2021-02-06 ENCOUNTER — Ambulatory Visit: Payer: BC Managed Care – PPO | Admitting: Obstetrics and Gynecology

## 2021-02-13 ENCOUNTER — Other Ambulatory Visit: Payer: Self-pay

## 2021-02-13 ENCOUNTER — Encounter: Payer: Self-pay | Admitting: *Deleted

## 2021-02-13 ENCOUNTER — Other Ambulatory Visit (INDEPENDENT_AMBULATORY_CARE_PROVIDER_SITE_OTHER): Payer: Self-pay | Admitting: Physician Assistant

## 2021-02-13 ENCOUNTER — Ambulatory Visit
Admission: RE | Admit: 2021-02-13 | Discharge: 2021-02-13 | Disposition: A | Discharge status: Home / Self Care | Payer: BC Managed Care – PPO | Source: Ambulatory Visit | Attending: Obstetrics and Gynecology | Admitting: Obstetrics and Gynecology

## 2021-02-13 DIAGNOSIS — D259 Leiomyoma of uterus, unspecified: Secondary | ICD-10-CM

## 2021-02-13 DIAGNOSIS — N920 Excessive and frequent menstruation with regular cycle: Secondary | ICD-10-CM | POA: Diagnosis not present

## 2021-02-13 DIAGNOSIS — E559 Vitamin D deficiency, unspecified: Secondary | ICD-10-CM

## 2021-02-13 HISTORY — PX: IR RADIOLOGIST EVAL & MGMT: IMG5224

## 2021-02-13 NOTE — Consult Note (Signed)
Chief Complaint: Patient was seen in consultation today for uterine fibroids and menorrhagia  at the request of Cousins,Sheronette  Referring Physician(s): Cousins,Sheronette  History of Present Illness: Theresa Johnson is a 50 y.o. female with uterine fibroids and heavy menstrual bleeding.  The heavy menstrual bleeding started in 2019 and the patient was living in [place] at the time.  She was scheduled to have a hysterectomy in March 2020 but that was canceled due to [place] and because she moved to [place].  Patient also had diagnostic hysteroscopy with resection of endometrial polyp and D&C in 2020.  Patient has been dealing with heavy irregular menstrual bleeding since 2019.  Along with the heavy bleeding she has significant pain and cramping right before the bleeding starts.  At this time, the bleeding is irregular and the bleeding duration is variable.  Patient is essentially homebound when she has bleeding.  Pregnancy history is G1P0.  Patient noticed a lump on the right side of her vagina when she was inserting a tampon and this has been evaluated by Dr. Garwin Brothers.  Endometrial biopsy from 01/20/2021 demonstrated scattered small fragments of benign epithelial mucosa with hemorrhage.  Biopsy of the reported Bartholin gland cyst demonstrated squamous mucosa and fragments of fibromuscular soft tissue without cyst or malignancy.  Cytology report collected on 12/27/2000 demonstrated atypical squamous cells of undetermined determine significance.  Patient's health is doing well with exception of the heavy menstrual bleeding.  She has been losing weight and many of her musculoskeletal pains have resolved since she lost weight.  Past Medical History:  Diagnosis Date   Anxiety    Back pain    Joint pain    Lactose intolerance    Lymphedema    SOBOE (shortness of breath on exertion)    Swelling of both lower extremities    Vitamin D deficiency     Past Surgical History:   Procedure Laterality Date   BREAST BIOPSY      Allergies: Lactose intolerance (gi)  Medications: Prior to Admission medications   Medication Sig Start Date End Date Taking? Authorizing Provider  ASHWAGANDHA PO Take by mouth.    [provider]  cetirizine (ZYRTEC) 10 MG chewable tablet Chew 10 mg by mouth daily.    [provider]  Collagenase POWD by Does not apply route.    [provider]  COVID-19 mRNA Vac-TriS, Pfizer, (PFIZER-BIONT COVID-19 VAC-TRIS) SUSP injection Inject into the muscle. 12/26/20   Carlyle Basques, MD  Glucosamine 500 MG CAPS Take by mouth.    [provider]  Lactobacillus (ACIDOPHILUS PO) Take by mouth.    [provider]  Lactobacillus TABS Take by mouth.    [provider]  magnesium gluconate (MAGONATE) 500 MG tablet Take by mouth.    [provider]  Multiple Vitamins-Iron TABS Take by mouth.    [provider]  Turmeric 500 MG TABS Take by mouth.    [provider]  Vitamin D, Ergocalciferol, (DRISDOL) 1.25 MG (50000 UNIT) CAPS capsule Take 1 capsule (50,000 Units total) by mouth every 7 (seven) days. 02/04/21   Mellody Dance, DO     Family History  Problem Relation Age of Onset   Depression Mother    Arthritis Mother    Hyperlipidemia Mother    Hypertension Mother    Cancer Mother    Obesity Mother     Social History   Socioeconomic History   Marital status: Married    Spouse name: Tawnya Crook  Number of children: 0   Years of education: Not on file   Highest education level: Not on file  Occupational History   Occupation: Futures trader  Tobacco Use   Smoking status: Never   Smokeless tobacco: Never  Substance and Sexual Activity   Alcohol use: Not on file   Drug use: Not on file   Sexual activity: Not on file  Other Topics Concern   Not on file  Social History Narrative   Not on file   Social Determinants of Health   Financial Resource  Strain: Not on file  Food Insecurity: Not on file  Transportation Needs: Not on file  Physical Activity: Not on file  Stress: Not on file  Social Connections: Not on file    Review of Systems  Constitutional: Negative.   Genitourinary:  Positive for menstrual problem, pelvic pain and vaginal bleeding.   Vital Signs: BP 122/70 (BP Location: Right Arm)   Pulse 85   SpO2 97%   Physical Exam Constitutional:      Appearance: Normal appearance.  Cardiovascular:     Rate and Rhythm: Normal rate and regular rhythm.     Pulses: Normal pulses.     Heart sounds: Normal heart sounds.  Pulmonary:     Effort: Pulmonary effort is normal.     Breath sounds: Normal breath sounds.  Abdominal:     General: Abdomen is flat. There is no distension.     Palpations: Abdomen is soft.     Tenderness: There is no abdominal tenderness.  Neurological:     Mental Status: She is alert.        Imaging: Outside ultrasound report 01/09/2021: Anteverted uterus with heterogeneous echotexture.  Multiple uterine fibroids largest measures 6.9 cm in the fundal intramural region.  Complex avascular area in the vagina measuring up to 3.6 cm.  Endometrial stripe measures 7 mm.  Normal appearance of the ovaries.  Labs:  CBC: Recent Labs    07/03/20 0953 10/03/20 1213  WBC 8.8 6.7  HGB 13.5 14.0  HCT 40.6 41.8  PLT 254.0 158    COAGS: No results for input(s): INR, APTT in the last 8760 hours.  BMP: Recent Labs    07/03/20 0953 10/03/20 1213  NA 137 139  K 3.6 4.5  CL 102 104  CO2 29 23  GLUCOSE 92 86  BUN 12 9  CALCIUM 9.0 9.2  CREATININE 0.85 0.83  GFRNONAA  --  82  GFRAA  --  94    LIVER FUNCTION TESTS: Recent Labs    07/03/20 0953 10/03/20 1213  BILITOT 0.8 0.6  AST 23 33  ALT 23 27  ALKPHOS 76 99  PROT 7.2 7.3  ALBUMIN 3.9 4.3    TUMOR MARKERS: No results for input(s): AFPTM, CEA, CA199, CHROMGRNA in the last 8760 hours.  Assessment and Plan:  50 year old with  uterine fibroids and menorrhagia.  Patient also has an indeterminate vulvar mass and being referred to gynecology oncology.  Reportedly, the patient is supposed to have a pelvic MRI to evaluate the vulvar mass.  We discussed uterine artery embolization procedure in depth.  Discussed post embolization syndrome and the risks of the procedure which include but not limited to bleeding, infection, vascular injury, and persistent bleeding.  Based on the patient's symptoms and ultrasound report, she appears to be a good candidate for uterine artery embolization.  However, patient needs to have the vulvar mass evaluated first.  Hopefully we can evaluate the fibroids at the  same time as the vulvar mass with MRI.  After the MRI, I will evaluate the fibroids and make sure she is a candidate for uterine artery embolization.  We will contact the patient after her gynecology oncology evaluation and MRI.  Thank you for this interesting consult.  I greatly enjoyed meeting JACLIN FINKS and look forward to participating in their care.  A copy of this report was sent to the requesting provider on this date.  Electronically Signed: Burman Riis 02/13/2021, 3:36 PM   I spent a total of  40 Minutes   in face to face in clinical consultation, greater than 50% of which was counseling/coordinating care for uterine fibroids and menorrhagia.

## 2021-02-13 NOTE — Progress Notes (Signed)
Chief Complaint:   OBESITY Theresa Johnson is here to discuss her progress with her obesity treatment plan along with follow-up of her obesity related diagnoses.   Today's visit was #: 8 Starting weight: 227 lbs Starting date: 10/03/220 Today's weight: 192 lbs Today's date: 02/04/2021 Weight change since last visit: 5 lbs Total lbs lost to date: 35 lbs Body mass index is 30.99 kg/m.  Total weight loss percentage to date: -15.42%  Interim History:  Theresa Johnson just came back from vacation in [place] with friends.  Even worked out on vacation and followed plan most of the time.  Happy with results today.  Current Meal Plan: the Category 3 Plan for 70% of the time.  Current Exercise Plan: Pilates for 40 minutes 5 times per week.  Assessment/Plan:   Orders Placed This Encounter  Procedures   Insulin, random   Lipid panel   VITAMIN D 25 Hydroxy (Vit-D Deficiency, Fractures)   Medications Discontinued During This Encounter  Medication Reason   Vitamin D, Ergocalciferol, (DRISDOL) 1.25 MG (50000 UNIT) CAPS capsule Reorder   Meds ordered this encounter  Medications   Vitamin D, Ergocalciferol, (DRISDOL) 1.25 MG (50000 UNIT) CAPS capsule    Sig: Take 1 capsule (50,000 Units total) by mouth every 7 (seven) days.    Dispense:  4 capsule    Refill:  0    Ov for RF-   1. Insulin resistance At goal. Goal is HgbA1c < 5.7, fasting insulin closer to 5.  Medication: None.    Plan:  She will continue to focus on protein-rich, low simple carbohydrate foods. We reviewed the importance of hydration, regular exercise for stress reduction, and restorative sleep.   Lab Results  Component Value Date   HGBA1C 5.4 10/03/2020   Lab Results  Component Value Date   INSULIN 6.1 02/04/2021   INSULIN 5.5 10/03/2020   - Insulin, random  2. Other hyperlipidemia Course: Not at goal. Lipid-lowering medications: None.   Plan: Dietary changes: Increase soluble fiber, decrease simple carbohydrates,  decrease saturated fat. Exercise changes: Moderate to vigorous-intensity aerobic activity 150 minutes per week or as tolerated. We will continue to monitor along with PCP/specialists as it pertains to her weight loss journey.  Lab Results  Component Value Date   CHOL 199 02/04/2021   HDL 49 02/04/2021   LDLCALC 136 (H) 02/04/2021   TRIG 79 02/04/2021   CHOLHDL 4.1 02/04/2021   Lab Results  Component Value Date   ALT 27 10/03/2020   AST 33 10/03/2020   ALKPHOS 99 10/03/2020   BILITOT 0.6 10/03/2020   The 10-year ASCVD risk score Mikey Bussing DC Jr., et al., 2013) is: 2.9%   Values used to calculate the score:     Age: 50 years     Sex: Female     Is Non-Hispanic African American: Yes     Diabetic: No     Tobacco smoker: No     Systolic Blood Pressure: 119 mmHg     Is BP treated: No     HDL Cholesterol: 49 mg/dL     Total Cholesterol: 199 mg/dL  - Lipid panel  3. Vitamin D deficiency At goal.  She is taking vitamin D 50,000 IU weekly.  Plan: Continue to take prescription Vitamin D @50 ,000 IU every week as prescribed.  Will check vitamin D level today, as per below.  Lab Results  Component Value Date   VD25OH 58.4 02/04/2021   VD25OH 25.7 (L) 10/03/2020   VD25OH 23.35 (L)  07/03/2020   - Refill Vitamin D, Ergocalciferol, (DRISDOL) 1.25 MG (50000 UNIT) CAPS capsule; Take 1 capsule (50,000 Units total) by mouth every 7 (seven) days.  Dispense: 4 capsule; Refill: 0 - VITAMIN D 25 Hydroxy (Vit-D Deficiency, Fractures)  4. At risk for impaired metabolic function Due to Alexine's current state of health and medical condition(s), she is at a significantly higher risk for impaired metabolic function.  At least 9 minutes was spent on counseling Galileah about these concerns today.  This places the patient at a much greater risk to subsequently develop cardio-pulmonary conditions that can negatively affect the patient's quality of life.  I stressed the importance of reversing these risks  factors.  The initial goal is to lose at least 5-10% of starting weight to help reduce risk factors.  Counseling:  Intensive lifestyle modifications discussed with [name] as the most appropriate first line treatment.  she will continue to work on diet, exercise, and weight loss efforts.  We will continue to reassess these conditions on a fairly regular basis in an attempt to decrease the patient's overall morbidity and mortality.  5. Obesity with Current BMI is 30.99.  Course: [name] is currently in the action stage of change. As such, her goal is to continue with weight loss efforts.   Nutrition goals: She has agreed to the Category 3 Plan and keeping a food journal and adhering to recommended goals of 600-700 calories and 45+ grams of protein at dinner.   Exercise goals:  As is.  Behavioral modification strategies: meal planning and cooking strategies.  [name] has agreed to follow-up with our clinic in 2-3 weeks. She was informed of the importance of frequent follow-up visits to maximize her success with intensive lifestyle modifications for her multiple health conditions.   Objective:   Blood pressure 130/83, pulse 68, temperature 98.5 F (36.9 C), height 5\' 6"  (1.676 m), weight 192 lb (87.1 kg), SpO2 99 %. Body mass index is 30.99 kg/m.  General: Cooperative, alert, well developed, in no acute distress. HEENT: Conjunctivae and lids unremarkable. Cardiovascular: Regular rhythm.  Lungs: Normal work of breathing. Neurologic: No focal deficits.   Lab Results  Component Value Date   CREATININE 0.83 10/03/2020   BUN 9 10/03/2020   NA 139 10/03/2020   K 4.5 10/03/2020   CL 104 10/03/2020   CO2 23 10/03/2020   Lab Results  Component Value Date   ALT 27 10/03/2020   AST 33 10/03/2020   ALKPHOS 99 10/03/2020   BILITOT 0.6 10/03/2020   Lab Results  Component Value Date   HGBA1C 5.4 10/03/2020   Lab Results  Component Value Date   INSULIN 6.1 02/04/2021   INSULIN 5.5  10/03/2020   Lab Results  Component Value Date   TSH 1.590 10/03/2020   Lab Results  Component Value Date   CHOL 199 02/04/2021   HDL 49 02/04/2021   LDLCALC 136 (H) 02/04/2021   TRIG 79 02/04/2021   CHOLHDL 4.1 02/04/2021   Lab Results  Component Value Date   VD25OH 58.4 02/04/2021   VD25OH 25.7 (L) 10/03/2020   VD25OH 23.35 (L) 07/03/2020   Lab Results  Component Value Date   WBC 6.7 10/03/2020   HGB 14.0 10/03/2020   HCT 41.8 10/03/2020   MCV 100 (H) 10/03/2020   PLT 158 10/03/2020   Lab Results  Component Value Date   FERRITIN 234.7 07/03/2020   Attestation Statements:   Reviewed by clinician on day of visit: allergies, medications, problem list, medical  history, surgical history, family history, social history, and previous encounter notes.  I, Water quality scientist, CMA, am acting as Location manager for [employer], DO.  I have reviewed the above documentation for accuracy and completeness, and I agree with the above. Marjory Sneddon, D.O.  The [place] was signed into law in 2016 which includes the topic of electronic health records.  This provides immediate access to information in MyChart.  This includes consultation notes, operative notes, office notes, lab results and pathology reports.  If you have any questions about what you read please let us know at your next visit so we can discuss your concerns and take corrective action if need be.  We are right here with you.

## 2021-02-13 NOTE — Telephone Encounter (Signed)
Pt last seen by Dr. Opalski.  

## 2021-02-17 DIAGNOSIS — N939 Abnormal uterine and vaginal bleeding, unspecified: Secondary | ICD-10-CM | POA: Diagnosis not present

## 2021-02-17 DIAGNOSIS — N898 Other specified noninflammatory disorders of vagina: Secondary | ICD-10-CM | POA: Diagnosis not present

## 2021-02-20 ENCOUNTER — Ambulatory Visit (INDEPENDENT_AMBULATORY_CARE_PROVIDER_SITE_OTHER): Payer: BC Managed Care – PPO | Admitting: Physician Assistant

## 2021-02-20 ENCOUNTER — Other Ambulatory Visit: Payer: Self-pay

## 2021-02-20 ENCOUNTER — Encounter (INDEPENDENT_AMBULATORY_CARE_PROVIDER_SITE_OTHER): Payer: Self-pay | Admitting: Physician Assistant

## 2021-02-20 VITALS — BP 117/68 | HR 97 | Temp 98.6°F | Ht 66.0 in | Wt 189.0 lb

## 2021-02-20 DIAGNOSIS — E66812 Obesity, class 2: Secondary | ICD-10-CM

## 2021-02-20 DIAGNOSIS — E7849 Other hyperlipidemia: Secondary | ICD-10-CM

## 2021-02-20 DIAGNOSIS — E559 Vitamin D deficiency, unspecified: Secondary | ICD-10-CM | POA: Diagnosis not present

## 2021-02-20 DIAGNOSIS — Z6836 Body mass index (BMI) 36.0-36.9, adult: Secondary | ICD-10-CM | POA: Diagnosis not present

## 2021-02-20 DIAGNOSIS — Z9189 Other specified personal risk factors, not elsewhere classified: Secondary | ICD-10-CM | POA: Diagnosis not present

## 2021-02-20 MED ORDER — VITAMIN D3 50 MCG (2000 UT) PO CAPS: 4000.0000 [IU] | ORAL_CAPSULE | Freq: Every day | ORAL | 0 refills | Status: DC

## 2021-03-03 NOTE — Progress Notes (Signed)
Chief Complaint:   OBESITY Theresa Johnson is here to discuss her progress with her obesity treatment plan along with follow-up of her obesity related diagnoses. Theresa Johnson is on the Category 3 Plan and states she is following her eating plan approximately 85% of the time. Theresa Johnson states she is doing pilates for 50 minutes 7 times per week.  Today's visit was #: 9 Starting weight: 227 lbs Starting date: 10/03/2020 Today's weight: 189 lbs Today's date: 02/20/2021 Total lbs lost to date: 38 Total lbs lost since last in-office visit: 3  Interim History: Theresa Johnson states that she did not journal over the past few weeks. Her husband is on the same plan and she does the cooking so she has been following Category 3 only.  Subjective:   1. Vitamin D deficiency Theresa's last Vit D level was at goal. I discussed labs with the patient today.  2. Other hyperlipidemia Theresa Johnson's lipid panel has improved overall. Her HDL has decreased but she is going to increase her exercise. I discussed labs with the patient today.  3. At risk for heart disease Theresa Johnson is at a higher than average risk for cardiovascular disease due to obesity.   Assessment/Plan:   1. Vitamin D deficiency Low Vitamin D level contributes to fatigue and are associated with obesity, breast, and colon cancer. Theresa Johnson agreed to change to OTC Vitamin D 4,000 units daily and will follow-up for routine testing of Vitamin D, at least 2-3 times per year to avoid over-replacement.  - Cholecalciferol (VITAMIN D3) 50 MCG (2000 UT) capsule; Take 2 capsules (4,000 Units total) by mouth daily.  Dispense: 30 capsule; Refill: 0  2. Other hyperlipidemia Cardiovascular risk and specific lipid/LDL goals reviewed. We discussed several lifestyle modifications today. Solmarie will continue her meal plan and increase exercise. Orders and follow up as documented in patient record.   Counseling Intensive lifestyle modifications are the first line treatment for  this issue. Dietary changes: Increase soluble fiber. Decrease simple carbohydrates. Exercise changes: Moderate to vigorous-intensity aerobic activity 150 minutes per week if tolerated. Lipid-lowering medications: see documented in medical record.  3. At risk for heart disease Theresa Johnson was given approximately 15 minutes of coronary artery disease prevention counseling today. She is 50 y.o. female and has risk factors for heart disease including obesity. We discussed intensive lifestyle modifications today with an emphasis on specific weight loss instructions and strategies.   Repetitive spaced learning was employed today to elicit superior memory formation and behavioral change.  4. Obesity with Current BMI is 30.52 Theresa Johnson is currently in the action stage of change. As such, her goal is to continue with weight loss efforts. She has agreed to the Category 3 Plan.   Exercise goals: As is.  Behavioral modification strategies: meal planning and cooking strategies and keeping healthy foods in the home.  Theresa Johnson has agreed to follow-up with our clinic in 3 weeks. She was informed of the importance of frequent follow-up visits to maximize her success with intensive lifestyle modifications for her multiple health conditions.   Objective:   Blood pressure 117/68, pulse 97, temperature 98.6 F (37 C), height '5\' 6"'$  (1.676 m), weight 189 lb (85.7 kg), SpO2 96 %. Body mass index is 30.51 kg/m.  General: Cooperative, alert, well developed, in no acute distress. HEENT: Conjunctivae and lids unremarkable. Cardiovascular: Regular rhythm.  Lungs: Normal work of breathing. Neurologic: No focal deficits.   Lab Results  Component Value Date   CREATININE 0.83 10/03/2020   BUN 9 10/03/2020  NA 139 10/03/2020   K 4.5 10/03/2020   CL 104 10/03/2020   CO2 23 10/03/2020   Lab Results  Component Value Date   ALT 27 10/03/2020   AST 33 10/03/2020   ALKPHOS 99 10/03/2020   BILITOT 0.6 10/03/2020    Lab Results  Component Value Date   HGBA1C 5.4 10/03/2020   Lab Results  Component Value Date   INSULIN 6.1 02/04/2021   INSULIN 5.5 10/03/2020   Lab Results  Component Value Date   TSH 1.590 10/03/2020   Lab Results  Component Value Date   CHOL 199 02/04/2021   HDL 49 02/04/2021   LDLCALC 136 (H) 02/04/2021   TRIG 79 02/04/2021   CHOLHDL 4.1 02/04/2021   Lab Results  Component Value Date   VD25OH 58.4 02/04/2021   VD25OH 25.7 (L) 10/03/2020   VD25OH 23.35 (L) 07/03/2020   Lab Results  Component Value Date   WBC 6.7 10/03/2020   HGB 14.0 10/03/2020   HCT 41.8 10/03/2020   MCV 100 (H) 10/03/2020   PLT 158 10/03/2020   Lab Results  Component Value Date   FERRITIN 234.7 07/03/2020   Attestation Statements:   Reviewed by clinician on day of visit: allergies, medications, problem list, medical history, surgical history, family history, social history, and previous encounter notes.   Wilhemena Johnson, am acting as transcriptionist for [employer], PA-C.  I have reviewed the above documentation for accuracy and completeness, and I agree with the above. Abby Potash, PA-C

## 2021-03-11 DIAGNOSIS — R188 Other ascites: Secondary | ICD-10-CM | POA: Diagnosis not present

## 2021-03-11 DIAGNOSIS — D259 Leiomyoma of uterus, unspecified: Secondary | ICD-10-CM | POA: Diagnosis not present

## 2021-03-11 DIAGNOSIS — N907 Vulvar cyst: Secondary | ICD-10-CM | POA: Diagnosis not present

## 2021-03-12 ENCOUNTER — Other Ambulatory Visit: Payer: Self-pay

## 2021-03-12 ENCOUNTER — Encounter (INDEPENDENT_AMBULATORY_CARE_PROVIDER_SITE_OTHER): Payer: Self-pay | Admitting: Family Medicine

## 2021-03-12 ENCOUNTER — Ambulatory Visit (INDEPENDENT_AMBULATORY_CARE_PROVIDER_SITE_OTHER): Payer: BC Managed Care – PPO | Admitting: Family Medicine

## 2021-03-12 VITALS — BP 129/87 | HR 84 | Temp 98.6°F | Ht 66.0 in | Wt 188.0 lb

## 2021-03-12 DIAGNOSIS — Z6836 Body mass index (BMI) 36.0-36.9, adult: Secondary | ICD-10-CM

## 2021-03-12 DIAGNOSIS — E559 Vitamin D deficiency, unspecified: Secondary | ICD-10-CM | POA: Diagnosis not present

## 2021-03-14 ENCOUNTER — Ambulatory Visit
Admission: RE | Admit: 2021-03-14 | Discharge: 2021-03-14 | Disposition: A | Discharge status: Home / Self Care | Payer: Self-pay | Source: Ambulatory Visit | Attending: Obstetrics and Gynecology | Admitting: Obstetrics and Gynecology

## 2021-03-14 ENCOUNTER — Other Ambulatory Visit: Payer: Self-pay | Admitting: Obstetrics and Gynecology

## 2021-03-14 DIAGNOSIS — Z1231 Encounter for screening mammogram for malignant neoplasm of breast: Secondary | ICD-10-CM

## 2021-03-17 NOTE — Progress Notes (Signed)
Chief Complaint:   OBESITY Theresa Johnson is here to discuss her progress with her obesity treatment plan along with follow-up of her obesity related diagnoses. Theresa Johnson is on the Category 3 Plan and states she is following her eating plan approximately 75% of the time. Theresa Johnson states she is doing Pilates 60 minutes 7 times per week.  Today's visit was #: 10 Starting weight: 227 lbs Starting date: 10/03/2020 Today's weight: 188 lbs Today's date: 03/12/2021 Total lbs lost to date: 39 Total lbs lost since last in-office visit: 1  Interim History: Theresa Johnson has been dealing with endometriosis and is having terrible bleeding and pain. She has been unable to eat well the past couple of weeks. She is feeling a little bored with dinner options as well. She wasn't able to exercise at all the past few weeks.  Assessment/Plan:   1. Vitamin D deficiency At goal.   Plan: Continue to take prescription Vitamin D '@50'$ ,000 IU every week as prescribed.  Follow-up for routine testing of Vitamin D, at least 2-3 times per year to avoid over-replacement. No refill needed at this time.  Lab Results  Component Value Date   VD25OH 58.4 02/04/2021   VD25OH 25.7 (L) 10/03/2020   VD25OH 23.35 (L) 07/03/2020   2. Obesity with current BMI of 30.4  Theresa Johnson is currently in the action stage of change. As such, her goal is to continue with weight loss efforts. She has agreed to the Category 3 Plan but ADDING journaling for dinner only and adhering to recommended goals of 600-700 calories and 45+ grams protein for that meal.   Handouts: Recipe Ideas  Exercise goals: For substantial health benefits, adults should do at least 150 minutes (2 hours and 30 minutes) a week of moderate-intensity, or 75 minutes (1 hour and 15 minutes) a week of vigorous-intensity aerobic physical activity, or an equivalent combination of moderate- and vigorous-intensity aerobic activity. Aerobic activity should be performed in episodes of at  least 10 minutes, and preferably, it should be spread throughout the week.  Behavioral modification strategies: meal planning and cooking strategies.   Theresa Johnson has agreed to follow-up with our clinic in 3 weeks. She was informed of the importance of frequent follow-up visits to maximize her success with intensive lifestyle modifications for her multiple health conditions.   Objective:   Blood pressure 129/87, pulse 84, temperature 98.6 F (37 C), height '5\' 6"'$  (1.676 m), weight 188 lb (85.3 kg), SpO2 97 %. Body mass index is 30.34 kg/m.  General: Cooperative, alert, well developed, in no acute distress. HEENT: Conjunctivae and lids unremarkable. Cardiovascular: Regular rhythm.  Lungs: Normal work of breathing. Neurologic: No focal deficits.   Lab Results  Component Value Date   CREATININE 0.83 10/03/2020   BUN 9 10/03/2020   NA 139 10/03/2020   K 4.5 10/03/2020   CL 104 10/03/2020   CO2 23 10/03/2020   Lab Results  Component Value Date   ALT 27 10/03/2020   AST 33 10/03/2020   ALKPHOS 99 10/03/2020   BILITOT 0.6 10/03/2020   Lab Results  Component Value Date   HGBA1C 5.4 10/03/2020   Lab Results  Component Value Date   INSULIN 6.1 02/04/2021   INSULIN 5.5 10/03/2020   Lab Results  Component Value Date   TSH 1.590 10/03/2020   Lab Results  Component Value Date   CHOL 199 02/04/2021   HDL 49 02/04/2021   LDLCALC 136 (H) 02/04/2021   TRIG 79 02/04/2021   CHOLHDL 4.1  02/04/2021   Lab Results  Component Value Date   VD25OH 58.4 02/04/2021   VD25OH 25.7 (L) 10/03/2020   VD25OH 23.35 (L) 07/03/2020   Lab Results  Component Value Date   WBC 6.7 10/03/2020   HGB 14.0 10/03/2020   HCT 41.8 10/03/2020   MCV 100 (H) 10/03/2020   PLT 158 10/03/2020   Lab Results  Component Value Date   FERRITIN 234.7 07/03/2020    Attestation Statements:   Reviewed by clinician on day of visit: allergies, medications, problem list, medical history, surgical history,  family history, social history, and previous encounter notes.  Patient was in the office today for over 30 minutes.  Over 50% of the time I spent with patient in face to face counseling re: treatment plans of medical conditions possibly including medicine management and/or lifestyle modification, strategies to improve health and well being; and in coordination of care.  SEE TREATMENT PLAN FOR DETAILS.    Coral Ceo, CMA, am acting as transcriptionist for [employer], DO.  I have reviewed the above documentation for accuracy and completeness, and I agree with the above. Theresa Johnson, D.O.  The [place] was signed into law in 2016 which includes the topic of electronic health records.  This provides immediate access to information in MyChart.  This includes consultation notes, operative notes, office notes, lab results and pathology reports.  If you have any questions about what you read please let us know at your next visit so we can discuss your concerns and take corrective action if need be.  We are right here with you.

## 2021-03-22 ENCOUNTER — Other Ambulatory Visit (INDEPENDENT_AMBULATORY_CARE_PROVIDER_SITE_OTHER): Payer: Self-pay | Admitting: Family Medicine

## 2021-03-22 DIAGNOSIS — E559 Vitamin D deficiency, unspecified: Secondary | ICD-10-CM

## 2021-03-27 ENCOUNTER — Ambulatory Visit (INDEPENDENT_AMBULATORY_CARE_PROVIDER_SITE_OTHER): Payer: BC Managed Care – PPO | Admitting: Family Medicine

## 2021-03-27 ENCOUNTER — Telehealth: Payer: Self-pay

## 2021-03-27 ENCOUNTER — Encounter (INDEPENDENT_AMBULATORY_CARE_PROVIDER_SITE_OTHER): Payer: Self-pay

## 2021-03-27 ENCOUNTER — Other Ambulatory Visit (HOSPITAL_COMMUNITY): Payer: Self-pay | Admitting: Diagnostic Radiology

## 2021-03-27 ENCOUNTER — Encounter: Payer: Self-pay | Admitting: Physician Assistant

## 2021-03-27 ENCOUNTER — Telehealth: Payer: BC Managed Care – PPO | Admitting: Physician Assistant

## 2021-03-27 DIAGNOSIS — U071 COVID-19: Secondary | ICD-10-CM | POA: Diagnosis not present

## 2021-03-27 DIAGNOSIS — D259 Leiomyoma of uterus, unspecified: Secondary | ICD-10-CM

## 2021-03-27 MED ORDER — BENZONATATE 100 MG PO CAPS: 100.0000 mg | ORAL_CAPSULE | Freq: Three times a day (TID) | ORAL | 0 refills | Status: DC | PRN

## 2021-03-27 MED ORDER — MOLNUPIRAVIR EUA 200MG CAPSULE: 4.0000 | ORAL_CAPSULE | Freq: Two times a day (BID) | ORAL | 0 refills | Status: AC

## 2021-03-27 NOTE — Telephone Encounter (Signed)
Patient states that she had a e-visit and was prescribed medication. Patient will pick up medication today for cough. Patient advise per protocol below.   If cough remains the same or better: continue to treat with over the counter medications. Hard candy or cough drops and drinking warm fluids. Adults can also use honey 2 tsp (10 ML) at bedtime.   HONEY IS NOT RECOMMENDED FOR INFANTS UNDER ONE.   If cough is becoming worse even with the use of over the counter medications and patient is not able to sleep at night, cough becomes productive with sputum that maybe yellow or green in color, contact PCP.

## 2021-03-27 NOTE — Progress Notes (Signed)
Virtual Visit Consent   Theresa Johnson, you are scheduled for a virtual visit with a [facility] provider today.     Just as with appointments in the office, your consent must be obtained to participate.  Your consent will be active for this visit and any virtual visit you may have with one of our providers in the next 365 days.     If you have a MyChart account, a copy of this consent can be sent to you electronically.  All virtual visits are billed to your insurance company just like a traditional visit in the office.    As this is a virtual visit, video technology does not allow for your provider to perform a traditional examination.  This may limit your provider's ability to fully assess your condition.  If your provider identifies any concerns that need to be evaluated in person or the need to arrange testing (such as labs, EKG, etc.), we will make arrangements to do so.     Although advances in technology are sophisticated, we cannot ensure that it will always work on either your end or our end.  If the connection with a video visit is poor, the visit may have to be switched to a telephone visit.  With either a video or telephone visit, we are not always able to ensure that we have a secure connection.     I need to obtain your verbal consent now.   Are you willing to proceed with your visit today?    Theresa Johnson has provided verbal consent on 03/27/2021 for a virtual visit (video or telephone).   Mar Daring, PA-C   Date: 03/27/2021 11:28 AM   Virtual Visit via Video Note   I, Mar Daring, connected with  Theresa Johnson  ([id number], April 08, 1971) on 03/27/21 at 11:15 AM EDT by a video-enabled telemedicine application and verified that I am speaking with the correct person using two identifiers.  Location: Patient: Virtual Visit Location Patient: Home Provider: Virtual Visit Location Provider: Home Office   I discussed the limitations of  evaluation and management by telemedicine and the availability of in person appointments. The patient expressed understanding and agreed to proceed.    History of Present Illness: Theresa Johnson is a 50 y.o. who identifies as a female who was assigned female at birth, and is being seen today for Covid 6.  HPI: URI  This is a new problem. Episode onset: tested positive yesterday for Covid 19, symptoms began started 2 days ago. The problem has been gradually worsening. Maximum temperature: 99. Associated symptoms include congestion, coughing (dry), headaches and a sore throat. Pertinent negatives include no diarrhea, ear pain, nausea, plugged ear sensation, rhinorrhea, sinus pain or vomiting. Associated symptoms comments: Mild chills and sweating, body aches, fatigue, post nasal drainage. She has tried increased fluids and sleep (throat lozenges) for the symptoms. The treatment provided mild relief.    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   Venous insufficiency of both  lower extremities 06/13/2018   Vitamin D deficiency 03/13/2016   Abnormal Pap smear of cervix 12/25/2013   Cervical stenosis (uterine cervix) 12/25/2013   Endometrial polyp 12/25/2013   Fibroids 12/25/2013   Allergic rhinitis 06/20/2008   Breast nodule 06/20/2008   Family history of diabetes mellitus (DM) 06/20/2008   FH: colon cancer 06/20/2008   Knee pain 06/20/2008    Allergies:  Allergies  Allergen  Reactions   Lactose Intolerance (Gi)    Medications:  Current Outpatient Medications:    benzonatate (TESSALON) 100 MG capsule, Take 1 capsule (100 mg total) by mouth 3 (three) times daily as needed., Disp: 30 capsule, Rfl: 0   molnupiravir EUA 200 mg CAPS, Take 4 capsules (800 mg total) by mouth 2 (two) times daily for 5 days., Disp: 40 capsule, Rfl: 0   Vitamin D, Ergocalciferol, (DRISDOL) 1.25 MG (50000 UNIT) CAPS capsule, Take 50,000 Units by mouth every 7 (seven) days., Disp: , Rfl:   Observations/Objective: Patient is well-developed, well-nourished in no acute distress.  Resting comfortably at home.  Head is normocephalic, atraumatic.  No labored breathing.  Speech is clear and coherent with logical content.  Patient is alert and oriented at baseline.    Assessment and Plan: 1. COVID-19 - MyChart COVID-19 home monitoring program; Future - molnupiravir EUA 200 mg CAPS; Take 4 capsules (800 mg total) by mouth 2 (two) times daily for 5 days.  Dispense: 40 capsule; Refill: 0 - benzonatate (TESSALON) 100 MG capsule; Take 1 capsule (100 mg total) by mouth 3 (three) times daily as needed.  Dispense: 30 capsule; Refill: 0  - Continue OTC symptomatic management of choice - Will send OTC vitamins and supplement information through AVS - Molnupiravir prescribed - Tessalon perles for cough - Patient enrolled in MyChart symptom monitoring - Push fluids - Rest as needed - Discussed return precautions and when to seek in-person evaluation, sent via AVS as well  Follow Up Instructions: I discussed the assessment and treatment plan with the patient. The patient was provided an opportunity to ask questions and all were answered. The patient agreed with the plan and demonstrated an understanding of the instructions.  A copy of instructions were sent to the patient via MyChart.  The patient was advised to call back or seek an in-person evaluation if the symptoms worsen or if the condition fails  to improve as anticipated.  Time:  I spent 21 minutes with the patient via telehealth technology discussing the above problems/concerns.    Mar Daring, PA-C

## 2021-03-27 NOTE — Patient Instructions (Signed)
Hello Theresa Johnson,  You are being placed in the home monitoring program for COVID-19 (commonly known as Coronavirus).  This is because you are suspected to have the virus or are known to have the virus.  If you are unsure which group you fall into call your clinic.    As part of this program, you'll answer a daily questionnaire in the MyChart mobile app. You'll receive a notification through the MyChart app when the questionnaire is available. When you log in to MyChart, you'll see the tasks in your To Do activity.       Clinicians will see any answers that are concerning and take appropriate steps.  If at any point you are having a medical emergency, call 911.  If otherwise concerned call your clinic instead of coming into the clinic or hospital.  To keep from spreading the disease you should: Stay home and limit contact with other people as much as possible.  Wash your hands frequently. Cover your coughs and sneezes with a tissue, and throw used tissues in the trash.   Clean and disinfect frequently touched surfaces and objects.    Take care of yourself by: Staying home Resting Drinking fluids Take fever-reducing medications (Tylenol/Acetaminophen and Ibuprofen)  For more information on the disease go to the Centers for Disease Control and Prevention website     COVID-19: What to Do if You Are Sick CDC has updated isolation and quarantine recommendations for the public, and is revising the CDC website to reflect these changes. These recommendations do not apply to healthcare personnel and do not supersede state, local, tribal, or territorial laws, rules, andregulations. If you have a fever, cough or other symptoms, you might have COVID-19. Most people have mild illness and are able to recover at home. If you are sick: Keep track of your symptoms. If you have an emergency warning sign (including trouble breathing), call 911. Steps to help prevent the spread of COVID-19 if you are sick If  you are sick with COVID-19 or think you might have COVID-19, follow the steps below to care for yourself and to help protect other peoplein your home and community. Stay home except to get medical care Stay home. Most people with COVID-19 have mild illness and can recover at home without medical care. Do not leave your home, except to get medical care. Do not visit public areas. Take care of yourself. Get rest and stay hydrated. Take over-the-counter medicines, such as acetaminophen, to help you feel better. Stay in touch with your doctor. Call before you get medical care. Be sure to get care if you have trouble breathing, or have any other emergency warning signs, or if you think it is an emergency. Avoid public transportation, ride-sharing, or taxis. Separate yourself from other people As much as possible, stay in a specific room and away from other people and pets in your home. If possible, you should use a separate bathroom. If you need to be around other people or animals in oroutside of the home, wear a mask. Tell your close contactsthat they may have been exposed to COVID-19. An infected person can spread COVID-19 starting 48 hours (or 2 days) before the person has any symptoms or tests positive. By letting your close contacts know they may have been exposed to COVID-19, you are helping to protect everyone. Additional guidance is available for those living in close quarters and shared housing. See COVID-19 and Animals if you have questions about pets. If you are diagnosed with  COVID-19, someone from the health department may call you. Answer the call to slow the spread. Monitor your symptoms Symptoms of COVID-19 include fever, cough, or other symptoms. Follow care instructions from your healthcare provider and local health department. Your local health authorities may give instructions on checking your symptoms and reporting information. When to seek emergency medical attention Look for  emergency warning signs* for COVID-19. If someone is showing any of these signs, seek emergency medical care immediately: Trouble breathing Persistent pain or pressure in the chest New confusion Inability to wake or stay awake Pale, gray, or blue-colored skin, lips, or nail beds, depending on skin tone *This list is not all possible symptoms. Please call your medical provider forany other symptoms that are severe or concerning to you. Call 911 or call ahead to your local emergency facility: Notify the operator that you are seeking care for someone who has or may haveCOVID-19. Call ahead before visiting your doctor Call ahead. Many medical visits for routine care are being postponed or done by phone or telemedicine. If you have a medical appointment that cannot be postponed, call your doctor's office, and tell them you have or may have COVID-19. This will help the office protect themselves and other patients. Get tested If you have symptoms of COVID-19, get tested. While waiting for test results, you stay away from others, including staying apart from those living in your household. Self-tests are one of several options for testing for the virus that causes COVID-19 and may be more convenient than laboratory-based tests and point-of-care tests. Ask your healthcare provider or your local health department if you need help interpreting your test results. You can visit your state, tribal, local, and territorial health department's website to look for the latest local information on testing sites. If you are sick, wear a mask over your nose and mouth You should wear a mask over your nose and mouth if you must be around other people or animals, including pets (even at home). You don't need to wear the mask if you are alone. If you can't put on a mask (because of trouble breathing, for example), cover your coughs and sneezes in some other way. Try to stay at least 6 feet away from other people. This will  help protect the people around you. Masks should not be placed on young children under age 24 years, anyone who has trouble breathing, or anyone who is not able to remove the mask without help. Note: During the COVID-19 pandemic, medical grade facemasks are reserved forhealthcare workers and some first responders. Cover your coughs and sneezes Cover your mouth and nose with a tissue when you cough or sneeze. Throw away used tissues in a lined trash can. Immediately wash your hands with soap and water for at least 20 seconds. If soap and water are not available, clean your hands with an alcohol-based hand sanitizer that contains at least 60% alcohol. Clean your hands often Wash your hands often with soap and water for at least 20 seconds. This is especially important after blowing your nose, coughing, or sneezing; going to the bathroom; and before eating or preparing food. Use hand sanitizer if soap and water are not available. Use an alcohol-based hand sanitizer with at least 60% alcohol, covering all surfaces of your hands and rubbing them together until they feel dry. Soap and water are the best option, especially if hands are visibly dirty. Avoid touching your eyes, nose, and mouth with unwashed hands. Handwashing Tips Avoid sharing  personal household items Do not share dishes, drinking glasses, cups, eating utensils, towels, or bedding with other people in your home. Wash these items thoroughly after using them with soap and water or put in the dishwasher. Clean all "high-touch" surfaces every day Clean and disinfect high-touch surfaces in your "sick room" and bathroom; wear disposable gloves. Let someone else clean and disinfect surfaces in common areas, but you should clean your bedroom and bathroom, if possible. If a caregiver or other person needs to clean and disinfect a sick person's bedroom or bathroom, they should do so on an as-needed basis. The caregiver/other person should wear a mask  and disposable gloves prior to cleaning. They should wait as long as possible after the person who is sick has used the bathroom before coming in to clean and use the bathroom. High-touch surfaces include phones, remote controls, counters, tabletops, doorknobs, bathroom fixtures, toilets, keyboards, tablets, and bedside tables. Clean and disinfect areas that may have blood, stool, or body fluids on them. Use household cleaners and disinfectants. Clean the area or item with soap and water or another detergent if it is dirty. Then, use a household disinfectant. Be sure to follow the instructions on the label to ensure safe and effective use of the product. Many products recommend keeping the surface wet for several minutes to ensure germs are killed. Many also recommend precautions such as wearing gloves and making sure you have good ventilation during use of the product. Use a product from [employer]: Disinfectants for Coronavirus ([id number]). Complete Disinfection Guidance When you can be around others after being sick with COVID-19 Deciding when you can be around others is different for different situations. Find out when you can safely end home isolation. For any additional questions about your care,contact your healthcare provider or state or local health department. 07/17/2020 Content source: [facility] ([facility]), Division of Viral Diseases This information is not intended to replace advice given to you by your health care provider. Make sure you discuss any questions you have with your healthcare provider. Document Revised: 09/13/2020 Document Reviewed: 09/13/2020 Elsevier Patient Education  2022 [place] are being prescribed MOLNUPIRAVIR for COVID-19 infection.   Please call the pharmacy or go through the drive through vs going inside if you are picking up the mediation yourself to prevent further spread. If prescribed to a [facility] affiliated pharmacy, a pharmacist will bring the medication out to your car.   ADMINISTRATION INSTRUCTIONS: Take with or without food. Swallow the tablets whole. Don't chew, crush, or break the medications because it might not work as well  For each dose of the medication, you should be taking FOUR tablets at one time, TWICE a day   Finish your full five-day course of Molnupiravir even if you feel better before you're done. Stopping this medication too early can make it less effective to prevent severe illness related to [place].    Molnupiravir is prescribed for YOU ONLY. Don't share it with others, even if they have similar symptoms as you. This medication might not be right for everyone.   Make sure to take steps to protect yourself and others while you're taking this medication in order to get well soon and to prevent others from getting sick with COVID-19.   **If you are of childbearing potential (any gender) - it is advised to not get pregnant while taking this medication and recommended that condoms are used for female partners the  next 3 months after taking the medication out of extreme caution    COMMON SIDE EFFECTS: Diarrhea Nausea  Dizziness    If your COVID-19 symptoms get worse, get medical help right away. Call 911 if you experience symptoms such as worsening cough, trouble breathing, chest pain that doesn't go away, confusion, a hard time staying awake, and pale or blue-colored skin. This medication won't prevent all COVID-19 cases from getting worse.   Can take to lessen severity: Vit C '500mg'$  twice daily Quercertin 250-'500mg'$  twice daily Zinc 75-'100mg'$  daily Melatonin 3-6 mg at bedtime Vit D3 1000-2000 IU daily Aspirin 81 mg daily with food Optional: Famotidine '20mg'$  daily Also can add tylenol/ibuprofen as needed for fevers and body aches May add Mucinex or Mucinex DM as needed for cough/congestion  10 Things You Can Do to Manage Your COVID-19 Symptoms at  Home If you have possible or confirmed COVID-19 Stay home except to get medical care. Monitor your symptoms carefully. If your symptoms get worse, call your healthcare provider immediately. Get rest and stay hydrated. If you have a medical appointment, call the healthcare provider ahead of time and tell them that you have or may have COVID-19. For medical emergencies, call 911 and notify the dispatch personnel that you have or may have COVID-19. Cover your cough and sneezes with a tissue or use the inside of your elbow. Wash your hands often with soap and water for at least 20 seconds or clean your hands with an alcohol-based hand sanitizer that contains at least 60% alcohol. As much as possible, stay in a specific room and away from other people in your home. Also, you should use a separate bathroom, if available. If you need to be around other people in or outside of the home, wear a mask. Avoid sharing personal items with other people in your household, like dishes, towels, and bedding. Clean all surfaces that are touched often, like counters, tabletops, and doorknobs. Use household cleaning sprays or wipes according to the label instructions. June 02/23/2020 This information is not intended to replace advice given to you by your health care provider. Make sure you discuss any questions you have with your healthcare provider. Document Revised: 09/13/2020 Document Reviewed: 09/13/2020 Elsevier Patient Education  [place].

## 2021-03-31 ENCOUNTER — Ambulatory Visit: Payer: BC Managed Care – PPO

## 2021-03-31 ENCOUNTER — Telehealth (INDEPENDENT_AMBULATORY_CARE_PROVIDER_SITE_OTHER): Payer: Self-pay

## 2021-03-31 ENCOUNTER — Encounter (INDEPENDENT_AMBULATORY_CARE_PROVIDER_SITE_OTHER): Payer: Self-pay

## 2021-03-31 NOTE — Telephone Encounter (Signed)
Patient called stating that she needs a refill on viatmin D 1.25 , please give pt a call.

## 2021-03-31 NOTE — Telephone Encounter (Signed)
Pt advised that Vitamin D is refilled at office visits per MD policy.

## 2021-04-01 ENCOUNTER — Ambulatory Visit (INDEPENDENT_AMBULATORY_CARE_PROVIDER_SITE_OTHER): Payer: BC Managed Care – PPO | Admitting: Physician Assistant

## 2021-04-23 ENCOUNTER — Ambulatory Visit (INDEPENDENT_AMBULATORY_CARE_PROVIDER_SITE_OTHER): Payer: BC Managed Care – PPO | Admitting: Family Medicine

## 2021-05-05 ENCOUNTER — Encounter (INDEPENDENT_AMBULATORY_CARE_PROVIDER_SITE_OTHER): Payer: Self-pay | Admitting: Family Medicine

## 2021-05-05 ENCOUNTER — Ambulatory Visit (INDEPENDENT_AMBULATORY_CARE_PROVIDER_SITE_OTHER): Payer: BC Managed Care – PPO | Admitting: Family Medicine

## 2021-05-05 ENCOUNTER — Other Ambulatory Visit: Payer: Self-pay

## 2021-05-05 VITALS — BP 126/76 | HR 85 | Temp 98.0°F | Ht 66.0 in | Wt 175.0 lb

## 2021-05-05 DIAGNOSIS — E559 Vitamin D deficiency, unspecified: Secondary | ICD-10-CM | POA: Diagnosis not present

## 2021-05-05 DIAGNOSIS — Z6836 Body mass index (BMI) 36.0-36.9, adult: Secondary | ICD-10-CM | POA: Diagnosis not present

## 2021-05-05 DIAGNOSIS — Z9189 Other specified personal risk factors, not elsewhere classified: Secondary | ICD-10-CM | POA: Diagnosis not present

## 2021-05-05 MED ORDER — VITAMIN D (ERGOCALCIFEROL) 1.25 MG (50000 UNIT) PO CAPS: 50000.0000 [IU] | ORAL_CAPSULE | ORAL | 0 refills | Status: DC

## 2021-05-06 NOTE — Progress Notes (Signed)
Chief Complaint:   OBESITY Theresa Johnson is here to discuss her progress with her obesity treatment plan along with follow-up of her obesity related diagnoses. Theresa Johnson is on the Category 3 Plan and keeping a food journal and adhering to recommended goals of 600-700 calories and 45+ grams of protein at supper daily and states she is following her eating plan approximately 90+% of the time. Theresa Johnson states she is doing pilates for 50 minutes 7 times per week.  Today's visit was #: 11 Starting weight: 227 lbs Starting date: 10/03/2020 Today's weight: 175 lbs Today's date: 05/05/2021 Total lbs lost to date: 70 Total lbs lost since last in-office visit: 13  Interim History: Theresa Johnson is using Lose It and she notes it helps a lot because she is thinking about what she  eats and when etc. She also has increased intensity of her workout as well and working on core strengthening now. She had to buy all new clothes.  Subjective:   1. Vitamin D deficiency Theresa Johnson is currently taking prescription vitamin D 50,000 IU each week. She denies nausea, vomiting or muscle weakness.  2. At risk for dehydration Theresa Johnson is at risk for dehydration due to increased intensity of workout.  Assessment/Plan:  No orders of the defined types were placed in this encounter.   Medications Discontinued During This Encounter  Medication Reason   Vitamin D, Ergocalciferol, (DRISDOL) 1.25 MG (50000 UNIT) CAPS capsule Reorder     Meds ordered this encounter  Medications   Vitamin D, Ergocalciferol, (DRISDOL) 1.25 MG (50000 UNIT) CAPS capsule    Sig: Take 1 capsule (50,000 Units total) by mouth every 7 (seven) days.    Dispense:  4 capsule    Refill:  0     1. Vitamin D deficiency Low Vitamin D level contributes to fatigue and are associated with obesity, breast, and colon cancer. We will refill prescription Vitamin D for 1 month. Theresa Johnson will follow-up for routine testing of Vitamin D, at least 2-3 times per year to  avoid over-replacement.  - Vitamin D, Ergocalciferol, (DRISDOL) 1.25 MG (50000 UNIT) CAPS capsule; Take 1 capsule (50,000 Units total) by mouth every 7 (seven) days.  Dispense: 4 capsule; Refill: 0  2. At risk for dehydration Theresa Johnson was given approximately 9 minutes dehydration prevention counseling today. Theresa Johnson is at risk for dehydration due to weight loss and current medication(s). She was encouraged to hydrate and monitor fluid status to avoid dehydration as well as weight loss plateaus.   3. Obesity with current BMI of 28.3 Theresa Johnson is currently in the action stage of change. As such, her goal is to continue with weight loss efforts. She has agreed to the Category 3 Plan or keeping a food journal and adhering to recommended goals of 1500-1600 calories and 120+ grams of protein daily.   Exercise goals: As is.  Behavioral modification strategies: planning for success and keeping a strict food journal.  Chante has agreed to follow-up with our clinic in 3 to 4 weeks. She was informed of the importance of frequent follow-up visits to maximize her success with intensive lifestyle modifications for her multiple health conditions.   Objective:   Blood pressure 126/76, pulse 85, temperature 98 F (36.7 C), height 5\' 6"  (1.676 m), weight 175 lb (79.4 kg), SpO2 100 %. Body mass index is 28.25 kg/m.  General: Cooperative, alert, well developed, in no acute distress. HEENT: Conjunctivae and lids unremarkable. Cardiovascular: Regular rhythm.  Lungs: Normal work of breathing. Neurologic: No focal  deficits.   Lab Results  Component Value Date   CREATININE 0.83 10/03/2020   BUN 9 10/03/2020   NA 139 10/03/2020   K 4.5 10/03/2020   CL 104 10/03/2020   CO2 23 10/03/2020   Lab Results  Component Value Date   ALT 27 10/03/2020   AST 33 10/03/2020   ALKPHOS 99 10/03/2020   BILITOT 0.6 10/03/2020   Lab Results  Component Value Date   HGBA1C 5.4 10/03/2020   Lab Results  Component  Value Date   INSULIN 6.1 02/04/2021   INSULIN 5.5 10/03/2020   Lab Results  Component Value Date   TSH 1.590 10/03/2020   Lab Results  Component Value Date   CHOL 199 02/04/2021   HDL 49 02/04/2021   LDLCALC 136 (H) 02/04/2021   TRIG 79 02/04/2021   CHOLHDL 4.1 02/04/2021   Lab Results  Component Value Date   VD25OH 58.4 02/04/2021   VD25OH 25.7 (L) 10/03/2020   VD25OH 23.35 (L) 07/03/2020   Lab Results  Component Value Date   WBC 6.7 10/03/2020   HGB 14.0 10/03/2020   HCT 41.8 10/03/2020   MCV 100 (H) 10/03/2020   PLT 158 10/03/2020   Lab Results  Component Value Date   FERRITIN 234.7 07/03/2020   Attestation Statements:   Reviewed by clinician on day of visit: allergies, medications, problem list, medical history, surgical history, family history, social history, and previous encounter notes.   [name], am acting as transcriptionist for [employer], DO.  I have reviewed the above documentation for accuracy and completeness, and I agree with the above. Marjory Sneddon, D.O.  The [place] was signed into law in 2016 which includes the topic of electronic health records.  This provides immediate access to information in MyChart.  This includes consultation notes, operative notes, office notes, lab results and pathology reports.  If you have any questions about what you read please let us know at your next visit so we can discuss your concerns and take corrective action if need be.  We are right here with you.

## 2021-05-14 ENCOUNTER — Ambulatory Visit
Admission: RE | Admit: 2021-05-14 | Discharge: 2021-05-14 | Disposition: A | Discharge status: Home / Self Care | Payer: BC Managed Care – PPO | Source: Ambulatory Visit | Attending: Obstetrics and Gynecology | Admitting: Obstetrics and Gynecology

## 2021-05-14 ENCOUNTER — Other Ambulatory Visit: Payer: Self-pay

## 2021-05-14 DIAGNOSIS — Z1231 Encounter for screening mammogram for malignant neoplasm of breast: Secondary | ICD-10-CM

## 2021-05-16 ENCOUNTER — Other Ambulatory Visit: Payer: Self-pay | Admitting: Radiology

## 2021-05-19 ENCOUNTER — Ambulatory Visit (HOSPITAL_COMMUNITY)
Admission: RE | Admit: 2021-05-19 | Discharge: 2021-05-19 | Disposition: A | Discharge status: Home / Self Care | Payer: BC Managed Care – PPO | Source: Ambulatory Visit | Attending: Diagnostic Radiology | Admitting: Diagnostic Radiology

## 2021-05-19 ENCOUNTER — Encounter (HOSPITAL_COMMUNITY): Payer: Self-pay | Admitting: General Practice

## 2021-05-19 ENCOUNTER — Other Ambulatory Visit: Payer: Self-pay

## 2021-05-19 ENCOUNTER — Observation Stay (HOSPITAL_COMMUNITY)
Admission: RE | Admit: 2021-05-19 | Discharge: 2021-05-20 | Disposition: A | Discharge status: Home / Self Care | Payer: BC Managed Care – PPO | Source: Ambulatory Visit | Attending: Diagnostic Radiology | Admitting: Diagnostic Radiology

## 2021-05-19 DIAGNOSIS — Z79899 Other long term (current) drug therapy: Secondary | ICD-10-CM | POA: Diagnosis not present

## 2021-05-19 DIAGNOSIS — Z23 Encounter for immunization: Secondary | ICD-10-CM | POA: Insufficient documentation

## 2021-05-19 DIAGNOSIS — D259 Leiomyoma of uterus, unspecified: Secondary | ICD-10-CM | POA: Diagnosis not present

## 2021-05-19 DIAGNOSIS — N920 Excessive and frequent menstruation with regular cycle: Secondary | ICD-10-CM | POA: Diagnosis not present

## 2021-05-19 HISTORY — PX: IR US GUIDE VASC ACCESS RIGHT: IMG2390

## 2021-05-19 HISTORY — PX: IR EMBO TUMOR ORGAN ISCHEMIA INFARCT INC GUIDE ROADMAPPING: IMG5449

## 2021-05-19 HISTORY — PX: IR ANGIOGRAM PELVIS SELECTIVE OR SUPRASELECTIVE: IMG661

## 2021-05-19 HISTORY — PX: IR ANGIOGRAM SELECTIVE EACH ADDITIONAL VESSEL: IMG667

## 2021-05-19 LAB — BASIC METABOLIC PANEL
Anion gap: 5 (ref 5–15)
BUN: 17 mg/dL (ref 6–20)
CO2: 23 mmol/L (ref 22–32)
Calcium: 8.7 mg/dL — ABNORMAL LOW (ref 8.9–10.3)
Chloride: 111 mmol/L (ref 98–111)
Creatinine, Ser: 0.74 mg/dL (ref 0.44–1.00)
GFR, Estimated: >60 mL/min (ref >60)
Glucose, Bld: 95 mg/dL (ref 70–99)
Potassium: 3.6 mmol/L (ref 3.5–5.1)
Sodium: 139 mmol/L (ref 135–145)

## 2021-05-19 LAB — CBC WITH DIFFERENTIAL/PLATELET
Abs Immature Granulocytes: 0.03 10*3/uL (ref 0.00–0.07)
Basophils Absolute: 0 10*3/uL (ref 0.0–0.1)
Basophils Relative: 0 %
Eosinophils Absolute: 0.2 10*3/uL (ref 0.0–0.5)
Eosinophils Relative: 2 %
HCT: 40.1 % (ref 36.0–46.0)
Hemoglobin: 13.4 g/dL (ref 12.0–15.0)
Immature Granulocytes: 0 %
Lymphocytes Relative: 28 %
Lymphs Abs: 2.3 10*3/uL (ref 0.7–4.0)
MCH: 33.8 pg (ref 26.0–34.0)
MCHC: 33.4 g/dL (ref 30.0–36.0)
MCV: 101 fL — ABNORMAL HIGH (ref 80.0–100.0)
Monocytes Absolute: 0.7 10*3/uL (ref 0.1–1.0)
Monocytes Relative: 9 %
Neutro Abs: 5.1 10*3/uL (ref 1.7–7.7)
Neutrophils Relative %: 61 %
Platelets: 244 10*3/uL (ref 150–400)
RBC: 3.97 MIL/uL (ref 3.87–5.11)
RDW: 13.4 % (ref 11.5–15.5)
WBC: 8.4 10*3/uL (ref 4.0–10.5)
nRBC: 0 % (ref 0.0–0.2)

## 2021-05-19 LAB — HCG, SERUM, QUALITATIVE: Preg, Serum: NEGATIVE

## 2021-05-19 LAB — PROTIME-INR
INR: 1 (ref 0.8–1.2)
Prothrombin Time: 13.5 seconds (ref 11.4–15.2)

## 2021-05-19 MED ORDER — SODIUM CHLORIDE 0.9% FLUSH: 9.0000 mL | INTRAVENOUS | Status: DC | PRN

## 2021-05-19 MED ORDER — CEFAZOLIN SODIUM-DEXTROSE 2-4 GM/100ML-% IV SOLN: 2.0000 g | INTRAVENOUS | Status: AC

## 2021-05-19 MED ORDER — PROCHLORPERAZINE EDISYLATE 10 MG/2ML IJ SOLN
10.0000 mg | Freq: Four times a day (QID) | INTRAMUSCULAR | Status: DC | PRN
  Administered 2021-05-19: 10 mg via INTRAVENOUS
  Filled 2021-05-19: qty 2

## 2021-05-19 MED ORDER — CEFAZOLIN SODIUM-DEXTROSE 2-4 GM/100ML-% IV SOLN
INTRAVENOUS | Status: AC
  Administered 2021-05-19: 2 g via INTRAVENOUS
  Filled 2021-05-19: qty 100

## 2021-05-19 MED ORDER — KETOROLAC TROMETHAMINE 30 MG/ML IJ SOLN
INTRAMUSCULAR | Status: AC
  Administered 2021-05-19: 30 mg via INTRAVENOUS
  Filled 2021-05-19: qty 1

## 2021-05-19 MED ORDER — MIDAZOLAM HCL 2 MG/2ML IJ SOLN
INTRAMUSCULAR | Status: DC | PRN
  Administered 2021-05-19: 1 mg via INTRAVENOUS

## 2021-05-19 MED ORDER — NALOXONE HCL 0.4 MG/ML IJ SOLN: 0.4000 mg | INTRAMUSCULAR | Status: DC | PRN

## 2021-05-19 MED ORDER — LIDOCAINE HCL (PF) 1 % IJ SOLN
INTRAMUSCULAR | Status: DC | PRN
  Administered 2021-05-19: 10 mL via INTRADERMAL

## 2021-05-19 MED ORDER — FENTANYL CITRATE (PF) 100 MCG/2ML IJ SOLN
INTRAMUSCULAR | Status: DC | PRN
  Administered 2021-05-19: 50 ug via INTRAVENOUS

## 2021-05-19 MED ORDER — DIPHENHYDRAMINE HCL 50 MG/ML IJ SOLN: 12.5000 mg | Freq: Four times a day (QID) | INTRAMUSCULAR | Status: DC | PRN

## 2021-05-19 MED ORDER — KETOROLAC TROMETHAMINE 30 MG/ML IJ SOLN
30.0000 mg | Freq: Four times a day (QID) | INTRAMUSCULAR | Status: DC
  Administered 2021-05-19 – 2021-05-20 (×3): 30 mg via INTRAVENOUS
  Filled 2021-05-19 (×3): qty 1

## 2021-05-19 MED ORDER — PROMETHAZINE HCL 25 MG PO TABS
25.0000 mg | ORAL_TABLET | Freq: Three times a day (TID) | ORAL | Status: DC | PRN
  Administered 2021-05-19: 25 mg via ORAL
  Filled 2021-05-19: qty 1

## 2021-05-19 MED ORDER — PROMETHAZINE HCL 25 MG RE SUPP: 25.0000 mg | Freq: Three times a day (TID) | RECTAL | Status: DC | PRN

## 2021-05-19 MED ORDER — ONDANSETRON HCL 4 MG/2ML IJ SOLN: 4.0000 mg | Freq: Four times a day (QID) | INTRAMUSCULAR | Status: DC | PRN

## 2021-05-19 MED ORDER — HYDROMORPHONE 1 MG/ML IV SOLN
INTRAVENOUS | Status: DC
Start: 2021-05-19 — End: 2021-05-19
  Administered 2021-05-19: 30 mg via INTRAVENOUS
  Administered 2021-05-19: 2 mg via INTRAVENOUS
  Filled 2021-05-19 (×12): qty 30

## 2021-05-19 MED ORDER — SODIUM CHLORIDE 0.9 % IV SOLN: 250.0000 mL | INTRAVENOUS | Status: DC | PRN

## 2021-05-19 MED ORDER — SODIUM CHLORIDE 0.9 % IV SOLN: INTRAVENOUS | Status: DC

## 2021-05-19 MED ORDER — DOCUSATE SODIUM 100 MG PO CAPS
100.0000 mg | ORAL_CAPSULE | Freq: Two times a day (BID) | ORAL | Status: DC
  Administered 2021-05-19 – 2021-05-20 (×2): 100 mg via ORAL
  Filled 2021-05-19 (×2): qty 1

## 2021-05-19 MED ORDER — SODIUM CHLORIDE 0.9% FLUSH: 3.0000 mL | INTRAVENOUS | Status: DC | PRN

## 2021-05-19 MED ORDER — SODIUM CHLORIDE 0.9% FLUSH
3.0000 mL | Freq: Two times a day (BID) | INTRAVENOUS | Status: DC
  Administered 2021-05-19: 3 mL via INTRAVENOUS

## 2021-05-19 MED ORDER — SODIUM CHLORIDE 0.9 % IV SOLN
25.0000 mg | Freq: Four times a day (QID) | INTRAVENOUS | Status: DC | PRN
  Filled 2021-05-19: qty 1

## 2021-05-19 MED ORDER — LIDOCAINE HCL 1 % IJ SOLN
INTRAMUSCULAR | Status: AC
  Filled 2021-05-19: qty 20

## 2021-05-19 MED ORDER — DIPHENHYDRAMINE HCL 12.5 MG/5ML PO ELIX
12.5000 mg | ORAL_SOLUTION | Freq: Four times a day (QID) | ORAL | Status: DC | PRN
  Filled 2021-05-19: qty 5

## 2021-05-19 MED ORDER — FENTANYL CITRATE (PF) 100 MCG/2ML IJ SOLN
INTRAMUSCULAR | Status: AC
  Filled 2021-05-19: qty 4

## 2021-05-19 MED ORDER — ONDANSETRON HCL 4 MG/2ML IJ SOLN
4.0000 mg | Freq: Four times a day (QID) | INTRAMUSCULAR | Status: DC | PRN
  Administered 2021-05-19 (×2): 4 mg via INTRAVENOUS
  Filled 2021-05-19 (×2): qty 2

## 2021-05-19 MED ORDER — MIDAZOLAM HCL 2 MG/2ML IJ SOLN
INTRAMUSCULAR | Status: DC | PRN
  Administered 2021-05-19: .5 mg via INTRAVENOUS

## 2021-05-19 MED ORDER — HYDROMORPHONE 1 MG/ML IV SOLN
INTRAVENOUS | Status: DC
  Administered 2021-05-19: 1.5 mg via INTRAVENOUS
  Administered 2021-05-20: 0.9 mg via INTRAVENOUS
  Administered 2021-05-20: 0.3 mg via INTRAVENOUS

## 2021-05-19 MED ORDER — MIDAZOLAM HCL 2 MG/2ML IJ SOLN
INTRAMUSCULAR | Status: AC
  Filled 2021-05-19: qty 6

## 2021-05-19 NOTE — Progress Notes (Addendum)
Patient ID: Theresa Johnson, female   DOB: 05/26/1971, 50 y.o.   MRN: [id number] Pt s/p bilat uterine artery embolization earlier today; c/o pelvic cramping as expected, intermittent nausea- likely from PCA dilaudud; has had small amount liquid to drink; afebrile; BP ok; rt CFA site ok, NT, no hematoma; 1+ distal pulses; for overnight obs; will add IV phenergan/compazine to med list

## 2021-05-19 NOTE — Progress Notes (Signed)
Report called to [facility] on 1600. Called patient's husband to inform him that pt has room now.

## 2021-05-19 NOTE — H&P (Signed)
Referring Physician(s): Cousins,S  Supervising Physician: Markus Daft  Patient Status:  [facility] OP TBA  Chief Complaint:  Symptomatic uterine fibroids  Subjective: Patient familiar to IR service from consultation with Dr. Anselm Pancoast on 02/13/2021 to discuss treatment options for symptomatic uterine fibroids. She was deemed an appropriate candidate for bilateral uterine artery embolization and presents today for the procedure.  She currently denies fever, headache, chest pain, dyspnea, cough, back pain, nausea, vomiting.  She does have menorrhagia, mild pelvic discomfort, some mild left lower extremity edema. She is currently on her period.  Past Medical History:  Diagnosis Date   Anxiety    Back pain    Joint pain    Lactose intolerance    Lymphedema    SOBOE (shortness of breath on exertion)    Swelling of both lower extremities    Vitamin D deficiency    Past Surgical History:  Procedure Laterality Date   BREAST BIOPSY     IR RADIOLOGIST EVAL & MGMT  02/13/2021       Allergies: Lactose intolerance (gi)  Medications: Prior to Admission medications   Medication Sig Start Date End Date Taking? Authorizing Provider  Vitamin D, Ergocalciferol, (DRISDOL) 1.25 MG (50000 UNIT) CAPS capsule Take 1 capsule (50,000 Units total) by mouth every 7 (seven) days. 05/05/21  Yes Opalski, Neoma Laming, DO  benzonatate (TESSALON) 100 MG capsule Take 1 capsule (100 mg total) by mouth 3 (three) times daily as needed. 03/27/21   Mar Daring, PA-C     Vital Signs: BP 124/81   Pulse 69   Temp 99.1 F (37.3 C) (Oral)   Resp 19   Ht 5\' 6"  (1.676 m)   Wt 177 lb (80.3 kg)   LMP 05/15/2021 (Exact Date)   SpO2 100%   BMI 28.57 kg/m   Physical Exam awake, alert.  Chest clear to auscultation bilaterally.  Heart with regular rate and rhythm.  Abdomen soft, positive bowel sounds, fibroid uterus, mild pelvic tenderness to palpation.  Some minimal left lower extremity edema.  Imaging: No results  found.  Labs:  CBC: Recent Labs    07/03/20 0953 10/03/20 1213 05/19/21 0802  WBC 8.8 6.7 8.4  HGB 13.5 14.0 13.4  HCT 40.6 41.8 40.1  PLT 254.0 158 244    COAGS: Recent Labs    05/19/21 0802  INR 1.0    BMP: Recent Labs    07/03/20 0953 10/03/20 1213  NA 137 139  K 3.6 4.5  CL 102 104  CO2 29 23  GLUCOSE 92 86  BUN 12 9  CALCIUM 9.0 9.2  CREATININE 0.85 0.83  GFRNONAA  --  82  GFRAA  --  94    LIVER FUNCTION TESTS: Recent Labs    07/03/20 0953 10/03/20 1213  BILITOT 0.8 0.6  AST 23 33  ALT 23 27  ALKPHOS 76 99  PROT 7.2 7.3  ALBUMIN 3.9 4.3    Assessment and Plan: Patient familiar to IR service from consultation with Dr. Anselm Pancoast on 02/13/2021 to discuss treatment options for symptomatic uterine fibroids. She was deemed an appropriate candidate for bilateral uterine artery embolization and presents today for the procedure. Risks and benefits of procedure were discussed with the patient including, but not limited to bleeding, infection, vascular injury or contrast induced renal failure.  This interventional procedure involves the use of X-rays and because of the nature of the planned procedure, it is possible that we will have prolonged use of X-ray fluoroscopy.  Potential radiation  risks to you include (but are not limited to) the following: - A slightly elevated risk for cancer  several years later in life. This risk is typically less than 0.5% percent. This risk is low in comparison to the normal incidence of human cancer, which is 33% for women and 50% for men according to the [place]. - Radiation induced injury can include skin redness, resembling a rash, tissue breakdown / ulcers and hair loss (which can be temporary or permanent).   The likelihood of either of these occurring depends on the difficulty of the procedure and whether you are sensitive to radiation due to previous procedures, disease, or genetic conditions.   IF your  procedure requires a prolonged use of radiation, you will be notified and given written instructions for further action.  It is your responsibility to monitor the irradiated area for the 2 weeks following the procedure and to notify your physician if you are concerned that you have suffered a radiation induced injury.    All of the patient's questions were answered, patient is agreeable to proceed.  Consent signed and in chart.  Postprocedural patient will be admitted to the hospital for overnight observation for pain control.    Electronically Signed: D. Rowe Robert, PA-C 05/19/2021, 9:00 AM   I spent a total of 30 minutes at the the patient's bedside AND on the patient's hospital floor or unit, greater than 50% of which was counseling/coordinating care for bilateral uterine artery embolization

## 2021-05-19 NOTE — Procedures (Signed)
Interventional Radiology Procedure:   Indications: Menorrhagia with fibroids  Procedure: Bilateral uterine artery embolization  Findings: Successful embolization of bilateral uterine arteries  Complications: No immediate complications noted.     EBL: Minimal  Plan: Bedrest 4 hours and overnight observation.     Eulises Kijowski R. Anselm Pancoast, MD  Pager: [phone]

## 2021-05-20 DIAGNOSIS — Z79899 Other long term (current) drug therapy: Secondary | ICD-10-CM | POA: Diagnosis not present

## 2021-05-20 DIAGNOSIS — D259 Leiomyoma of uterus, unspecified: Secondary | ICD-10-CM | POA: Diagnosis not present

## 2021-05-20 DIAGNOSIS — N920 Excessive and frequent menstruation with regular cycle: Secondary | ICD-10-CM | POA: Diagnosis not present

## 2021-05-20 DIAGNOSIS — Z23 Encounter for immunization: Secondary | ICD-10-CM | POA: Diagnosis not present

## 2021-05-20 MED ORDER — HYDROCODONE-ACETAMINOPHEN 5-325 MG PO TABS: 1.0000 | ORAL_TABLET | Freq: Four times a day (QID) | ORAL | 0 refills | Status: DC | PRN

## 2021-05-20 MED ORDER — PROMETHAZINE HCL 25 MG PO TABS: 25.0000 mg | ORAL_TABLET | Freq: Three times a day (TID) | ORAL | 0 refills | Status: DC | PRN

## 2021-05-20 MED ORDER — HYDROCODONE-ACETAMINOPHEN 5-325 MG PO TABS
1.0000 | ORAL_TABLET | Freq: Four times a day (QID) | ORAL | Status: DC | PRN
  Administered 2021-05-20: 1 via ORAL
  Filled 2021-05-20: qty 1

## 2021-05-20 MED ORDER — INFLUENZA VAC SPLIT QUAD 0.5 ML IM SUSY
0.5000 mL | PREFILLED_SYRINGE | INTRAMUSCULAR | Status: AC
  Administered 2021-05-20: 0.5 mL via INTRAMUSCULAR
  Filled 2021-05-20: qty 0.5

## 2021-05-20 MED ORDER — DOCUSATE SODIUM 100 MG PO CAPS: 100.0000 mg | ORAL_CAPSULE | Freq: Two times a day (BID) | ORAL | 0 refills | Status: DC

## 2021-05-20 NOTE — Progress Notes (Signed)
Pt discharged home with spouse in stable condition. Discharge instructions given. Scripts sent to pharmacy of choice. No immediate questions or concerns at this time. Pt discharged from unit via wheelchair.  

## 2021-05-20 NOTE — Discharge Summary (Signed)
Patient ID: Theresa Johnson MRN: [id number] DOB/AGE: [birth year] 50 y.o.  Admit date: 05/19/2021 Discharge date: 05/20/2021  Supervising Physician: Theresa Johnson  Patient Status: [facility] - In-pt  Admission Diagnoses: Symptomatic uterine fibroids  Discharge Diagnoses: Symptomatic uterine fibroids status post successful bilateral uterine artery embolization on 05/19/2021 Active Problems:   Menorrhagia Past Medical History:  Diagnosis Date   Anxiety    Back pain    Joint pain    Lactose intolerance    Lymphedema    SOBOE (shortness of breath on exertion)    Swelling of both lower extremities    Vitamin D deficiency    Past Surgical History:  Procedure Laterality Date   BREAST BIOPSY     IR ANGIOGRAM PELVIS SELECTIVE OR SUPRASELECTIVE  05/19/2021   IR ANGIOGRAM PELVIS SELECTIVE OR SUPRASELECTIVE  05/19/2021   IR ANGIOGRAM SELECTIVE EACH ADDITIONAL VESSEL  05/19/2021   IR ANGIOGRAM SELECTIVE EACH ADDITIONAL VESSEL  05/19/2021   IR EMBO TUMOR ORGAN ISCHEMIA INFARCT INC GUIDE ROADMAPPING  05/19/2021   IR RADIOLOGIST EVAL & MGMT  02/13/2021   IR US GUIDE VASC ACCESS RIGHT  05/19/2021     Discharged Condition: good  Hospital Course: Theresa Johnson/Theresa Johnson is a 50 year old female with history of symptomatic uterine fibroids who underwent consultation with Dr. Anselm Johnson on 02/13/2021 to discuss treatment options.  Following these discussions she was deemed an appropriate candidate for bilateral uterine artery embolization and she presented to [facility] for the procedure on 05/19/2021.  Procedure was performed without immediate complications and she was admitted for overnight observation for pain control.  She was placed on Dilaudid PCA pump for pain control.  Over the course of her stay the patient did have some expected pelvic cramping along with intermittent nausea and vomiting.  She was given antiemetics with relief.  On the morning of discharge she was stable.  She was able to void,  ambulate and tolerate her diet without difficulty.  She denied any additional nausea or vomiting.  Her only complaint was minimal pelvic cramping.  Electronic prescriptions were sent to patient's pharmacy for [place], Colace and Phenergan.  She will continue her current home medications.  She will be scheduled for follow-up with Dr. Anselm Johnson in the IR clinic in 3 to 4 weeks.  She will continue gynecologic care with Dr. Garwin Johnson as scheduled.  She was told to contact our service with any additional questions or concerns.  Consults: None  Significant Diagnostic Studies:  Results for orders placed or performed during the hospital encounter of 05/19/21  CBC with Differential/Platelet  Result Value Ref Range   WBC 8.4 4.0 - 10.5 K/uL   RBC 3.97 3.87 - 5.11 MIL/uL   Hemoglobin 13.4 12.0 - 15.0 g/dL   HCT 40.1 36.0 - 46.0 %   MCV 101.0 (H) 80.0 - 100.0 fL   MCH 33.8 26.0 - 34.0 pg   MCHC 33.4 30.0 - 36.0 g/dL   RDW 13.4 11.5 - 15.5 %   Platelets 244 150 - 400 K/uL   nRBC 0.0 0.0 - 0.2 %   Neutrophils Relative % 61 %   Neutro Abs 5.1 1.7 - 7.7 K/uL   Lymphocytes Relative 28 %   Lymphs Abs 2.3 0.7 - 4.0 K/uL   Monocytes Relative 9 %   Monocytes Absolute 0.7 0.1 - 1.0 K/uL   Eosinophils Relative 2 %   Eosinophils Absolute 0.2 0.0 - 0.5 K/uL   Basophils Relative 0 %   Basophils Absolute 0.0 0.0 -  0.1 K/uL   Immature Granulocytes 0 %   Abs Immature Granulocytes 0.03 0.00 - 0.07 K/uL  Protime-INR  Result Value Ref Range   Prothrombin Time 13.5 11.4 - 15.2 seconds   INR 1.0 0.8 - 1.2  hCG, serum, qualitative  Result Value Ref Range   Preg, Serum NEGATIVE NEGATIVE  Basic metabolic panel  Result Value Ref Range   Sodium 139 135 - 145 mmol/L   Potassium 3.6 3.5 - 5.1 mmol/L   Chloride 111 98 - 111 mmol/L   CO2 23 22 - 32 mmol/L   Glucose, Bld 95 70 - 99 mg/dL   BUN 17 6 - 20 mg/dL   Creatinine, Ser 0.74 0.44 - 1.00 mg/dL   Calcium 8.7 (L) 8.9 - 10.3 mg/dL   GFR, Estimated >60 >60 mL/min    Anion gap 5 5 - 15     Treatments: Successful bilateral uterine artery embolization on 05/19/2021.  Discharge Exam: Blood pressure 136/73, pulse 84, temperature 99.1 F (37.3 C), temperature source Oral, resp. rate (!) 21, height 5\' 6"  (1.676 m), weight 177 lb (80.3 kg), last menstrual period 05/15/2021, SpO2 97 %. Patient awake, alert.  Chest clear to auscultation bilaterally.  Heart with regular rate and rhythm.  Abdomen soft, positive bowel sounds, mildly tender anterior pelvic region to palpation, fibroid uterus.  Puncture site right common femoral artery soft, clean, dry, nontender, no hematoma.  Intact distal pulses, no lower extremity edema  Disposition: Discharge disposition: 01-Home or Self Care       Discharge Instructions     Call MD for:  difficulty breathing, headache or visual disturbances   Complete by: As directed    Call MD for:  extreme fatigue   Complete by: As directed    Call MD for:  hives   Complete by: As directed    Call MD for:  persistant dizziness or light-headedness   Complete by: As directed    Call MD for:  persistant nausea and vomiting   Complete by: As directed    Call MD for:  redness, tenderness, or signs of infection (pain, swelling, redness, odor or green/yellow discharge around incision site)   Complete by: As directed    Call MD for:  severe uncontrolled pain   Complete by: As directed    Call MD for:  temperature >100.4   Complete by: As directed    Change dressing (specify)   Complete by: As directed    May remove bandage over right groin and apply new Band-Aid to site daily for the next 2 to 3 days.  May wash site with soap and water.   Diet - low sodium heart healthy   Complete by: As directed    Discharge instructions   Complete by: As directed    Please stay well-hydrated, may resume home medications; take Aleve or Advil as first choice for pain control; avoid driving after taking narcotics   Driving Restrictions   Complete by:  As directed    No driving for the next 24 hours or after taking narcotics   Increase activity slowly   Complete by: As directed    Lifting restrictions   Complete by: As directed    No heavy lifting for the next 3 to 4 days   May shower / Bathe   Complete by: As directed    May walk up steps   Complete by: As directed    Sexual Activity Restrictions   Complete by: As directed  No sexual intercourse for 1 week      Allergies as of 05/20/2021       Reactions   Lactose Intolerance (gi)         Medication List     TAKE these medications    benzonatate 100 MG capsule Commonly known as: TESSALON Take 1 capsule (100 mg total) by mouth 3 (three) times daily as needed.   docusate sodium 100 MG capsule Commonly known as: COLACE Take 1 capsule (100 mg total) by mouth 2 (two) times daily.   HYDROcodone-acetaminophen 5-325 MG tablet Commonly known as: NORCO/VICODIN Take 1-2 tablets by mouth every 6 (six) hours as needed for moderate pain.   promethazine 25 MG tablet Commonly known as: PHENERGAN Take 1 tablet (25 mg total) by mouth every 8 (eight) hours as needed for nausea.   Vitamin D (Ergocalciferol) 1.25 MG (50000 UNIT) Caps capsule Commonly known as: DRISDOL Take 1 capsule (50,000 Units total) by mouth every 7 (seven) days.               Discharge Care Instructions  (From admission, onward)           Start     Ordered   05/20/21 0000  Change dressing (specify)       Comments: May remove bandage over right groin and apply new Band-Aid to site daily for the next 2 to 3 days.  May wash site with soap and water.   05/20/21 1041            Follow-up Information     Theresa Daft, MD Follow up.   Specialties: Interventional Radiology, Radiology Why: Radiology will call you with follow-up with Dr. Halford Decamp in the IR clinic in 3 to 4 weeks.  Please call [phone] or [phone] with any additional questions Contact information: [address] [place] [address] [phone]         Servando Salina, MD Follow up.   Specialty: Obstetrics and Gynecology Why: Continue follow-up with Dr. Garwin Johnson as scheduled Contact information: [place] [address] [phone]                  Electronically Signed: D. Rowe Robert, PA-C 05/20/2021, 10:44 AM   I have spent Less Than 30 Minutes discharging Theresa Johnson.

## 2021-05-20 NOTE — Discharge Instructions (Signed)
Please stay well-hydrated, avoid strenuous activity for the next 3 to 4 days, may resume home medications, please use Aleve or Advil as first choice for pain control with hydrocodone for pain not relieved with Aleve or Advil.  May use heating pad to anterior abdominal region as needed for cramping.

## 2021-05-21 ENCOUNTER — Telehealth: Payer: Self-pay | Admitting: Physician Assistant

## 2021-05-21 ENCOUNTER — Telehealth: Payer: Self-pay

## 2021-05-21 NOTE — Telephone Encounter (Signed)
Follow up call to patient this morning to discuss post procedure pain - patient reported to overnight IR attending pain with current regimen and was instructed to begin ibuprofen 2400 mg QD x 7 days.  Ms. Bertholf reports pain better controlled this morning, still present but mild. Having some cramping and nausea which is also mild currently. She had some soup today which she tolerated. She has not yet tried phenergan as pharmacy was out yesterday but plans on trying it later today. She is currently taking ibuprofen 400 mg Q4H and Norco 5/325 Q6H PRN for pain which she reports is working well. Discussed increasing ibuprofen to 800 mg Q8H for better pain control which patient states she will try if current regimen does not work. We reviewed expected post procedure symptoms including mild to moderate pain, cramping, nausea and constipation.  Patient denies any needs at this time, is aware of follow up wit Dr. Anselm Pancoast in 3-4 weeks and that IR scheduler will call to setup appointment date/time.  Encouraged continued PO intake, particularly pushing fluids for the next several days, as well as colace BID to avoid constipation and pain with BMs. Encouraged patient to call with any further concerns or if current medication regimen is ineffective for pain/nausea/vomiting. She understands and is in agreement with the above plan.   Candiss Norse, PA-C

## 2021-05-21 NOTE — Telephone Encounter (Signed)
Transition Care Management Unsuccessful Follow-up Telephone Call  Date of discharge and from where:  [facility] 05/19/21-05/20/21  Attempts:  1st Attempt  Reason for unsuccessful TCM follow-up call:  Missing or invalid number   Johnney Killian, RN, BSN, CCM Care Management Coordinator [facility] Internal Medicine Phone: [phone] / Fax: [phone]

## 2021-05-22 ENCOUNTER — Telehealth: Payer: Self-pay

## 2021-05-22 NOTE — Telephone Encounter (Signed)
Transition Care Management Unsuccessful Follow-up Telephone Call  Date of discharge and from where:  05/20/21 from [facility]  Attempts:  2nd Attempt  Reason for unsuccessful TCM follow-up call:  Left voice message  Thea Silversmith, RN, MSN, BSN, [place] Care Management Coordinator [phone]

## 2021-05-23 ENCOUNTER — Other Ambulatory Visit: Payer: Self-pay | Admitting: Obstetrics and Gynecology

## 2021-05-23 DIAGNOSIS — R928 Other abnormal and inconclusive findings on diagnostic imaging of breast: Secondary | ICD-10-CM

## 2021-05-26 ENCOUNTER — Other Ambulatory Visit (HOSPITAL_COMMUNITY): Payer: Self-pay | Admitting: Diagnostic Radiology

## 2021-05-27 ENCOUNTER — Other Ambulatory Visit: Payer: Self-pay

## 2021-05-27 ENCOUNTER — Ambulatory Visit
Admission: RE | Admit: 2021-05-27 | Discharge: 2021-05-27 | Disposition: A | Discharge status: Home / Self Care | Payer: BC Managed Care – PPO | Source: Ambulatory Visit | Attending: Obstetrics and Gynecology | Admitting: Obstetrics and Gynecology

## 2021-05-27 ENCOUNTER — Other Ambulatory Visit: Payer: Self-pay | Admitting: Obstetrics and Gynecology

## 2021-05-27 ENCOUNTER — Ambulatory Visit: Payer: BC Managed Care – PPO

## 2021-05-27 DIAGNOSIS — R928 Other abnormal and inconclusive findings on diagnostic imaging of breast: Secondary | ICD-10-CM | POA: Diagnosis not present

## 2021-05-27 DIAGNOSIS — R921 Mammographic calcification found on diagnostic imaging of breast: Secondary | ICD-10-CM

## 2021-05-27 DIAGNOSIS — R922 Inconclusive mammogram: Secondary | ICD-10-CM | POA: Diagnosis not present

## 2021-05-30 ENCOUNTER — Ambulatory Visit
Admission: RE | Admit: 2021-05-30 | Discharge: 2021-05-30 | Disposition: A | Discharge status: Home / Self Care | Payer: BC Managed Care – PPO | Source: Ambulatory Visit | Attending: Obstetrics and Gynecology | Admitting: Obstetrics and Gynecology

## 2021-05-30 ENCOUNTER — Other Ambulatory Visit: Payer: Self-pay

## 2021-05-30 DIAGNOSIS — R921 Mammographic calcification found on diagnostic imaging of breast: Secondary | ICD-10-CM

## 2021-05-30 DIAGNOSIS — N6011 Diffuse cystic mastopathy of right breast: Secondary | ICD-10-CM | POA: Diagnosis not present

## 2021-06-05 ENCOUNTER — Encounter (INDEPENDENT_AMBULATORY_CARE_PROVIDER_SITE_OTHER): Payer: Self-pay | Admitting: Family Medicine

## 2021-06-05 ENCOUNTER — Other Ambulatory Visit: Payer: Self-pay

## 2021-06-05 ENCOUNTER — Ambulatory Visit (INDEPENDENT_AMBULATORY_CARE_PROVIDER_SITE_OTHER): Payer: BC Managed Care – PPO | Admitting: Family Medicine

## 2021-06-05 VITALS — BP 134/82 | HR 61 | Temp 98.0°F | Ht 66.0 in | Wt 167.0 lb

## 2021-06-05 DIAGNOSIS — E559 Vitamin D deficiency, unspecified: Secondary | ICD-10-CM

## 2021-06-05 DIAGNOSIS — Z6836 Body mass index (BMI) 36.0-36.9, adult: Secondary | ICD-10-CM | POA: Diagnosis not present

## 2021-06-05 MED ORDER — VITAMIN D (ERGOCALCIFEROL) 1.25 MG (50000 UNIT) PO CAPS: 50000.0000 [IU] | ORAL_CAPSULE | ORAL | 0 refills | Status: DC

## 2021-06-09 NOTE — Progress Notes (Signed)
Chief Complaint:   OBESITY Theresa Johnson is here to discuss her progress with her obesity treatment plan along with follow-up of her obesity related diagnoses. Theresa Johnson is on the Category 3 Plan or keeping a food journal and adhering to recommended goals of 1500-1600 calories and 120+ grams of protein daily and states she is following her eating plan approximately 70% of the time. Theresa Johnson states she is doing pilates for 60 minutes 3 times per week.  Today's visit was #: 12 Starting weight: 227 lbs Starting date: 10/03/2020 Today's weight: 167 lbs Today's date: 06/05/2021 Total lbs lost to date: 60 Total lbs lost since last in-office visit: 8  Interim History: Ajna had a uterine ablation procedure for fibroids. She didn't eat for 1 week or so, she lost 5 lbs of fat mass since her last office visit 1 month ago.  Subjective:   1. Vitamin D deficiency Theresa Johnson is currently taking prescription vitamin D 50,000 IU each week. She denies nausea, vomiting or muscle weakness.  Assessment/Plan:  No orders of the defined types were placed in this encounter.   Medications Discontinued During This Encounter  Medication Reason   benzonatate (TESSALON) 100 MG capsule Error   docusate sodium (COLACE) 100 MG capsule Error   HYDROcodone-acetaminophen (NORCO/VICODIN) 5-325 MG tablet Error   promethazine (PHENERGAN) 25 MG tablet Error   Vitamin D, Ergocalciferol, (DRISDOL) 1.25 MG (50000 UNIT) CAPS capsule Reorder     Meds ordered this encounter  Medications   Vitamin D, Ergocalciferol, (DRISDOL) 1.25 MG (50000 UNIT) CAPS capsule    Sig: Take 1 capsule (50,000 Units total) by mouth every 7 (seven) days.    Dispense:  4 capsule    Refill:  0     1. Vitamin D deficiency Low Vitamin D level contributes to fatigue and are associated with obesity, breast, and colon cancer. We will refill prescription Vitamin D 50,000 IU every week for 1 month. Theresa Johnson will follow-up for routine testing of Vitamin  D, at least 2-3 times per year to avoid over-replacement.  - Vitamin D, Ergocalciferol, (DRISDOL) 1.25 MG (50000 UNIT) CAPS capsule; Take 1 capsule (50,000 Units total) by mouth every 7 (seven) days.  Dispense: 4 capsule; Refill: 0  2. Obesity with current BMI of 27.0 Theresa Johnson is currently in the action stage of change. As such, her goal is to maintain weight for now. She has agreed to the Category 3 Plan or keeping a food journal and adhering to recommended goals of 1500-1600 calories and 120+ grams of protein daily.   Theresa Johnson's goal is to maintain her weight at this time. She desires to be 170-175 lbs.  Exercise goals: As is.  Behavioral modification strategies: planning for success.  Theresa Johnson has agreed to follow-up with our clinic in 4 weeks. She was informed of the importance of frequent follow-up visits to maximize her success with intensive lifestyle modifications for her multiple health conditions.   Objective:   Blood pressure 134/82, pulse 61, temperature 98 F (36.7 C), height 5\' 6"  (1.676 m), weight 167 lb (75.8 kg), last menstrual period 05/15/2021, SpO2 99 %. Body mass index is 26.95 kg/m.  General: Cooperative, alert, well developed, in no acute distress. HEENT: Conjunctivae and lids unremarkable. Cardiovascular: Regular rhythm.  Lungs: Normal work of breathing. Neurologic: No focal deficits.   Lab Results  Component Value Date   CREATININE 0.74 05/19/2021   BUN 17 05/19/2021   NA 139 05/19/2021   K 3.6 05/19/2021   CL 111 05/19/2021  CO2 23 05/19/2021   Lab Results  Component Value Date   ALT 27 10/03/2020   AST 33 10/03/2020   ALKPHOS 99 10/03/2020   BILITOT 0.6 10/03/2020   Lab Results  Component Value Date   HGBA1C 5.4 10/03/2020   Lab Results  Component Value Date   INSULIN 6.1 02/04/2021   INSULIN 5.5 10/03/2020   Lab Results  Component Value Date   TSH 1.590 10/03/2020   Lab Results  Component Value Date   CHOL 199 02/04/2021   HDL 49  02/04/2021   LDLCALC 136 (H) 02/04/2021   TRIG 79 02/04/2021   CHOLHDL 4.1 02/04/2021   Lab Results  Component Value Date   VD25OH 58.4 02/04/2021   VD25OH 25.7 (L) 10/03/2020   VD25OH 23.35 (L) 07/03/2020   Lab Results  Component Value Date   WBC 8.4 05/19/2021   HGB 13.4 05/19/2021   HCT 40.1 05/19/2021   MCV 101.0 (H) 05/19/2021   PLT 244 05/19/2021   Lab Results  Component Value Date   FERRITIN 234.7 07/03/2020   Attestation Statements:   Reviewed by clinician on day of visit: allergies, medications, problem list, medical history, surgical history, family history, social history, and previous encounter notes.   [name], am acting as transcriptionist for [employer], DO.  I have reviewed the above documentation for accuracy and completeness, and I agree with the above. Marjory Sneddon, D.O.  The [place] was signed into law in 2016 which includes the topic of electronic health records.  This provides immediate access to information in MyChart.  This includes consultation notes, operative notes, office notes, lab results and pathology reports.  If you have any questions about what you read please let us know at your next visit so we can discuss your concerns and take corrective action if need be.  We are right here with you.

## 2021-06-28 ENCOUNTER — Other Ambulatory Visit (INDEPENDENT_AMBULATORY_CARE_PROVIDER_SITE_OTHER): Payer: Self-pay | Admitting: Family Medicine

## 2021-06-28 DIAGNOSIS — E559 Vitamin D deficiency, unspecified: Secondary | ICD-10-CM

## 2021-06-30 NOTE — Telephone Encounter (Signed)
Last OV with Dr Opalski 

## 2021-07-01 ENCOUNTER — Ambulatory Visit (INDEPENDENT_AMBULATORY_CARE_PROVIDER_SITE_OTHER): Payer: BC Managed Care – PPO | Admitting: Bariatrics

## 2021-07-07 ENCOUNTER — Other Ambulatory Visit (INDEPENDENT_AMBULATORY_CARE_PROVIDER_SITE_OTHER): Payer: Self-pay | Admitting: Family Medicine

## 2021-07-07 ENCOUNTER — Encounter (INDEPENDENT_AMBULATORY_CARE_PROVIDER_SITE_OTHER): Payer: Self-pay

## 2021-07-07 DIAGNOSIS — E559 Vitamin D deficiency, unspecified: Secondary | ICD-10-CM

## 2021-07-12 ENCOUNTER — Other Ambulatory Visit (INDEPENDENT_AMBULATORY_CARE_PROVIDER_SITE_OTHER): Payer: Self-pay | Admitting: Family Medicine

## 2021-07-12 DIAGNOSIS — E559 Vitamin D deficiency, unspecified: Secondary | ICD-10-CM

## 2021-07-14 NOTE — Telephone Encounter (Signed)
Dr. O

## 2021-07-15 ENCOUNTER — Encounter (INDEPENDENT_AMBULATORY_CARE_PROVIDER_SITE_OTHER): Payer: Self-pay | Admitting: Family Medicine

## 2021-07-15 ENCOUNTER — Ambulatory Visit (INDEPENDENT_AMBULATORY_CARE_PROVIDER_SITE_OTHER): Payer: BC Managed Care – PPO | Admitting: Family Medicine

## 2021-07-15 ENCOUNTER — Other Ambulatory Visit: Payer: Self-pay

## 2021-07-15 VITALS — BP 126/77 | HR 72 | Temp 98.6°F | Ht 66.0 in | Wt 167.0 lb

## 2021-07-15 DIAGNOSIS — E8881 Metabolic syndrome: Secondary | ICD-10-CM | POA: Diagnosis not present

## 2021-07-15 DIAGNOSIS — E559 Vitamin D deficiency, unspecified: Secondary | ICD-10-CM

## 2021-07-15 DIAGNOSIS — E7849 Other hyperlipidemia: Secondary | ICD-10-CM | POA: Diagnosis not present

## 2021-07-15 DIAGNOSIS — Z6836 Body mass index (BMI) 36.0-36.9, adult: Secondary | ICD-10-CM

## 2021-07-15 MED ORDER — VITAMIN D (ERGOCALCIFEROL) 1.25 MG (50000 UNIT) PO CAPS: 50000.0000 [IU] | ORAL_CAPSULE | ORAL | 0 refills | Status: DC

## 2021-07-16 ENCOUNTER — Encounter (INDEPENDENT_AMBULATORY_CARE_PROVIDER_SITE_OTHER): Payer: Self-pay

## 2021-07-16 NOTE — Progress Notes (Signed)
Chief Complaint:   OBESITY Theresa Johnson is here to discuss her progress with her obesity treatment plan along with follow-up of her obesity related diagnoses. Theresa Johnson is on the Category 3 Plan or keeping a food journal and adhering to recommended goals of 1500-1600 calories and 120+ grams protein and states she is following her eating plan approximately 40% of the time. Theresa Johnson states she is doing Pilates 50 minutes 4 times per week.  Today's visit was #: 34 Starting weight: 227 lbs Starting date: 10/03/2020 Today's weight: 167 lbs Today's date: 07/15/2021 Total lbs lost to date: 60 Total lbs lost since last in-office visit: 0  Interim History: Theresa Johnson went to [place] for a funeral with her mom and family. Mom ended up with acute CHF flair and EMS was called for her mom, who is not in the hospital at [facility]. Pt did very well with holiday and with seeing family in [place] for the funeral. She is very in control of choices and portions.  Subjective:   1. Insulin resistance Pt's fasting insulin was last checked 5-6 months ago. She denies cravings for carbs or hunger. Medication: None  2. Vitamin D deficiency Pt's Vit D was at goal 5-6 months ago. She is currently taking prescription vitamin D 50,000 IU each week. She denies nausea, vomiting or muscle weakness.  3. Other hyperlipidemia Lipid panel last checked 5-6 months ago. Pt is diet controlled with very nice improvements in LDL from initial labs.  Assessment/Plan:   Orders Placed This Encounter  Procedures   Hemoglobin A1c   Insulin, random   Lipid Panel With LDL/HDL Ratio   VITAMIN D 25 Hydroxy (Vit-D Deficiency, Fractures)    Medications Discontinued During This Encounter  Medication Reason   Vitamin D, Ergocalciferol, (DRISDOL) 1.25 MG (50000 UNIT) CAPS capsule Reorder     Meds ordered this encounter  Medications   Vitamin D, Ergocalciferol, (DRISDOL) 1.25 MG (50000 UNIT) CAPS capsule    Sig: Take 1 capsule (50,000 Units total) by  mouth every 7 (seven) days.    Dispense:  4 capsule    Refill:  0     1. Insulin resistance Theresa Johnson will continue to work on weight loss, exercise, and decreasing simple carbohydrates to help decrease the risk of diabetes. Theresa Johnson agreed to follow-up with [place] as directed to closely monitor her progress. Symptoms are well controlled and diet and food choices are great. Recheck labs in the near future.  - Hemoglobin A1c - Insulin, random  2. Vitamin D deficiency Low Vitamin D level contributes to fatigue and are associated with obesity, breast, and colon cancer. She agrees to continue to take prescription Vitamin D 50,000 IU every week and will follow-up for routine testing of Vitamin D, at least 2-3 times per year to avoid over-replacement. Recheck labs on 07/31/21.  Refill- Vitamin D, Ergocalciferol, (DRISDOL) 1.25 MG (50000 UNIT) CAPS capsule; Take 1 capsule (50,000 Units total) by mouth every 7 (seven) days.  Dispense: 4 capsule; Refill: 0  - VITAMIN D 25 Hydroxy (Vit-D Deficiency, Fractures)  3. Other hyperlipidemia Cardiovascular risk and specific lipid/LDL goals reviewed.  We discussed several lifestyle modifications today and Theresa Johnson will continue to work on diet, exercise and weight loss efforts. Orders and follow up as documented in patient record. Continue prudent nutritional plan and decreasing saturated and trans fats.  Counseling Intensive lifestyle modifications are the first line treatment for this issue. Dietary changes: Increase soluble fiber. Decrease simple carbohydrates. Exercise changes: Moderate to vigorous-intensity aerobic activity  150 minutes per week if tolerated. Lipid-lowering medications: see documented in medical record. Recheck labs 07/31/21.  - Lipid Panel With LDL/HDL Ratio  4. Obesity BMI today is 92  Theresa Johnson is currently in the action stage of change. As such, her goal is to continue with weight loss efforts. She has agreed to the Category 3 Plan or  keeping a food journal and adhering to recommended goals of 1500-1600 calories and 120+ grams protein.   Exercise goals:  As is  Behavioral modification strategies: planning for success.  Theresa Johnson has agreed to follow-up with our clinic in 2-3 weeks (told to come fasting for blood work 07/31/21 at 10:20 AM). She was informed of the importance of frequent follow-up visits to maximize her success with intensive lifestyle modifications for her multiple health conditions.   Theresa Johnson was informed we would discuss her lab results at her next visit unless there is a critical issue that needs to be addressed sooner. Theresa Johnson agreed to keep her next visit at the agreed upon time to discuss these results.  Objective:   Blood pressure 126/77, pulse 72, temperature 98.6 F (37 C), height 5\' 6"  (1.676 m), weight 167 lb (75.8 kg), SpO2 100 %. Body mass index is 26.95 kg/m.  General: Cooperative, alert, well developed, in no acute distress. HEENT: Conjunctivae and lids unremarkable. Cardiovascular: Regular rhythm.  Lungs: Normal work of breathing. Neurologic: No focal deficits.   Lab Results  Component Value Date   CREATININE 0.74 05/19/2021   BUN 17 05/19/2021   NA 139 05/19/2021   K 3.6 05/19/2021   CL 111 05/19/2021   CO2 23 05/19/2021   Lab Results  Component Value Date   ALT 27 10/03/2020   AST 33 10/03/2020   ALKPHOS 99 10/03/2020   BILITOT 0.6 10/03/2020   Lab Results  Component Value Date   HGBA1C 5.4 10/03/2020   Lab Results  Component Value Date   INSULIN 6.1 02/04/2021   INSULIN 5.5 10/03/2020   Lab Results  Component Value Date   TSH 1.590 10/03/2020   Lab Results  Component Value Date   CHOL 199 02/04/2021   HDL 49 02/04/2021   LDLCALC 136 (H) 02/04/2021   TRIG 79 02/04/2021   CHOLHDL 4.1 02/04/2021   Lab Results  Component Value Date   VD25OH 58.4 02/04/2021   VD25OH 25.7 (L) 10/03/2020   VD25OH 23.35 (L) 07/03/2020   Lab Results  Component Value Date    WBC 8.4 05/19/2021   HGB 13.4 05/19/2021   HCT 40.1 05/19/2021   MCV 101.0 (H) 05/19/2021   PLT 244 05/19/2021   Lab Results  Component Value Date   FERRITIN 234.7 07/03/2020    Attestation Statements:   Reviewed by clinician on day of visit: allergies, medications, problem list, medical history, surgical history, family history, social history, and previous encounter notes.  Coral Ceo, CMA, am acting as transcriptionist for [employer], DO.  I have reviewed the above documentation for accuracy and completeness, and I agree with the above. Marjory Sneddon, D.O.  The [place] was signed into law in 2016 which includes the topic of electronic health records.  This provides immediate access to information in MyChart.  This includes consultation notes, operative notes, office notes, lab results and pathology reports.  If you have any questions about what you read please let us know at your next visit so we can discuss your concerns and take corrective action if need be.  We  are right here with you.

## 2021-07-31 ENCOUNTER — Ambulatory Visit (INDEPENDENT_AMBULATORY_CARE_PROVIDER_SITE_OTHER): Payer: BC Managed Care – PPO | Admitting: Family Medicine

## 2021-07-31 ENCOUNTER — Other Ambulatory Visit: Payer: Self-pay

## 2021-07-31 ENCOUNTER — Encounter (INDEPENDENT_AMBULATORY_CARE_PROVIDER_SITE_OTHER): Payer: Self-pay | Admitting: Family Medicine

## 2021-07-31 VITALS — BP 118/66 | HR 64 | Temp 98.4°F | Ht 66.0 in | Wt 162.0 lb

## 2021-07-31 DIAGNOSIS — Z6826 Body mass index (BMI) 26.0-26.9, adult: Secondary | ICD-10-CM | POA: Diagnosis not present

## 2021-07-31 DIAGNOSIS — E559 Vitamin D deficiency, unspecified: Secondary | ICD-10-CM

## 2021-07-31 DIAGNOSIS — E7849 Other hyperlipidemia: Secondary | ICD-10-CM | POA: Diagnosis not present

## 2021-07-31 DIAGNOSIS — E669 Obesity, unspecified: Secondary | ICD-10-CM

## 2021-07-31 DIAGNOSIS — E8881 Metabolic syndrome: Secondary | ICD-10-CM | POA: Diagnosis not present

## 2021-07-31 DIAGNOSIS — Z6836 Body mass index (BMI) 36.0-36.9, adult: Secondary | ICD-10-CM

## 2021-07-31 MED ORDER — VITAMIN D (ERGOCALCIFEROL) 1.25 MG (50000 UNIT) PO CAPS: 50000.0000 [IU] | ORAL_CAPSULE | ORAL | 0 refills | Status: DC

## 2021-07-31 NOTE — Progress Notes (Signed)
Chief Complaint:   OBESITY Theresa Johnson is here to discuss her progress with her obesity treatment plan along with follow-up of her obesity related diagnoses. Theresa Johnson is on the Category 3 Plan and keeping a food journal and adhering to recommended goals of 1500-1600 calories and 120 grams of protein and states she is following her eating plan approximately 85% of the time. Theresa Johnson states she is doing Pilates 60 minutes 4 times per week.  Today's visit was #: 14 Starting weight: 227 lbs Starting date: 10/03/2020 Today's weight: 162 lbs Today's date: 07/31/2021 Total lbs lost to date: 65 lbs Total lbs lost since last in-office visit: 5 lbs  Interim History: Theresa Johnson is has increased stress with mom. She notes breakfast and lunch has been great. Theresa Johnson is here for a follow up office visit. We reviewed her meal plan and questions were answered. Patient's food recall appears to be accurate and consistent with what is on plan when she is following it. When eating on plan, her hunger and cravings are well controlled.    Subjective:   1. Vitamin D deficiency Theresa Johnson is currently taking prescription vitamin D 50,000 IU each week. She denies nausea, vomiting or muscle weakness.  Assessment/Plan:  No orders of the defined types were placed in this encounter.   Medications Discontinued During This Encounter  Medication Reason   Vitamin D, Ergocalciferol, (DRISDOL) 1.25 MG (50000 UNIT) CAPS capsule Reorder     Meds ordered this encounter  Medications   Vitamin D, Ergocalciferol, (DRISDOL) 1.25 MG (50000 UNIT) CAPS capsule    Sig: Take 1 capsule (50,000 Units total) by mouth every 7 (seven) days.    Dispense:  4 capsule    Refill:  0     1. Vitamin D deficiency Low Vitamin D level contributes to fatigue and are associated with obesity, breast, and colon cancer. We will refill prescription Vitamin D 50,000 IU every week for 1 month. Theresa Johnson will follow-up for routine testing of  Vitamin D, at least 2-3 times per year to avoid over-replacement.  - Vitamin D, Ergocalciferol, (DRISDOL) 1.25 MG (50000 UNIT) CAPS capsule; Take 1 capsule (50,000 Units total) by mouth every 7 (seven) days.  Dispense: 4 capsule; Refill: 0  2. Obesity with current BMI of 26.2 Theresa Johnson is currently in the action stage of change. As such, her goal is to continue with weight loss efforts. She has agreed to change to the Category 3 Plan.   We will go over labs drawn today at next office visit.   Exercise goals:  As is.  Behavioral modification strategies: increasing lean protein intake, decreasing simple carbohydrates, and avoiding temptations.  Theresa Johnson has agreed to follow-up with our clinic in 3-4 weeks. She was informed of the importance of frequent follow-up visits to maximize her success with intensive lifestyle modifications for her multiple health conditions.   Objective:   Blood pressure 118/66, pulse 64, temperature 98.4 F (36.9 C), height 5\' 6"  (1.676 m), weight 162 lb (73.5 kg), SpO2 97 %. Body mass index is 26.15 kg/m.  General: Cooperative, alert, well developed, in no acute distress. HEENT: Conjunctivae and lids unremarkable. Cardiovascular: Regular rhythm.  Lungs: Normal work of breathing. Neurologic: No focal deficits.   Lab Results  Component Value Date   CREATININE 0.74 05/19/2021   BUN 17 05/19/2021   NA 139 05/19/2021   K 3.6 05/19/2021   CL 111 05/19/2021   CO2 23 05/19/2021   Lab Results  Component Value Date  ALT 27 10/03/2020   AST 33 10/03/2020   ALKPHOS 99 10/03/2020   BILITOT 0.6 10/03/2020   Lab Results  Component Value Date   HGBA1C 5.4 10/03/2020   Lab Results  Component Value Date   INSULIN 6.1 02/04/2021   INSULIN 5.5 10/03/2020   Lab Results  Component Value Date   TSH 1.590 10/03/2020   Lab Results  Component Value Date   CHOL 199 02/04/2021   HDL 49 02/04/2021   LDLCALC 136 (H) 02/04/2021   TRIG 79 02/04/2021   CHOLHDL 4.1  02/04/2021   Lab Results  Component Value Date   VD25OH 58.4 02/04/2021   VD25OH 25.7 (L) 10/03/2020   VD25OH 23.35 (L) 07/03/2020   Lab Results  Component Value Date   WBC 8.4 05/19/2021   HGB 13.4 05/19/2021   HCT 40.1 05/19/2021   MCV 101.0 (H) 05/19/2021   PLT 244 05/19/2021   Lab Results  Component Value Date   FERRITIN 234.7 07/03/2020   Attestation Statements:   Reviewed by clinician on day of visit: allergies, medications, problem list, medical history, surgical history, family history, social history, and previous encounter notes.  I, Theresa Johnson, RMA, am acting as Location manager for [employer], DO.  I have reviewed the above documentation for accuracy and completeness, and I agree with the above. Theresa Johnson, D.O.  The [place] was signed into law in 2016 which includes the topic of electronic health records.  This provides immediate access to information in MyChart.  This includes consultation notes, operative notes, office notes, lab results and pathology reports.  If you have any questions about what you read please let us know at your next visit so we can discuss your concerns and take corrective action if need be.  We are right here with you.

## 2021-08-01 LAB — HEMOGLOBIN A1C
Est. average glucose Bld gHb Est-mCnc: 111 mg/dL
Hgb A1c MFr Bld: 5.5 % (ref 4.8–5.6)

## 2021-08-01 LAB — LIPID PANEL WITH LDL/HDL RATIO
Cholesterol, Total: 208 mg/dL — ABNORMAL HIGH (ref 100–199)
HDL: 60 mg/dL (ref >39)
LDL Chol Calc (NIH): 137 mg/dL — ABNORMAL HIGH (ref 0–99)
LDL/HDL Ratio: 2.3 ratio (ref 0.0–3.2)
Triglycerides: 61 mg/dL (ref 0–149)
VLDL Cholesterol Cal: 11 mg/dL (ref 5–40)

## 2021-08-01 LAB — VITAMIN D 25 HYDROXY (VIT D DEFICIENCY, FRACTURES): Vit D, 25-Hydroxy: 50.8 ng/mL (ref 30.0–100.0)

## 2021-08-01 LAB — INSULIN, RANDOM: INSULIN: 3.3 u[IU]/mL (ref 2.6–24.9)

## 2021-08-13 ENCOUNTER — Other Ambulatory Visit: Payer: Self-pay

## 2021-08-13 ENCOUNTER — Encounter (INDEPENDENT_AMBULATORY_CARE_PROVIDER_SITE_OTHER): Payer: Self-pay | Admitting: Family Medicine

## 2021-08-13 ENCOUNTER — Ambulatory Visit (INDEPENDENT_AMBULATORY_CARE_PROVIDER_SITE_OTHER): Payer: BC Managed Care – PPO | Admitting: Family Medicine

## 2021-08-13 VITALS — BP 138/75 | HR 63 | Temp 98.0°F | Ht 66.0 in | Wt 167.0 lb

## 2021-08-13 DIAGNOSIS — E559 Vitamin D deficiency, unspecified: Secondary | ICD-10-CM

## 2021-08-13 DIAGNOSIS — Z6827 Body mass index (BMI) 27.0-27.9, adult: Secondary | ICD-10-CM

## 2021-08-13 DIAGNOSIS — E7849 Other hyperlipidemia: Secondary | ICD-10-CM

## 2021-08-13 DIAGNOSIS — E8881 Metabolic syndrome: Secondary | ICD-10-CM | POA: Diagnosis not present

## 2021-08-13 DIAGNOSIS — Z9189 Other specified personal risk factors, not elsewhere classified: Secondary | ICD-10-CM | POA: Diagnosis not present

## 2021-08-13 MED ORDER — VITAMIN D (ERGOCALCIFEROL) 1.25 MG (50000 UNIT) PO CAPS: 50000.0000 [IU] | ORAL_CAPSULE | ORAL | 0 refills | Status: DC

## 2021-08-13 NOTE — Patient Instructions (Signed)
The 10-year ASCVD risk score (Arnett DK, et al., 2019) is: 2.9%   Values used to calculate the score:     Age: 51 years     Sex: Female     Is Non-Hispanic African American: Yes     Diabetic: No     Tobacco smoker: No     Systolic Blood Pressure: 234 mmHg     Is BP treated: No     HDL Cholesterol: 60 mg/dL     Total Cholesterol: 208 mg/dL

## 2021-08-14 NOTE — Progress Notes (Signed)
Chief Complaint:   OBESITY Theresa Johnson is here to discuss her progress with her obesity treatment plan along with follow-up of her obesity related diagnoses. Theresa Johnson is on the Category 3 Plan and states she is following her eating plan approximately 50% of the time. Theresa Johnson states she is doing Pilates 60 minutes 4 times per week.  Today's visit was #: 15 Starting weight: 227 lbs Starting date: 10/03/2020 Today's weight: 167 lbs Today's date: 08/13/2021 Total lbs lost to date: 60 Total lbs lost since last in-office visit: +5  Interim History: Pt is upset she gained weight but she went off plan and ate a lot more carbs and fats, such as bread with butter, chocolate, etc. Also, she decreased her activity levels. She is ready to get back at it. Pt is here to review labs as well.  Subjective:   1. Other hyperlipidemia Discussed labs with patient today. Slightly improved LDL and HDL is not at 60. No need for meds at this time.  The 10-year ASCVD risk score (Arnett DK, et al., 2019) is: 2.9%   Values used to calculate the score:     Age: 16 years     Sex: Female     Is Non-Hispanic African American: Yes     Diabetic: No     Tobacco smoker: No     Systolic Blood Pressure: 448 mmHg     Is BP treated: No     HDL Cholesterol: 60 mg/dL     Total Cholesterol: 208 mg/dL  2. Insulin resistance Discussed labs with patient today. Pt reports carb cravings are bad after she has been eating a lot of them over the holidays.  3. Vitamin D deficiency Discussed labs with patient today. She is currently taking prescription vitamin D 50,000 IU each week. She denies nausea, vomiting or muscle weakness.  4. At risk for heart disease Theresa Johnson is at a higher than average risk for cardiovascular disease due to obesity.   Assessment/Plan:  No orders of the defined types were placed in this encounter.   Medications Discontinued During This Encounter  Medication Reason   Vitamin D, Ergocalciferol,  (DRISDOL) 1.25 MG (50000 UNIT) CAPS capsule Reorder     Meds ordered this encounter  Medications   Vitamin D, Ergocalciferol, (DRISDOL) 1.25 MG (50000 UNIT) CAPS capsule    Sig: Take 1 capsule (50,000 Units total) by mouth every 7 (seven) days.    Dispense:  4 capsule    Refill:  0     1. Other hyperlipidemia Cardiovascular risk and specific lipid/LDL goals reviewed.  We discussed several lifestyle modifications today and Deshondra will continue to work on diet, exercise and weight loss efforts. Orders and follow up as documented in patient record. Slightly improved LDL and HDL is now at 60.  Counseling Intensive lifestyle modifications are the first line treatment for this issue. Dietary changes: Increase soluble fiber. Decrease simple carbohydrates. Exercise changes: Moderate to vigorous-intensity aerobic activity 150 minutes per week if tolerated. Lipid-lowering medications: see documented in medical record.  2. Insulin resistance Improved labs. Pt declines meds and education done on Metformin. Handout provided.   3. Vitamin D deficiency Low Vitamin D level contributes to fatigue and are associated with obesity, breast, and colon cancer. She agrees to continue to take prescription Vitamin D 50,000 IU every week and will follow-up for routine testing of Vitamin D, at least 2-3 times per year to avoid over-replacement.  Refill- Vitamin D, Ergocalciferol, (DRISDOL) 1.25 MG (50000 UNIT)  CAPS capsule; Take 1 capsule (50,000 Units total) by mouth every 7 (seven) days.  Dispense: 4 capsule; Refill: 0  4. At risk for heart disease Theresa Johnson was given approximately 9 minutes of coronary artery disease prevention counseling today. She is 51 y.o. female and has risk factors for heart disease including obesity. We discussed intensive lifestyle modifications today with an emphasis on specific weight loss instructions and strategies.   Repetitive spaced learning was employed today to elicit superior  memory formation and behavioral change.  5. Obesity with current BMI of 27.0  Theresa Johnson is currently in the action stage of change. As such, her goal is to continue with weight loss efforts. She has agreed to the Category 3 Plan.   Pt goals: 6 days a week of Pilates; Get back on plan; journal and bring in log to next OV.  Exercise goals:  As is  Behavioral modification strategies: avoiding temptations and planning for success.  Theresa Johnson has agreed to follow-up with our clinic in 2-3 weeks. She was informed of the importance of frequent follow-up visits to maximize her success with intensive lifestyle modifications for her multiple health conditions.   Objective:   Blood pressure 138/75, pulse 63, temperature 98 F (36.7 C), height 5\' 6"  (1.676 m), weight 167 lb (75.8 kg), SpO2 99 %. Body mass index is 26.95 kg/m.  General: Cooperative, alert, well developed, in no acute distress. HEENT: Conjunctivae and lids unremarkable. Cardiovascular: Regular rhythm.  Lungs: Normal work of breathing. Neurologic: No focal deficits.   Lab Results  Component Value Date   CREATININE 0.74 05/19/2021   BUN 17 05/19/2021   NA 139 05/19/2021   K 3.6 05/19/2021   CL 111 05/19/2021   CO2 23 05/19/2021   Lab Results  Component Value Date   ALT 27 10/03/2020   AST 33 10/03/2020   ALKPHOS 99 10/03/2020   BILITOT 0.6 10/03/2020   Lab Results  Component Value Date   HGBA1C 5.5 07/31/2021   HGBA1C 5.4 10/03/2020   Lab Results  Component Value Date   INSULIN 3.3 07/31/2021   INSULIN 6.1 02/04/2021   INSULIN 5.5 10/03/2020   Lab Results  Component Value Date   TSH 1.590 10/03/2020   Lab Results  Component Value Date   CHOL 208 (H) 07/31/2021   HDL 60 07/31/2021   LDLCALC 137 (H) 07/31/2021   TRIG 61 07/31/2021   CHOLHDL 4.1 02/04/2021   Lab Results  Component Value Date   VD25OH 50.8 07/31/2021   VD25OH 58.4 02/04/2021   VD25OH 25.7 (L) 10/03/2020   Lab Results  Component Value  Date   WBC 8.4 05/19/2021   HGB 13.4 05/19/2021   HCT 40.1 05/19/2021   MCV 101.0 (H) 05/19/2021   PLT 244 05/19/2021   Lab Results  Component Value Date   FERRITIN 234.7 07/03/2020   Attestation Statements:   Reviewed by clinician on day of visit: allergies, medications, problem list, medical history, surgical history, family history, social history, and previous encounter notes.  Coral Ceo, CMA, am acting as transcriptionist for [employer], DO.  I have reviewed the above documentation for accuracy and completeness, and I agree with the above. Marjory Sneddon, D.O.  The [place] was signed into law in 2016 which includes the topic of electronic health records.  This provides immediate access to information in MyChart.  This includes consultation notes, operative notes, office notes, lab results and pathology reports.  If you have any questions  about what you read please let us know at your next visit so we can discuss your concerns and take corrective action if need be.  We are right here with you.

## 2021-09-01 ENCOUNTER — Encounter (INDEPENDENT_AMBULATORY_CARE_PROVIDER_SITE_OTHER): Payer: Self-pay | Admitting: Family Medicine

## 2021-09-01 ENCOUNTER — Ambulatory Visit (INDEPENDENT_AMBULATORY_CARE_PROVIDER_SITE_OTHER): Payer: BC Managed Care – PPO | Admitting: Family Medicine

## 2021-09-01 ENCOUNTER — Other Ambulatory Visit: Payer: Self-pay

## 2021-09-01 VITALS — BP 102/61 | HR 74 | Temp 98.2°F | Ht 63.0 in | Wt 163.0 lb

## 2021-09-01 DIAGNOSIS — Z9189 Other specified personal risk factors, not elsewhere classified: Secondary | ICD-10-CM | POA: Diagnosis not present

## 2021-09-01 DIAGNOSIS — E8881 Metabolic syndrome: Secondary | ICD-10-CM

## 2021-09-01 DIAGNOSIS — E559 Vitamin D deficiency, unspecified: Secondary | ICD-10-CM | POA: Diagnosis not present

## 2021-09-01 DIAGNOSIS — Z6828 Body mass index (BMI) 28.0-28.9, adult: Secondary | ICD-10-CM

## 2021-09-01 DIAGNOSIS — E669 Obesity, unspecified: Secondary | ICD-10-CM

## 2021-09-01 MED ORDER — VITAMIN D (ERGOCALCIFEROL) 1.25 MG (50000 UNIT) PO CAPS: 50000.0000 [IU] | ORAL_CAPSULE | ORAL | 0 refills | Status: DC

## 2021-09-01 NOTE — Progress Notes (Signed)
Chief Complaint:   OBESITY Theresa Johnson is here to discuss her progress with her obesity treatment plan along with follow-up of her obesity related diagnoses. [name] is on the Category 3 Plan and states Theresa Johnson is following her eating plan approximately 95% of the time. [name] states Theresa Johnson is doing pilates for 60 minutes 6 times per week.  Today's visit was #: 82 Starting weight: 227 lbs Starting date: 10/03/2020 Today's weight: 163 lbs Today's date: 09/01/2021 Total lbs lost to date: 64 Total lbs lost since last in-office visit: 4  Interim History: [name] is getting back on track. Theresa Johnson reports hot flashes are out of control, and Theresa Johnson has an appointment with her GYN in the next week or so. Also her carbohydrate cravings are bad after Theresa Johnson eats pasta, etc.  Subjective:   1. Insulin resistance [name] notes cravings after Theresa Johnson eats only simple carbohydrates.   2. Vitamin D deficiency [name] is currently taking prescription vitamin D 50,000 IU each week. Theresa Johnson denies nausea, vomiting or muscle weakness.  3. At risk for diabetes mellitus [name] is at higher than average risk for developing diabetes due to carbohydrate cravings and elevated insulin.  Assessment/Plan:  No orders of the defined types were placed in this encounter.   Medications Discontinued During This Encounter  Medication Reason   Specialty Vitamins Products (MENOPAUSE SUPPORT PO)    Vitamin D, Ergocalciferol, (DRISDOL) 1.25 MG (50000 UNIT) CAPS capsule Reorder     Meds ordered this encounter  Medications   Vitamin D, Ergocalciferol, (DRISDOL) 1.25 MG (50000 UNIT) CAPS capsule    Sig: Take 1 capsule (50,000 Units total) by mouth every 7 (seven) days.    Dispense:  4 capsule    Refill:  0     1. Insulin resistance Lovada declines medications today (metformin), and Theresa Johnson has read the handout from her last office visit. Theresa Johnson will increase her protein and we will reevaluate at her next office visit.   2. Vitamin D  deficiency We will refill prescription Vitamin D for 1 month. Karmina will follow-up for routine testing of Vitamin D, at least 2-3 times per year to avoid over-replacement.  - Vitamin D, Ergocalciferol, (DRISDOL) 1.25 MG (50000 UNIT) CAPS capsule; Take 1 capsule (50,000 Units total) by mouth every 7 (seven) days.  Dispense: 4 capsule; Refill: 0  3. At risk for diabetes mellitus [name] was given approximately 9 minutes of diabetes education and counseling today. We discussed intensive lifestyle modifications today with an emphasis on weight loss as well as increasing exercise and decreasing simple carbohydrates in her diet. We also reviewed medication options with an emphasis on risk versus benefit of those discussed.   Repetitive spaced learning was employed today to elicit superior memory formation and behavioral change.  4. Obesity with current BMI of 28.9 [name] is currently in the action stage of change. As such, her goal is to continue with weight loss efforts. Theresa Johnson has agreed to the Category 3 Plan.   Several techniques were discussed with the patient to avoid post carbohydrate intake cravings. [name] declines medications and wishes to increase her protein intake at this time.   Exercise goals: As is.  Behavioral modification strategies: decreasing simple carbohydrates, better snacking choices, emotional eating strategies, and planning for success.  [name] has agreed to follow-up with our clinic in 4 weeks. Theresa Johnson was informed of the importance of frequent follow-up visits to maximize her success with intensive lifestyle modifications for her multiple health conditions.   Objective:  Blood pressure 102/61, pulse 74, temperature 98.2 F (36.8 C), height 5\' 3"  (1.6 m), weight 163 lb (73.9 kg), SpO2 98 %. Body mass index is 28.87 kg/m.  General: Cooperative, alert, well developed, in no acute distress. HEENT: Conjunctivae and lids unremarkable. Cardiovascular: Regular rhythm.   Lungs: Normal work of breathing. Neurologic: No focal deficits.   Lab Results  Component Value Date   CREATININE 0.74 05/19/2021   BUN 17 05/19/2021   NA 139 05/19/2021   K 3.6 05/19/2021   CL 111 05/19/2021   CO2 23 05/19/2021   Lab Results  Component Value Date   ALT 27 10/03/2020   AST 33 10/03/2020   ALKPHOS 99 10/03/2020   BILITOT 0.6 10/03/2020   Lab Results  Component Value Date   HGBA1C 5.5 07/31/2021   HGBA1C 5.4 10/03/2020   Lab Results  Component Value Date   INSULIN 3.3 07/31/2021   INSULIN 6.1 02/04/2021   INSULIN 5.5 10/03/2020   Lab Results  Component Value Date   TSH 1.590 10/03/2020   Lab Results  Component Value Date   CHOL 208 (H) 07/31/2021   HDL 60 07/31/2021   LDLCALC 137 (H) 07/31/2021   TRIG 61 07/31/2021   CHOLHDL 4.1 02/04/2021   Lab Results  Component Value Date   VD25OH 50.8 07/31/2021   VD25OH 58.4 02/04/2021   VD25OH 25.7 (L) 10/03/2020   Lab Results  Component Value Date   WBC 8.4 05/19/2021   HGB 13.4 05/19/2021   HCT 40.1 05/19/2021   MCV 101.0 (H) 05/19/2021   PLT 244 05/19/2021   Lab Results  Component Value Date   FERRITIN 234.7 07/03/2020   Attestation Statements:   Reviewed by clinician on day of visit: allergies, medications, problem list, medical history, surgical history, family history, social history, and previous encounter notes.   [name], am acting as transcriptionist for [employer], DO.  I have reviewed the above documentation for accuracy and completeness, and I agree with the above. Marjory Sneddon, D.O.  The [place] was signed into law in 2016 which includes the topic of electronic health records.  This provides immediate access to information in MyChart.  This includes consultation notes, operative notes, office notes, lab results and pathology reports.  If you have any questions about what you read please let us know at your next visit so we can discuss your  concerns and take corrective action if need be.  We are right here with you.

## 2021-09-04 DIAGNOSIS — R232 Flushing: Secondary | ICD-10-CM | POA: Diagnosis not present

## 2021-09-24 ENCOUNTER — Other Ambulatory Visit (INDEPENDENT_AMBULATORY_CARE_PROVIDER_SITE_OTHER): Payer: Self-pay | Admitting: Family Medicine

## 2021-09-24 DIAGNOSIS — E559 Vitamin D deficiency, unspecified: Secondary | ICD-10-CM

## 2021-09-29 DIAGNOSIS — M9902 Segmental and somatic dysfunction of thoracic region: Secondary | ICD-10-CM | POA: Diagnosis not present

## 2021-09-29 DIAGNOSIS — M62830 Muscle spasm of back: Secondary | ICD-10-CM | POA: Diagnosis not present

## 2021-09-29 DIAGNOSIS — M9901 Segmental and somatic dysfunction of cervical region: Secondary | ICD-10-CM | POA: Diagnosis not present

## 2021-09-29 DIAGNOSIS — M25511 Pain in right shoulder: Secondary | ICD-10-CM | POA: Diagnosis not present

## 2021-10-02 ENCOUNTER — Encounter (INDEPENDENT_AMBULATORY_CARE_PROVIDER_SITE_OTHER): Payer: Self-pay | Admitting: Family Medicine

## 2021-10-02 ENCOUNTER — Ambulatory Visit (INDEPENDENT_AMBULATORY_CARE_PROVIDER_SITE_OTHER): Payer: BC Managed Care – PPO | Admitting: Family Medicine

## 2021-10-02 ENCOUNTER — Other Ambulatory Visit: Payer: Self-pay

## 2021-10-02 VITALS — BP 114/73 | HR 87 | Temp 98.8°F | Ht 66.0 in | Wt 161.0 lb

## 2021-10-02 DIAGNOSIS — E669 Obesity, unspecified: Secondary | ICD-10-CM

## 2021-10-02 DIAGNOSIS — Z6826 Body mass index (BMI) 26.0-26.9, adult: Secondary | ICD-10-CM

## 2021-10-02 DIAGNOSIS — E8881 Metabolic syndrome: Secondary | ICD-10-CM | POA: Diagnosis not present

## 2021-10-02 DIAGNOSIS — E559 Vitamin D deficiency, unspecified: Secondary | ICD-10-CM | POA: Diagnosis not present

## 2021-10-02 MED ORDER — VITAMIN D (ERGOCALCIFEROL) 1.25 MG (50000 UNIT) PO CAPS: 50000.0000 [IU] | ORAL_CAPSULE | ORAL | 0 refills | Status: DC

## 2021-10-06 DIAGNOSIS — M9902 Segmental and somatic dysfunction of thoracic region: Secondary | ICD-10-CM | POA: Diagnosis not present

## 2021-10-06 DIAGNOSIS — M25511 Pain in right shoulder: Secondary | ICD-10-CM | POA: Diagnosis not present

## 2021-10-06 DIAGNOSIS — M9901 Segmental and somatic dysfunction of cervical region: Secondary | ICD-10-CM | POA: Diagnosis not present

## 2021-10-06 DIAGNOSIS — M62830 Muscle spasm of back: Secondary | ICD-10-CM | POA: Diagnosis not present

## 2021-10-06 NOTE — Progress Notes (Signed)
Chief Complaint:   OBESITY Theresa Johnson is here to discuss her progress with her obesity treatment plan along with follow-up of her obesity related diagnoses. Jakerria is on the Category 3 Plan and states she is following her eating plan approximately 90% of the time. Pyper states she is doing Pilates 60 minutes 5 times per week.  Today's visit was #: 15 Starting weight: 227 lbs Starting date: 10/03/2020 Today's weight: 161 lbs Today's date: 10/02/2021 Total lbs lost to date: 63 Total lbs lost since last in-office visit: 2  Interim History: Theresa Johnson is here for a follow up office visit.  We reviewed her meal plan and questions were answered.    After speaking with Dustie, her food recall appears to be very accurate and consistent with what is on plan almost 100% of the time. Her hunger and cravings are well controlled.    She is doing a great job experimenting with new recipes and low calorie sauces as well as zero calorie spices. She is also getting creative with meals.   Pt is still exercising 60 minutes 5 days a week and feels great.   Subjective:   1. Vitamin D deficiency Kinberly is tolerating medication(s) well without side effects.  Medication compliance is good and patient appears to be taking it as prescribed.  The patient denies additional concerns regarding this condition.   2. Insulin resistance Deshara's A1c 2 months ago was 5.5 with a normal insulin level of 3.3 on 07/31/2021. She denies carb cravings and hunger. No meds and pt is following plan very closely.   Assessment/Plan:   Medications Discontinued During This Encounter  Medication Reason   Vitamin D, Ergocalciferol, (DRISDOL) 1.25 MG (50000 UNIT) CAPS capsule Reorder     Meds ordered this encounter  Medications   Vitamin D, Ergocalciferol, (DRISDOL) 1.25 MG (50000 UNIT) CAPS capsule    Sig: Take 1 capsule (50,000 Units total) by mouth every 7 (seven) days.    Dispense:  4 capsule     Refill:  0     1. Vitamin D deficiency - Vitamin D (as well as calcium) is important to your health and well being.  - Levels at goal when checked 12/22 - Possible symptoms of low Vitamin D include:  low energy, depressed mood, muscle aches, joint aches, osteoporosis etc. - low Vitamin D levels may be linked to an increased risk of cardiovascular events and even increased risk of cancers- such as colon and breast.  - I recommend pt take a 50K IU weekly prescription vit D - see script below     - we will need to monitor vit D regularly (every 3-4 mo on average) to keep levels within normal limits and prevent over supplementation.  Refill- Vitamin D, Ergocalciferol, (DRISDOL) 1.25 MG (50000 UNIT) CAPS capsule; Take 1 capsule (50,000 Units total) by mouth every 7 (seven) days.  Dispense: 4 capsule; Refill: 0   2. Insulin resistance Continue prudent nutritional plan, exercise, and maintain current weight. Fasting Insulin is at goal.   3. Obesity with current BMI of 26.1 Theresa Johnson is currently in the action stage of change. As such, her goal is to maintain weight for now. She has agreed to the Category 3 Plan.   Pt wishes to maintain weight and continue to improve strength and flexibility.  Exercise goals:  As is  Behavioral modification strategies: meal planning and cooking strategies.  Jamica has agreed to follow-up with our clinic in 4 weeks, then we  may go q 58mo in near future.  She was informed of the importance of frequent follow-up visits to maximize her success with intensive lifestyle modifications for her multiple health conditions.    Objective:   Blood pressure 114/73, pulse 87, temperature 98.8 F (37.1 C), height 5\' 6"  (1.676 m), weight 161 lb (73 kg), SpO2 96 %. Body mass index is 25.99 kg/m.  General: Cooperative, alert, well developed, in no acute distress. HEENT: Conjunctivae and lids unremarkable. Cardiovascular: Regular rhythm.  Lungs: Normal work of  breathing. Neurologic: No focal deficits.   Lab Results  Component Value Date   CREATININE 0.74 05/19/2021   BUN 17 05/19/2021   NA 139 05/19/2021   K 3.6 05/19/2021   CL 111 05/19/2021   CO2 23 05/19/2021   Lab Results  Component Value Date   ALT 27 10/03/2020   AST 33 10/03/2020   ALKPHOS 99 10/03/2020   BILITOT 0.6 10/03/2020   Lab Results  Component Value Date   HGBA1C 5.5 07/31/2021   HGBA1C 5.4 10/03/2020   Lab Results  Component Value Date   INSULIN 3.3 07/31/2021   INSULIN 6.1 02/04/2021   INSULIN 5.5 10/03/2020   Lab Results  Component Value Date   TSH 1.590 10/03/2020   Lab Results  Component Value Date   CHOL 208 (H) 07/31/2021   HDL 60 07/31/2021   LDLCALC 137 (H) 07/31/2021   TRIG 61 07/31/2021   CHOLHDL 4.1 02/04/2021   Lab Results  Component Value Date   VD25OH 50.8 07/31/2021   VD25OH 58.4 02/04/2021   VD25OH 25.7 (L) 10/03/2020   Lab Results  Component Value Date   WBC 8.4 05/19/2021   HGB 13.4 05/19/2021   HCT 40.1 05/19/2021   MCV 101.0 (H) 05/19/2021   PLT 244 05/19/2021   Lab Results  Component Value Date   FERRITIN 234.7 07/03/2020    Attestation Statements:   Reviewed by clinician on day of visit: allergies, medications, problem list, medical history, surgical history, family history, social history, and previous encounter notes.  Coral Ceo, CMA, am acting as transcriptionist for [employer], DO.  I have reviewed the above documentation for accuracy and completeness, and I agree with the above.Marjory Sneddon, D.O.  The [place] was signed into law in 2016 which includes the topic of electronic health records.  This provides immediate access to information in MyChart.  This includes consultation notes, operative notes, office notes, lab results and pathology reports.  If you have any questions about what you read please let us know at your next visit so we can discuss your concerns and take  corrective action if need be.  We are right here with you.

## 2021-10-10 DIAGNOSIS — M9901 Segmental and somatic dysfunction of cervical region: Secondary | ICD-10-CM | POA: Diagnosis not present

## 2021-10-10 DIAGNOSIS — M9902 Segmental and somatic dysfunction of thoracic region: Secondary | ICD-10-CM | POA: Diagnosis not present

## 2021-10-10 DIAGNOSIS — M62830 Muscle spasm of back: Secondary | ICD-10-CM | POA: Diagnosis not present

## 2021-10-10 DIAGNOSIS — M25511 Pain in right shoulder: Secondary | ICD-10-CM | POA: Diagnosis not present

## 2021-10-14 DIAGNOSIS — M62830 Muscle spasm of back: Secondary | ICD-10-CM | POA: Diagnosis not present

## 2021-10-14 DIAGNOSIS — M9901 Segmental and somatic dysfunction of cervical region: Secondary | ICD-10-CM | POA: Diagnosis not present

## 2021-10-14 DIAGNOSIS — M25511 Pain in right shoulder: Secondary | ICD-10-CM | POA: Diagnosis not present

## 2021-10-14 DIAGNOSIS — M9902 Segmental and somatic dysfunction of thoracic region: Secondary | ICD-10-CM | POA: Diagnosis not present

## 2021-10-17 DIAGNOSIS — M62830 Muscle spasm of back: Secondary | ICD-10-CM | POA: Diagnosis not present

## 2021-10-17 DIAGNOSIS — M9902 Segmental and somatic dysfunction of thoracic region: Secondary | ICD-10-CM | POA: Diagnosis not present

## 2021-10-17 DIAGNOSIS — M9901 Segmental and somatic dysfunction of cervical region: Secondary | ICD-10-CM | POA: Diagnosis not present

## 2021-10-17 DIAGNOSIS — M25511 Pain in right shoulder: Secondary | ICD-10-CM | POA: Diagnosis not present

## 2021-10-28 DIAGNOSIS — M9902 Segmental and somatic dysfunction of thoracic region: Secondary | ICD-10-CM | POA: Diagnosis not present

## 2021-10-28 DIAGNOSIS — M25511 Pain in right shoulder: Secondary | ICD-10-CM | POA: Diagnosis not present

## 2021-10-28 DIAGNOSIS — M9901 Segmental and somatic dysfunction of cervical region: Secondary | ICD-10-CM | POA: Diagnosis not present

## 2021-10-28 DIAGNOSIS — M62830 Muscle spasm of back: Secondary | ICD-10-CM | POA: Diagnosis not present

## 2021-10-29 ENCOUNTER — Ambulatory Visit (INDEPENDENT_AMBULATORY_CARE_PROVIDER_SITE_OTHER): Payer: BC Managed Care – PPO | Admitting: Family Medicine

## 2021-10-31 DIAGNOSIS — M9901 Segmental and somatic dysfunction of cervical region: Secondary | ICD-10-CM | POA: Diagnosis not present

## 2021-10-31 DIAGNOSIS — M62830 Muscle spasm of back: Secondary | ICD-10-CM | POA: Diagnosis not present

## 2021-10-31 DIAGNOSIS — M9902 Segmental and somatic dysfunction of thoracic region: Secondary | ICD-10-CM | POA: Diagnosis not present

## 2021-10-31 DIAGNOSIS — M25511 Pain in right shoulder: Secondary | ICD-10-CM | POA: Diagnosis not present

## 2021-11-04 DIAGNOSIS — M62830 Muscle spasm of back: Secondary | ICD-10-CM | POA: Diagnosis not present

## 2021-11-04 DIAGNOSIS — M9902 Segmental and somatic dysfunction of thoracic region: Secondary | ICD-10-CM | POA: Diagnosis not present

## 2021-11-04 DIAGNOSIS — M9901 Segmental and somatic dysfunction of cervical region: Secondary | ICD-10-CM | POA: Diagnosis not present

## 2021-11-04 DIAGNOSIS — M25511 Pain in right shoulder: Secondary | ICD-10-CM | POA: Diagnosis not present

## 2021-11-07 DIAGNOSIS — M9901 Segmental and somatic dysfunction of cervical region: Secondary | ICD-10-CM | POA: Diagnosis not present

## 2021-11-07 DIAGNOSIS — M9902 Segmental and somatic dysfunction of thoracic region: Secondary | ICD-10-CM | POA: Diagnosis not present

## 2021-11-07 DIAGNOSIS — M25511 Pain in right shoulder: Secondary | ICD-10-CM | POA: Diagnosis not present

## 2021-11-07 DIAGNOSIS — M62830 Muscle spasm of back: Secondary | ICD-10-CM | POA: Diagnosis not present

## 2021-11-12 DIAGNOSIS — M25511 Pain in right shoulder: Secondary | ICD-10-CM | POA: Diagnosis not present

## 2021-11-12 DIAGNOSIS — M9901 Segmental and somatic dysfunction of cervical region: Secondary | ICD-10-CM | POA: Diagnosis not present

## 2021-11-12 DIAGNOSIS — M62830 Muscle spasm of back: Secondary | ICD-10-CM | POA: Diagnosis not present

## 2021-11-12 DIAGNOSIS — M9902 Segmental and somatic dysfunction of thoracic region: Secondary | ICD-10-CM | POA: Diagnosis not present

## 2021-11-24 ENCOUNTER — Ambulatory Visit (INDEPENDENT_AMBULATORY_CARE_PROVIDER_SITE_OTHER): Payer: BC Managed Care – PPO | Admitting: Family Medicine

## 2021-11-24 ENCOUNTER — Encounter (INDEPENDENT_AMBULATORY_CARE_PROVIDER_SITE_OTHER): Payer: Self-pay | Admitting: Family Medicine

## 2021-11-24 VITALS — BP 127/69 | HR 81 | Temp 98.6°F | Ht 66.0 in | Wt 162.0 lb

## 2021-11-24 DIAGNOSIS — Z6826 Body mass index (BMI) 26.0-26.9, adult: Secondary | ICD-10-CM

## 2021-11-24 DIAGNOSIS — Z9189 Other specified personal risk factors, not elsewhere classified: Secondary | ICD-10-CM

## 2021-11-24 DIAGNOSIS — E7849 Other hyperlipidemia: Secondary | ICD-10-CM

## 2021-11-24 DIAGNOSIS — M62830 Muscle spasm of back: Secondary | ICD-10-CM | POA: Diagnosis not present

## 2021-11-24 DIAGNOSIS — E559 Vitamin D deficiency, unspecified: Secondary | ICD-10-CM | POA: Diagnosis not present

## 2021-11-24 DIAGNOSIS — E669 Obesity, unspecified: Secondary | ICD-10-CM

## 2021-11-24 DIAGNOSIS — E8881 Metabolic syndrome: Secondary | ICD-10-CM | POA: Diagnosis not present

## 2021-11-24 DIAGNOSIS — F4320 Adjustment disorder, unspecified: Secondary | ICD-10-CM | POA: Diagnosis not present

## 2021-11-24 DIAGNOSIS — M25511 Pain in right shoulder: Secondary | ICD-10-CM | POA: Diagnosis not present

## 2021-11-24 DIAGNOSIS — M9902 Segmental and somatic dysfunction of thoracic region: Secondary | ICD-10-CM | POA: Diagnosis not present

## 2021-11-24 DIAGNOSIS — M9901 Segmental and somatic dysfunction of cervical region: Secondary | ICD-10-CM | POA: Diagnosis not present

## 2021-11-24 MED ORDER — VITAMIN D (ERGOCALCIFEROL) 1.25 MG (50000 UNIT) PO CAPS: 50000.0000 [IU] | ORAL_CAPSULE | ORAL | 0 refills | Status: DC

## 2021-11-25 LAB — HEMOGLOBIN A1C
Est. average glucose Bld gHb Est-mCnc: 105 mg/dL
Hgb A1c MFr Bld: 5.3 % (ref 4.8–5.6)

## 2021-11-25 LAB — LIPID PANEL
Chol/HDL Ratio: 2.5 ratio (ref 0.0–4.4)
Cholesterol, Total: 194 mg/dL (ref 100–199)
HDL: 77 mg/dL (ref >39)
LDL Chol Calc (NIH): 106 mg/dL — ABNORMAL HIGH (ref 0–99)
Triglycerides: 61 mg/dL (ref 0–149)
VLDL Cholesterol Cal: 11 mg/dL (ref 5–40)

## 2021-11-25 LAB — VITAMIN D 25 HYDROXY (VIT D DEFICIENCY, FRACTURES): Vit D, 25-Hydroxy: 49.1 ng/mL (ref 30.0–100.0)

## 2021-11-25 LAB — INSULIN, RANDOM: INSULIN: 3.1 u[IU]/mL (ref 2.6–24.9)

## 2021-11-28 DIAGNOSIS — M62830 Muscle spasm of back: Secondary | ICD-10-CM | POA: Diagnosis not present

## 2021-11-28 DIAGNOSIS — M9901 Segmental and somatic dysfunction of cervical region: Secondary | ICD-10-CM | POA: Diagnosis not present

## 2021-11-28 DIAGNOSIS — M9902 Segmental and somatic dysfunction of thoracic region: Secondary | ICD-10-CM | POA: Diagnosis not present

## 2021-11-28 DIAGNOSIS — M25511 Pain in right shoulder: Secondary | ICD-10-CM | POA: Diagnosis not present

## 2021-12-04 NOTE — Progress Notes (Signed)
? ? ? ?Chief Complaint:  ? ?OBESITY ?Theresa Johnson is here to discuss her progress with her obesity treatment plan along with follow-up of her obesity related diagnoses. Theresa Johnson is on the Category 3 Plan and states she is following her eating plan approximately 65% of the time. Theresa Johnson states she is doing Pilates for 50 minutes 3 times per week. ? ?Today's visit was #: 18 ?Starting weight: 227 lbs ?Starting date: 10/03/2020 ?Today's weight: 162 lbs ?Today's date: 11/24/2021 ?Total lbs lost to date: 3 ?Total lbs lost since last in-office visit: 0 ? ?Interim History: Theresa Johnson went on vacation in [place] for 1 week and vacation in [place] for 10 days. She has continued her resistance training and cardio.  ? ?Subjective:  ? ?1. Vitamin D deficiency ?Theresa Johnson is currently taking prescription vitamin D 50,000 IU each week. She denies nausea, vomiting or muscle weakness. ? ?2. Other hyperlipidemia ?Theresa Johnson has hyperlipidemia and has been trying to improve her cholesterol levels with intensive lifestyle modifications including a low saturated fat diet, exercise, and weight loss. She is not on medications, and denies any chest pain, claudication, or myalgias. ? ?3. Insulin resistance ?Theresa Johnson will continue to work on weight loss, exercise, and decreasing simple carbohydrates to help decrease the risk of diabetes. Theresa Johnson agreed to follow-up with [place] as directed to closely monitor her progress. ? ?4. Adjustment disorder, caregiver stress ?Theresa Johnson is stressed with dealing with her mom's illness and taking care of her. She has support with her mom's care team and she gets counseling.  ? ?5. At risk for depression ?Theresa Johnson is at elevated risk of depression due to caregiver stress.  ? ?Assessment/Plan:  ? ?Orders Placed This Encounter  ?Procedures  ? VITAMIN D 25 Hydroxy (Vit-D Deficiency, Fractures)  ? Insulin, random  ? Lipid panel  ? Hemoglobin A1c  ? ? ?Medications Discontinued During This Encounter  ?Medication Reason  ? Vitamin D,  Ergocalciferol, (DRISDOL) 1.25 MG (50000 UNIT) CAPS capsule Reorder  ?  ? ?Meds ordered this encounter  ?Medications  ? Vitamin D, Ergocalciferol, (DRISDOL) 1.25 MG (50000 UNIT) CAPS capsule  ?  Sig: Take 1 capsule (50,000 Units total) by mouth every 7 (seven) days.  ?  Dispense:  4 capsule  ?  Refill:  0  ?  ? ?1. Vitamin D deficiency ?We will check labs today, and we will refill prescription Vitamin D for 1 month. Theresa Johnson will follow-up for routine testing of Vitamin D, at least 2-3 times per year to avoid over-replacement. ? ?- Vitamin D, Ergocalciferol, (DRISDOL) 1.25 MG (50000 UNIT) CAPS capsule; Take 1 capsule (50,000 Units total) by mouth every 7 (seven) days.  Dispense: 4 capsule; Refill: 0 ?- VITAMIN D 25 Hydroxy (Vit-D Deficiency, Fractures) ? ?2. Other hyperlipidemia ?Cardiovascular risk and specific lipid/LDL goals reviewed.  We discussed several lifestyle modifications today. We will check labs today. Theresa Johnson will continue to work on diet, exercise and weight loss efforts. Orders and follow up as documented in patient record.  ? ?Counseling ?Intensive lifestyle modifications are the first line treatment for this issue. ?Dietary changes: Increase soluble fiber. Decrease simple carbohydrates. ?Exercise changes: Moderate to vigorous-intensity aerobic activity 150 minutes per week if tolerated. ?Lipid-lowering medications: see documented in medical record. ? ?- Lipid panel ? ?3. Insulin resistance ?We will check labs today. Theresa Johnson will continue to work on weight loss, exercise, and decreasing simple carbohydrates to help decrease the risk of diabetes. Theresa Johnson agreed to follow-up with [place] as directed to closely monitor her progress. ? ?-  Insulin, random ?- Hemoglobin A1c ? ?4. Adjustment disorder, caregiver stress ?Theresa Johnson will continue self-care activities, and will continue to meditate daily. She will continue exercise and counseling.  ? ?5. At risk for depression ?Theresa Johnson was given approximately 9 minutes  of depression risk counseling today. She has risk factors for depression including stress. We discussed the importance of a healthy work life balance, a healthy relationship with food and a good support system. ? ?Repetitive spaced learning was employed today to elicit superior memory formation and behavioral change. ? ?6. Obesity with current BMI of 26.1 ?Theresa Johnson is currently in the action stage of change. As such, her goal is to continue with weight loss efforts. She has agreed to the Category 3 Plan.  ? ?Exercise goals: As is. ? ?Behavioral modification strategies: emotional eating strategies, celebration eating strategies, and planning for success. ? ?Theresa Johnson has agreed to follow-up with our clinic in 6 to 8 weeks. She was informed of the importance of frequent follow-up visits to maximize her success with intensive lifestyle modifications for her multiple health conditions.  ? ?Theresa Johnson was informed we would discuss her lab results at her next visit unless there is a critical issue that needs to be addressed sooner. Theresa Johnson agreed to keep her next visit at the agreed upon time to discuss these results. ? ?Objective:  ? ?Blood pressure 127/69, pulse 81, temperature 98.6 ?F (37 ?C), height '5\' 6"'$  (1.676 m), weight 162 lb (73.5 kg), SpO2 98 %. ?Body mass index is 26.15 kg/m?. ? ?General: Cooperative, alert, well developed, in no acute distress. ?HEENT: Conjunctivae and lids unremarkable. ?Cardiovascular: Regular rhythm.  ?Lungs: Normal work of breathing. ?Neurologic: No focal deficits.  ? ?Lab Results  ?Component Value Date  ? CREATININE 0.74 05/19/2021  ? BUN 17 05/19/2021  ? NA 139 05/19/2021  ? K 3.6 05/19/2021  ? CL 111 05/19/2021  ? CO2 23 05/19/2021  ? ?Lab Results  ?Component Value Date  ? ALT 27 10/03/2020  ? AST 33 10/03/2020  ? ALKPHOS 99 10/03/2020  ? BILITOT 0.6 10/03/2020  ? ?Lab Results  ?Component Value Date  ? HGBA1C 5.3 11/24/2021  ? HGBA1C 5.5 07/31/2021  ? HGBA1C 5.4 10/03/2020  ? ?Lab Results   ?Component Value Date  ? INSULIN 3.1 11/24/2021  ? INSULIN 3.3 07/31/2021  ? INSULIN 6.1 02/04/2021  ? INSULIN 5.5 10/03/2020  ? ?Lab Results  ?Component Value Date  ? TSH 1.590 10/03/2020  ? ?Lab Results  ?Component Value Date  ? CHOL 194 11/24/2021  ? HDL 77 11/24/2021  ? LDLCALC 106 (H) 11/24/2021  ? TRIG 61 11/24/2021  ? CHOLHDL 2.5 11/24/2021  ? ?Lab Results  ?Component Value Date  ? VD25OH 49.1 11/24/2021  ? VD25OH 50.8 07/31/2021  ? VD25OH 58.4 02/04/2021  ? ?Lab Results  ?Component Value Date  ? WBC 8.4 05/19/2021  ? HGB 13.4 05/19/2021  ? HCT 40.1 05/19/2021  ? MCV 101.0 (H) 05/19/2021  ? PLT 244 05/19/2021  ? ?Lab Results  ?Component Value Date  ? FERRITIN 234.7 07/03/2020  ? ?Attestation Statements:  ? ?Reviewed by clinician on day of visit: allergies, medications, problem list, medical history, surgical history, family history, social history, and previous encounter notes. ? ? ?I, Trixie Dredge, am acting as transcriptionist for [employer], DO. ? ?I have reviewed the above documentation for accuracy and completeness, and I agree with the above. Marjory Sneddon, D.O. ? ?The [place] was signed into law in 2016 which  includes the topic of electronic health records.  This provides immediate access to information in MyChart.  This includes consultation notes, operative notes, office notes, lab results and pathology reports.  If you have any questions about what you read please let us know at your next visit so we can discuss your concerns and take corrective action if need be.  We are right here with you. ? ? ?

## 2021-12-05 DIAGNOSIS — M9901 Segmental and somatic dysfunction of cervical region: Secondary | ICD-10-CM | POA: Diagnosis not present

## 2021-12-05 DIAGNOSIS — M9902 Segmental and somatic dysfunction of thoracic region: Secondary | ICD-10-CM | POA: Diagnosis not present

## 2021-12-05 DIAGNOSIS — M62830 Muscle spasm of back: Secondary | ICD-10-CM | POA: Diagnosis not present

## 2021-12-05 DIAGNOSIS — M25511 Pain in right shoulder: Secondary | ICD-10-CM | POA: Diagnosis not present

## 2021-12-08 DIAGNOSIS — M9901 Segmental and somatic dysfunction of cervical region: Secondary | ICD-10-CM | POA: Diagnosis not present

## 2021-12-08 DIAGNOSIS — M62830 Muscle spasm of back: Secondary | ICD-10-CM | POA: Diagnosis not present

## 2021-12-08 DIAGNOSIS — M9902 Segmental and somatic dysfunction of thoracic region: Secondary | ICD-10-CM | POA: Diagnosis not present

## 2021-12-08 DIAGNOSIS — M25511 Pain in right shoulder: Secondary | ICD-10-CM | POA: Diagnosis not present

## 2021-12-09 ENCOUNTER — Telehealth: Payer: Self-pay | Admitting: Family

## 2021-12-09 NOTE — Telephone Encounter (Signed)
Pt requesting a toc from Mickel Baas to Dr. Sharlet Salina ? ?Please advise ?

## 2021-12-12 NOTE — Telephone Encounter (Signed)
Fine

## 2021-12-13 ENCOUNTER — Ambulatory Visit
Admission: RE | Admit: 2021-12-13 | Discharge: 2021-12-13 | Disposition: A | Discharge status: Home / Self Care | Payer: BLUE CROSS/BLUE SHIELD | Source: Ambulatory Visit | Attending: Obstetrics and Gynecology | Admitting: Obstetrics and Gynecology

## 2021-12-13 ENCOUNTER — Other Ambulatory Visit: Payer: Self-pay | Admitting: Obstetrics and Gynecology

## 2021-12-13 DIAGNOSIS — R922 Inconclusive mammogram: Secondary | ICD-10-CM | POA: Diagnosis not present

## 2021-12-13 DIAGNOSIS — R928 Other abnormal and inconclusive findings on diagnostic imaging of breast: Secondary | ICD-10-CM

## 2021-12-15 DIAGNOSIS — M9902 Segmental and somatic dysfunction of thoracic region: Secondary | ICD-10-CM | POA: Diagnosis not present

## 2021-12-15 DIAGNOSIS — M62830 Muscle spasm of back: Secondary | ICD-10-CM | POA: Diagnosis not present

## 2021-12-15 DIAGNOSIS — M9901 Segmental and somatic dysfunction of cervical region: Secondary | ICD-10-CM | POA: Diagnosis not present

## 2021-12-15 DIAGNOSIS — M25511 Pain in right shoulder: Secondary | ICD-10-CM | POA: Diagnosis not present

## 2021-12-18 ENCOUNTER — Other Ambulatory Visit: Payer: Self-pay | Admitting: Diagnostic Radiology

## 2021-12-18 ENCOUNTER — Other Ambulatory Visit: Payer: Self-pay | Admitting: Obstetrics and Gynecology

## 2021-12-18 DIAGNOSIS — D259 Leiomyoma of uterus, unspecified: Secondary | ICD-10-CM

## 2022-01-02 DIAGNOSIS — M9903 Segmental and somatic dysfunction of lumbar region: Secondary | ICD-10-CM | POA: Diagnosis not present

## 2022-01-02 DIAGNOSIS — M9902 Segmental and somatic dysfunction of thoracic region: Secondary | ICD-10-CM | POA: Diagnosis not present

## 2022-01-02 DIAGNOSIS — M9905 Segmental and somatic dysfunction of pelvic region: Secondary | ICD-10-CM | POA: Diagnosis not present

## 2022-01-02 DIAGNOSIS — M9901 Segmental and somatic dysfunction of cervical region: Secondary | ICD-10-CM | POA: Diagnosis not present

## 2022-01-06 ENCOUNTER — Ambulatory Visit
Admission: RE | Admit: 2022-01-06 | Discharge: 2022-01-06 | Disposition: A | Discharge status: Home / Self Care | Payer: BC Managed Care – PPO | Source: Ambulatory Visit | Attending: Obstetrics and Gynecology | Admitting: Obstetrics and Gynecology

## 2022-01-06 DIAGNOSIS — N858 Other specified noninflammatory disorders of uterus: Secondary | ICD-10-CM | POA: Diagnosis not present

## 2022-01-06 DIAGNOSIS — D252 Subserosal leiomyoma of uterus: Secondary | ICD-10-CM | POA: Diagnosis not present

## 2022-01-06 DIAGNOSIS — D259 Leiomyoma of uterus, unspecified: Secondary | ICD-10-CM

## 2022-01-06 MED ORDER — GADOBENATE DIMEGLUMINE 529 MG/ML IV SOLN
15.0000 mL | Freq: Once | INTRAVENOUS | Status: AC | PRN
  Administered 2022-01-06: 15 mL via INTRAVENOUS

## 2022-01-08 DIAGNOSIS — M9905 Segmental and somatic dysfunction of pelvic region: Secondary | ICD-10-CM | POA: Diagnosis not present

## 2022-01-08 DIAGNOSIS — M62830 Muscle spasm of back: Secondary | ICD-10-CM | POA: Diagnosis not present

## 2022-01-08 DIAGNOSIS — M9902 Segmental and somatic dysfunction of thoracic region: Secondary | ICD-10-CM | POA: Diagnosis not present

## 2022-01-08 DIAGNOSIS — M9903 Segmental and somatic dysfunction of lumbar region: Secondary | ICD-10-CM | POA: Diagnosis not present

## 2022-01-08 DIAGNOSIS — M9901 Segmental and somatic dysfunction of cervical region: Secondary | ICD-10-CM | POA: Diagnosis not present

## 2022-01-14 DIAGNOSIS — M9901 Segmental and somatic dysfunction of cervical region: Secondary | ICD-10-CM | POA: Diagnosis not present

## 2022-01-14 DIAGNOSIS — M9903 Segmental and somatic dysfunction of lumbar region: Secondary | ICD-10-CM | POA: Diagnosis not present

## 2022-01-14 DIAGNOSIS — M9902 Segmental and somatic dysfunction of thoracic region: Secondary | ICD-10-CM | POA: Diagnosis not present

## 2022-01-14 DIAGNOSIS — M9905 Segmental and somatic dysfunction of pelvic region: Secondary | ICD-10-CM | POA: Diagnosis not present

## 2022-01-14 DIAGNOSIS — M62830 Muscle spasm of back: Secondary | ICD-10-CM | POA: Diagnosis not present

## 2022-01-19 ENCOUNTER — Encounter (INDEPENDENT_AMBULATORY_CARE_PROVIDER_SITE_OTHER): Payer: Self-pay | Admitting: Family Medicine

## 2022-01-19 ENCOUNTER — Ambulatory Visit (INDEPENDENT_AMBULATORY_CARE_PROVIDER_SITE_OTHER): Payer: BC Managed Care – PPO | Admitting: Family Medicine

## 2022-01-19 VITALS — BP 111/65 | HR 61 | Temp 98.0°F | Ht 66.0 in | Wt 165.0 lb

## 2022-01-19 DIAGNOSIS — E8881 Metabolic syndrome: Secondary | ICD-10-CM | POA: Diagnosis not present

## 2022-01-19 DIAGNOSIS — Z6826 Body mass index (BMI) 26.0-26.9, adult: Secondary | ICD-10-CM

## 2022-01-19 DIAGNOSIS — E7849 Other hyperlipidemia: Secondary | ICD-10-CM | POA: Diagnosis not present

## 2022-01-19 DIAGNOSIS — E559 Vitamin D deficiency, unspecified: Secondary | ICD-10-CM | POA: Diagnosis not present

## 2022-01-19 DIAGNOSIS — E669 Obesity, unspecified: Secondary | ICD-10-CM

## 2022-01-19 MED ORDER — VITAMIN D (ERGOCALCIFEROL) 1.25 MG (50000 UNIT) PO CAPS: 50000.0000 [IU] | ORAL_CAPSULE | ORAL | 0 refills | Status: DC

## 2022-01-19 NOTE — Patient Instructions (Signed)
The 10-year ASCVD risk score (Arnett DK, et al., 2019) is: 1%   Values used to calculate the score:     Age: 51 years     Sex: Female     Is Non-Hispanic African American: Yes     Diabetic: No     Tobacco smoker: No     Systolic Blood Pressure: 096 mmHg     Is BP treated: No     HDL Cholesterol: 77 mg/dL     Total Cholesterol: 194 mg/dL

## 2022-01-20 ENCOUNTER — Encounter: Payer: Self-pay | Admitting: *Deleted

## 2022-01-20 ENCOUNTER — Ambulatory Visit
Admission: RE | Admit: 2022-01-20 | Discharge: 2022-01-20 | Disposition: A | Discharge status: Home / Self Care | Payer: BC Managed Care – PPO | Source: Ambulatory Visit | Attending: Diagnostic Radiology | Admitting: Diagnostic Radiology

## 2022-01-20 DIAGNOSIS — M9905 Segmental and somatic dysfunction of pelvic region: Secondary | ICD-10-CM | POA: Diagnosis not present

## 2022-01-20 DIAGNOSIS — D259 Leiomyoma of uterus, unspecified: Secondary | ICD-10-CM | POA: Diagnosis not present

## 2022-01-20 DIAGNOSIS — M9901 Segmental and somatic dysfunction of cervical region: Secondary | ICD-10-CM | POA: Diagnosis not present

## 2022-01-20 DIAGNOSIS — N920 Excessive and frequent menstruation with regular cycle: Secondary | ICD-10-CM | POA: Diagnosis not present

## 2022-01-20 DIAGNOSIS — Z9889 Other specified postprocedural states: Secondary | ICD-10-CM | POA: Diagnosis not present

## 2022-01-20 DIAGNOSIS — M9902 Segmental and somatic dysfunction of thoracic region: Secondary | ICD-10-CM | POA: Diagnosis not present

## 2022-01-20 DIAGNOSIS — M9903 Segmental and somatic dysfunction of lumbar region: Secondary | ICD-10-CM | POA: Diagnosis not present

## 2022-01-20 HISTORY — PX: IR RADIOLOGIST EVAL & MGMT: IMG5224

## 2022-01-20 NOTE — Progress Notes (Signed)
Chief Complaint: Patient was consulted remotely today ([place]) for follow-up uterine artery embolization  Referring Physician(s): Cousins, Sheronette   History of Present Illness: Theresa Johnson is a 51 y.o. female with history of uterine fibroids and menorrhagia.  Patient had a bilateral uterine artery embolization procedure on 05/19/2021.  The procedure was technically successful without complications.  Patient presents for follow-up evaluation.  Patient was having some perimenopausal hot flashes prior to the procedure but she developed worsening hot flashes approximately 1 week after the procedure.  In addition, the patient has not had menstrual bleeding since the procedure.  Overall, patient is very happy with the procedure results.  Her main complaint is hot flashes related to menopause and she has been seeing Dr. Garwin Brothers for the symptoms.  Overall, the patient feels very good.  She has lost 70 pounds intentionally over the past year.  Patient had a follow-up MRI of the pelvis on 01/06/2022.  Past Medical History:  Diagnosis Date   Anxiety    Back pain    Joint pain    Lactose intolerance    Lymphedema    SOBOE (shortness of breath on exertion)    Swelling of both lower extremities    Vitamin D deficiency     Past Surgical History:  Procedure Laterality Date   BREAST BIOPSY     IR ANGIOGRAM PELVIS SELECTIVE OR SUPRASELECTIVE  05/19/2021   IR ANGIOGRAM PELVIS SELECTIVE OR SUPRASELECTIVE  05/19/2021   IR ANGIOGRAM SELECTIVE EACH ADDITIONAL VESSEL  05/19/2021   IR ANGIOGRAM SELECTIVE EACH ADDITIONAL VESSEL  05/19/2021   IR EMBO TUMOR ORGAN ISCHEMIA INFARCT INC GUIDE ROADMAPPING  05/19/2021   IR RADIOLOGIST EVAL & MGMT  02/13/2021   IR US GUIDE VASC ACCESS RIGHT  05/19/2021    Allergies: Lactose intolerance (gi)  Medications: Prior to Admission medications   Medication Sig Start Date End Date Taking? Authorizing Provider  Misc Natural Products (RELIZEN) TABS  Take 1 tablet by mouth daily.    [provider]  Multiple Vitamins-Minerals (MULTIVITAMIN WITH MINERALS) tablet Take 1 tablet by mouth daily.    [provider]  Vitamin D, Ergocalciferol, (DRISDOL) 1.25 MG (50000 UNIT) CAPS capsule Take 1 capsule (50,000 Units total) by mouth every 7 (seven) days. 01/19/22   Mellody Dance, DO     Family History  Problem Relation Age of Onset   Depression Mother    Arthritis Mother    Hyperlipidemia Mother    Hypertension Mother    Cancer Mother    Obesity Mother     Social History   Socioeconomic History   Marital status: Married    Spouse name: Tawnya Crook   Number of children: 0   Years of education: Not on file   Highest education level: Not on file  Occupational History   Occupation: Futures trader  Tobacco Use   Smoking status: Never   Smokeless tobacco: Never  Substance and Sexual Activity   Alcohol use: Not on file   Drug use: Not on file   Sexual activity: Not on file  Other Topics Concern   Not on file  Social History Narrative   Not on file   Social Determinants of Health   Financial Resource Strain: Not on file  Food Insecurity: Not on file  Transportation Needs: Not on file  Physical Activity: Not on file  Stress: Not on file  Social Connections: Not on file    ECOG Status: 0 - Asymptomatic  Review of Systems  Constitutional: Negative.   Genitourinary: Negative.      Physical Exam No direct physical exam was performed  Vital Signs: There were no vitals taken for this visit.  Imaging: MR PELVIS W WO CONTRAST  Result Date: 01/07/2022 CLINICAL DATA:  Follow-up uterine fibroids. 7 months status post uterine artery embolization. EXAM: MRI PELVIS WITHOUT AND WITH CONTRAST TECHNIQUE: Multiplanar multisequence MR imaging of the pelvis was performed both before and after administration of intravenous contrast. CONTRAST:  65m MULTIHANCE GADOBENATE DIMEGLUMINE 529 MG/ML IV SOLN COM[facility]   [facility] FINDINGS: Lower Urinary Tract: No urinary bladder or urethral abnormality identified. Bowel: Unremarkable pelvic bowel loops. Vascular/Lymphatic: Unremarkable. No pathologically enlarged pelvic lymph nodes identified. Reproductive: -- Uterus: Measures 8.0 by 5.6 by 7.8 cm (volume = 180 cm^3). This represents a decrease from 11.4 by 8.9 by 8.0 cm (volume = 420 cm^3) when remeasured in same planes on prior study. 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 Multiple uterine fibroids are again seen which are submucosal, intramural, and subserosal in location but have decreased in size since previous study. Most of these fibroids show no residual contrast enhancement. A subserosal fibroid arising from the left uterine fundus measuring 5.4 x 4.3 cm shows areas of internal degeneration, but shows residual enhancement of approximately 30% of its volume. A smaller subserosal fibroid in the left anterior corpus measuring 3.1 x 2.8 cm shows diffuse residual contrast enhancement. No new or enlarging uterine masses identified. -- Right ovary: Appears normal. No ovarian or adnexal masses identified. -- Left ovary:  Not visualized, however no adnexal mass identified. Other: Trace amount of free fluid noted. Musculoskeletal:  Unremarkable. 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 Two left-sided uterine fibroids show residual contrast enhancement, as described above. No adnexal mass identified. Electronically Signed   By: JMarlaine HindM.D.   On: 01/07/2022 10:20    Labs:  CBC: Recent Labs    05/19/21 0802  WBC 8.4  HGB 13.4  HCT 40.1  PLT 244    COAGS: Recent Labs    05/19/21 0802  INR 1.0    BMP: Recent Labs    05/19/21 0817  NA 139  K 3.6  CL 111  CO2 23  GLUCOSE 95  BUN 17   CALCIUM 8.7*  CREATININE 0.74  GFRNONAA >60    LIVER FUNCTION TESTS: No results for input(s): "BILITOT", "AST", "ALT", "ALKPHOS", "PROT", "ALBUMIN" in the last 8760 hours.  TUMOR MARKERS: No results for input(s): "AFPTM", "CEA", "CA199", "CHROMGRNA" in the last 8760 hours.  Assessment and Plan:  51year old with history of uterine fibroids and menorrhagia.  Patient underwent bilateral uterine artery embolization on 05/19/2021.  The patient appeared to be perimenopausal prior to the procedure but the patient now appears to be menopausal following the embolization.  She has not had a menstrual period since the procedure.  She notes that the hot flushing has gotten worse since her procedure.  However, patient is very happy with the treatment results because her main complaint was the heavy bleeding.  We reviewed the results of the MRI which are compatible with postembolization findings.  Overall size of the uterus and the fibroids have decreased and resolution of the anterior uterine adenomyoma.  There is residual enhancement within the two fibroids but this is probably not clinically significant since she is no longer having menstrual  periods and likely entering menopause.  Patient will follow-up with Dr. Garwin Brothers and she can follow-up with interventional radiology as needed.   Thank you for this interesting consult.  I greatly enjoyed meeting Theresa Johnson and look forward to participating in their care.  A copy of this report was sent to the requesting provider on this date.  Electronically Signed: Burman Riis 01/20/2022, 3:29 PM   I spent a total of    15 Minutes in remote  clinical consultation, greater than 50% of which was counseling/coordinating care for uterine fibroids and menorrhagia.    Visit type: Audio only (telephone). Audio (no video) only due to patient preference. Alternative for in-person consultation at [facility], [address] [place], [place], [place]. This  visit type was conducted due to national recommendations for restrictions regarding the COVID-19 Pandemic (e.g. social distancing).  This format is felt to be most appropriate for this patient at this time.  All issues noted in this document were discussed and addressed.   Patient ID: Theresa Johnson, female   DOB: April 03, 1971, 51 y.o.   MRN: [id number]

## 2022-01-22 NOTE — Progress Notes (Unsigned)
Chief Complaint:   OBESITY Theresa Johnson is here to discuss her progress with her obesity treatment plan along with follow-up of her obesity related diagnoses. Theresa Johnson is on the Category 3 Plan and states she is following her eating plan approximately 75% of the time. Theresa Johnson states she is doing Pilates 55 minutes 5-6 times per week.  Today's visit was #: 27 Starting weight: 227 lbs Starting date: 10/03/2020 Today's weight: 165 lbs Today's date: 01/19/2022 Total lbs lost to date: 62 Total lbs lost since last in-office visit: +3  Interim History: Theresa Johnson was away in [place] for 2 weeks and ate out a lot. She also ate more over the weekend to celebrate her nephew's graduation.  Subjective:   1. Vitamin D deficiency Discussed labs with patient today. She is currently taking prescription vitamin D 50,000 IU each week. She denies nausea, vomiting or muscle weakness.  Lab Results  Component Value Date   VD25OH 49.1 11/24/2021   VD25OH 50.8 07/31/2021   VD25OH 58.4 02/04/2021   2. Insulin resistance Discussed labs with patient today. Goal is HgbA1c < 5.7, fasting insulin closer to 5.    Lab Results  Component Value Date   HGBA1C 5.3 11/24/2021   Lab Results  Component Value Date   INSULIN 3.1 11/24/2021   INSULIN 3.3 07/31/2021   INSULIN 6.1 02/04/2021   INSULIN 5.5 10/03/2020    3. Other hyperlipidemia Discussed labs with patient today. Theresa Johnson has hyperlipidemia and has been trying to improve her cholesterol levels with intensive lifestyle modifications including a low saturated fat diet, exercise, and weight loss. She denies any chest pain, claudication, or myalgias. Medication: None  Lab Results  Component Value Date   ALT 27 10/03/2020   AST 33 10/03/2020   ALKPHOS 99 10/03/2020   BILITOT 0.6 10/03/2020   Lab Results  Component Value Date   CHOL 194 11/24/2021   HDL 77 11/24/2021   LDLCALC 106 (H) 11/24/2021   TRIG 61 11/24/2021   CHOLHDL 2.5 11/24/2021      Assessment/Plan:  No orders of the defined types were placed in this encounter.   Medications Discontinued During This Encounter  Medication Reason   Vitamin D, Ergocalciferol, (DRISDOL) 1.25 MG (50000 UNIT) CAPS capsule Reorder     Meds ordered this encounter  Medications   Vitamin D, Ergocalciferol, (DRISDOL) 1.25 MG (50000 UNIT) CAPS capsule    Sig: Take 1 capsule (50,000 Units total) by mouth every 7 (seven) days.    Dispense:  9 capsule    Refill:  0    60 d supply;  ** OV for RF **   Do not send RF request     1. Vitamin D deficiency Vit D at goal. Low Vitamin D level contributes to fatigue and are associated with obesity, breast, and colon cancer. She agrees to continue to take prescription Vitamin D '@50'$ ,000 IU every week and will follow-up for routine testing of Vitamin D, at least 2-3 times per year to avoid over-replacement.  Refill- Vitamin D, Ergocalciferol, (DRISDOL) 1.25 MG (50000 UNIT) CAPS capsule; Take 1 capsule (50,000 Units total) by mouth every 7 (seven) days.  Dispense: 9 capsule; Refill: 0  2. Insulin resistance Fasting insulin at goal at 3.1. Improved from prior labs. Theresa Johnson will continue to work on weight loss, exercise, and decreasing simple carbohydrates to help decrease the risk of diabetes. Theresa Johnson agreed to follow-up with [place] as directed to closely monitor her progress.  3. Other hyperlipidemia Improved LDL and HDL  is now over 36, which is great! Cardiovascular risk and specific lipid/LDL goals reviewed.  We discussed several lifestyle modifications today and Theresa Johnson will continue to work on diet, exercise and weight loss efforts. Orders and follow up as documented in patient record.   Counseling Intensive lifestyle modifications are the first line treatment for this issue. Dietary changes: Increase soluble fiber. Decrease simple carbohydrates. Exercise changes: Moderate to vigorous-intensity aerobic activity 150 minutes per week if  tolerated. Lipid-lowering medications: see documented in medical record.  4. Obesity with current BMI of 26.7 Theresa Johnson is currently in the action stage of change. As such, her goal is to continue with weight loss efforts. She has agreed to the Category 3 Plan.   Exercise goals:  As is  Behavioral modification strategies: increasing lean protein intake, decreasing simple carbohydrates, and planning for success.  Theresa Johnson has agreed to follow-up with our clinic in 8 weeks. She was informed of the importance of frequent follow-up visits to maximize her success with intensive lifestyle modifications for her multiple health conditions.   Objective:   Blood pressure 111/65, pulse 61, temperature 98 F (36.7 C), height '5\' 6"'$  (1.676 m), weight 165 lb (74.8 kg), SpO2 98 %. Body mass index is 26.63 kg/m.  General: Cooperative, alert, well developed, in no acute distress. HEENT: Conjunctivae and lids unremarkable. Cardiovascular: Regular rhythm.  Lungs: Normal work of breathing. Neurologic: No focal deficits.   Lab Results  Component Value Date   CREATININE 0.74 05/19/2021   BUN 17 05/19/2021   NA 139 05/19/2021   K 3.6 05/19/2021   CL 111 05/19/2021   CO2 23 05/19/2021   Lab Results  Component Value Date   ALT 27 10/03/2020   AST 33 10/03/2020   ALKPHOS 99 10/03/2020   BILITOT 0.6 10/03/2020   Lab Results  Component Value Date   HGBA1C 5.3 11/24/2021   HGBA1C 5.5 07/31/2021   HGBA1C 5.4 10/03/2020   Lab Results  Component Value Date   INSULIN 3.1 11/24/2021   INSULIN 3.3 07/31/2021   INSULIN 6.1 02/04/2021   INSULIN 5.5 10/03/2020   Lab Results  Component Value Date   TSH 1.590 10/03/2020   Lab Results  Component Value Date   CHOL 194 11/24/2021   HDL 77 11/24/2021   LDLCALC 106 (H) 11/24/2021   TRIG 61 11/24/2021   CHOLHDL 2.5 11/24/2021   Lab Results  Component Value Date   VD25OH 49.1 11/24/2021   VD25OH 50.8 07/31/2021   VD25OH 58.4 02/04/2021   Lab  Results  Component Value Date   WBC 8.4 05/19/2021   HGB 13.4 05/19/2021   HCT 40.1 05/19/2021   MCV 101.0 (H) 05/19/2021   PLT 244 05/19/2021   Lab Results  Component Value Date   FERRITIN 234.7 07/03/2020    Attestation Statements:   Reviewed by clinician on day of visit: allergies, medications, problem list, medical history, surgical history, family history, social history, and previous encounter notes.  I, Kathlene November, BS, CMA, am acting as transcriptionist for [employer], DO.  I have reviewed the above documentation for accuracy and completeness, and I agree with the above. Marjory Sneddon, D.O.  The [place] was signed into law in 2016 which includes the topic of electronic health records.  This provides immediate access to information in MyChart.  This includes consultation notes, operative notes, office notes, lab results and pathology reports.  If you have any questions about what you read please let us know  at your next visit so we can discuss your concerns and take corrective action if need be.  We are right here with you.

## 2022-02-03 DIAGNOSIS — M9905 Segmental and somatic dysfunction of pelvic region: Secondary | ICD-10-CM | POA: Diagnosis not present

## 2022-02-03 DIAGNOSIS — M9901 Segmental and somatic dysfunction of cervical region: Secondary | ICD-10-CM | POA: Diagnosis not present

## 2022-02-03 DIAGNOSIS — M9902 Segmental and somatic dysfunction of thoracic region: Secondary | ICD-10-CM | POA: Diagnosis not present

## 2022-02-03 DIAGNOSIS — M62830 Muscle spasm of back: Secondary | ICD-10-CM | POA: Diagnosis not present

## 2022-02-03 DIAGNOSIS — M9903 Segmental and somatic dysfunction of lumbar region: Secondary | ICD-10-CM | POA: Diagnosis not present

## 2022-02-11 DIAGNOSIS — M9902 Segmental and somatic dysfunction of thoracic region: Secondary | ICD-10-CM | POA: Diagnosis not present

## 2022-02-11 DIAGNOSIS — M9901 Segmental and somatic dysfunction of cervical region: Secondary | ICD-10-CM | POA: Diagnosis not present

## 2022-02-11 DIAGNOSIS — M9905 Segmental and somatic dysfunction of pelvic region: Secondary | ICD-10-CM | POA: Diagnosis not present

## 2022-02-11 DIAGNOSIS — M9903 Segmental and somatic dysfunction of lumbar region: Secondary | ICD-10-CM | POA: Diagnosis not present

## 2022-02-24 DIAGNOSIS — M9903 Segmental and somatic dysfunction of lumbar region: Secondary | ICD-10-CM | POA: Diagnosis not present

## 2022-02-24 DIAGNOSIS — M9905 Segmental and somatic dysfunction of pelvic region: Secondary | ICD-10-CM | POA: Diagnosis not present

## 2022-02-24 DIAGNOSIS — M62830 Muscle spasm of back: Secondary | ICD-10-CM | POA: Diagnosis not present

## 2022-02-24 DIAGNOSIS — M9902 Segmental and somatic dysfunction of thoracic region: Secondary | ICD-10-CM | POA: Diagnosis not present

## 2022-02-24 DIAGNOSIS — M9901 Segmental and somatic dysfunction of cervical region: Secondary | ICD-10-CM | POA: Diagnosis not present

## 2022-03-03 DIAGNOSIS — N951 Menopausal and female climacteric states: Secondary | ICD-10-CM | POA: Diagnosis not present

## 2022-03-06 DIAGNOSIS — M9905 Segmental and somatic dysfunction of pelvic region: Secondary | ICD-10-CM | POA: Diagnosis not present

## 2022-03-06 DIAGNOSIS — M9901 Segmental and somatic dysfunction of cervical region: Secondary | ICD-10-CM | POA: Diagnosis not present

## 2022-03-06 DIAGNOSIS — M9903 Segmental and somatic dysfunction of lumbar region: Secondary | ICD-10-CM | POA: Diagnosis not present

## 2022-03-06 DIAGNOSIS — M9902 Segmental and somatic dysfunction of thoracic region: Secondary | ICD-10-CM | POA: Diagnosis not present

## 2022-03-13 DIAGNOSIS — M9901 Segmental and somatic dysfunction of cervical region: Secondary | ICD-10-CM | POA: Diagnosis not present

## 2022-03-13 DIAGNOSIS — M9903 Segmental and somatic dysfunction of lumbar region: Secondary | ICD-10-CM | POA: Diagnosis not present

## 2022-03-13 DIAGNOSIS — M9902 Segmental and somatic dysfunction of thoracic region: Secondary | ICD-10-CM | POA: Diagnosis not present

## 2022-03-13 DIAGNOSIS — M62411 Contracture of muscle, right shoulder: Secondary | ICD-10-CM | POA: Diagnosis not present

## 2022-03-17 ENCOUNTER — Ambulatory Visit (INDEPENDENT_AMBULATORY_CARE_PROVIDER_SITE_OTHER): Payer: BC Managed Care – PPO | Admitting: Family Medicine

## 2022-03-18 ENCOUNTER — Encounter (INDEPENDENT_AMBULATORY_CARE_PROVIDER_SITE_OTHER): Payer: Self-pay

## 2022-04-06 DIAGNOSIS — N951 Menopausal and female climacteric states: Secondary | ICD-10-CM | POA: Diagnosis not present

## 2022-04-06 DIAGNOSIS — Z9889 Other specified postprocedural states: Secondary | ICD-10-CM | POA: Diagnosis not present

## 2022-04-06 DIAGNOSIS — R87610 Atypical squamous cells of undetermined significance on cytologic smear of cervix (ASC-US): Secondary | ICD-10-CM | POA: Diagnosis not present

## 2022-04-06 DIAGNOSIS — Z01419 Encounter for gynecological examination (general) (routine) without abnormal findings: Secondary | ICD-10-CM | POA: Diagnosis not present

## 2022-04-08 ENCOUNTER — Encounter (INDEPENDENT_AMBULATORY_CARE_PROVIDER_SITE_OTHER): Payer: Self-pay | Admitting: Family Medicine

## 2022-04-08 ENCOUNTER — Ambulatory Visit (INDEPENDENT_AMBULATORY_CARE_PROVIDER_SITE_OTHER): Payer: BC Managed Care – PPO | Admitting: Family Medicine

## 2022-04-08 VITALS — BP 124/84 | HR 72 | Temp 98.3°F | Ht 66.0 in | Wt 165.0 lb

## 2022-04-08 DIAGNOSIS — E559 Vitamin D deficiency, unspecified: Secondary | ICD-10-CM | POA: Diagnosis not present

## 2022-04-08 DIAGNOSIS — E7849 Other hyperlipidemia: Secondary | ICD-10-CM | POA: Diagnosis not present

## 2022-04-08 DIAGNOSIS — E669 Obesity, unspecified: Secondary | ICD-10-CM

## 2022-04-08 DIAGNOSIS — E8881 Metabolic syndrome: Secondary | ICD-10-CM | POA: Diagnosis not present

## 2022-04-08 DIAGNOSIS — Z6826 Body mass index (BMI) 26.0-26.9, adult: Secondary | ICD-10-CM

## 2022-04-08 MED ORDER — VITAMIN D (ERGOCALCIFEROL) 1.25 MG (50000 UNIT) PO CAPS: 50000.0000 [IU] | ORAL_CAPSULE | ORAL | 0 refills | Status: DC

## 2022-04-17 NOTE — Progress Notes (Unsigned)
Chief Complaint:   OBESITY Theresa Johnson is here to discuss her progress with her obesity treatment plan along with follow-up of her obesity related diagnoses. Theresa Johnson is on the Category 3 Plan and states she is following her eating plan approximately 80% of the time. Theresa Johnson states she is doing Pilates and walking 3 miles 55 minutes 4-7 times per week.  Today's visit was #: 20 Starting weight: 227 lbs Starting date: 10/03/2020 Today's weight: 165 lbs Today's date: 04/08/2022 Total lbs lost to date: 62 Total lbs lost since last in-office visit: 0  Interim History: Theresa Johnson went to [place] to see family and friends and ate off plan. Upon returning to [facility], she got right back on plan and returned to her workouts. Pt gained muscle mass and lost fat mass and visibly looks more fit. She is doing great.  Subjective:   1. Insulin resistance Goal is HgbA1c < 5.7, fasting insulin closer to 5.    2. Other hyperlipidemia Theresa Johnson has hyperlipidemia and has been trying to improve her cholesterol levels with intensive lifestyle modifications including a low saturated fat diet, exercise, and weight loss. She denies any chest pain, claudication, or myalgias.  3. Vitamin D deficiency She is currently taking prescription vitamin D 50,000 IU each week. She denies nausea, vomiting or muscle weakness.  Assessment/Plan:  No orders of the defined types were placed in this encounter.   Medications Discontinued During This Encounter  Medication Reason   Vitamin D, Ergocalciferol, (DRISDOL) 1.25 MG (50000 UNIT) CAPS capsule Reorder     Meds ordered this encounter  Medications   Vitamin D, Ergocalciferol, (DRISDOL) 1.25 MG (50000 UNIT) CAPS capsule    Sig: Take 1 capsule (50,000 Units total) by mouth every 7 (seven) days.    Dispense:  9 capsule    Refill:  0    60 d supply;  ** OV for RF **   Do not send RF request     1. Insulin resistance Theresa Johnson will continue to work on weight loss, exercise, and  decreasing simple carbohydrates to help decrease the risk of diabetes. Theresa Johnson agreed to follow-up with [place] as directed to closely monitor her progress. Check fasting labs at next OV.  2. Other hyperlipidemia Cardiovascular risk and specific lipid/LDL goals reviewed.  We discussed several lifestyle modifications today and Kelcie will continue to work on diet, exercise and weight loss efforts. Orders and follow up as documented in patient record. Check fasting labs at next OV.  Counseling Intensive lifestyle modifications are the first line treatment for this issue. Dietary changes: Increase soluble fiber. Decrease simple carbohydrates. Exercise changes: Moderate to vigorous-intensity aerobic activity 150 minutes per week if tolerated. Lipid-lowering medications: see documented in medical record.  3. Vitamin D deficiency Low Vitamin D level contributes to fatigue and are associated with obesity, breast, and colon cancer. She agrees to continue to take prescription Vitamin D '@50'$ ,000 IU every week and will follow-up for routine testing of Vitamin D, at least 2-3 times per year to avoid over-replacement. Check fasting labs at next OV.  Refill- Vitamin D, Ergocalciferol, (DRISDOL) 1.25 MG (50000 UNIT) CAPS capsule; Take 1 capsule (50,000 Units total) by mouth every 7 (seven) days.  Dispense: 9 capsule; Refill: 0  4. Obesity with current BMI of 26.7 Theresa Johnson is currently in the action stage of change. As such, her goal is to continue with weight loss efforts. She has agreed to the Category 3 Plan.   Discussion with pt regarding how to maintain  weight and continue to increase strength training as tolerated.  Exercise goals:  As is  Behavioral modification strategies: dealing with family or coworker sabotage and travel eating strategies.  Theresa Johnson has agreed to follow-up with our clinic in 8 weeks, 30 minutes prior to appt for repeat IC and fasting blood work. She was informed of the importance of  frequent follow-up visits to maximize her success with intensive lifestyle modifications for her multiple health conditions.   Objective:   Blood pressure 124/84, pulse 72, temperature 98.3 F (36.8 C), height '5\' 6"'$  (1.676 m), weight 165 lb (74.8 kg), SpO2 98 %. Body mass index is 26.63 kg/m.  General: Cooperative, alert, well developed, in no acute distress. HEENT: Conjunctivae and lids unremarkable. Cardiovascular: Regular rhythm.  Lungs: Normal work of breathing. Neurologic: No focal deficits.   Lab Results  Component Value Date   CREATININE 0.74 05/19/2021   BUN 17 05/19/2021   NA 139 05/19/2021   K 3.6 05/19/2021   CL 111 05/19/2021   CO2 23 05/19/2021   Lab Results  Component Value Date   ALT 27 10/03/2020   AST 33 10/03/2020   ALKPHOS 99 10/03/2020   BILITOT 0.6 10/03/2020   Lab Results  Component Value Date   HGBA1C 5.3 11/24/2021   HGBA1C 5.5 07/31/2021   HGBA1C 5.4 10/03/2020   Lab Results  Component Value Date   INSULIN 3.1 11/24/2021   INSULIN 3.3 07/31/2021   INSULIN 6.1 02/04/2021   INSULIN 5.5 10/03/2020   Lab Results  Component Value Date   TSH 1.590 10/03/2020   Lab Results  Component Value Date   CHOL 194 11/24/2021   HDL 77 11/24/2021   LDLCALC 106 (H) 11/24/2021   TRIG 61 11/24/2021   CHOLHDL 2.5 11/24/2021   Lab Results  Component Value Date   VD25OH 49.1 11/24/2021   VD25OH 50.8 07/31/2021   VD25OH 58.4 02/04/2021   Lab Results  Component Value Date   WBC 8.4 05/19/2021   HGB 13.4 05/19/2021   HCT 40.1 05/19/2021   MCV 101.0 (H) 05/19/2021   PLT 244 05/19/2021   Lab Results  Component Value Date   FERRITIN 234.7 07/03/2020   Attestation Statements:   Reviewed by clinician on day of visit: allergies, medications, problem list, medical history, surgical history, family history, social history, and previous encounter notes.  I, Kathlene November, BS, CMA, am acting as transcriptionist for [employer], DO.   I have  reviewed the above documentation for accuracy and completeness, and I agree with the above. Marjory Sneddon, D.O.  The [place] was signed into law in 2016 which includes the topic of electronic health records.  This provides immediate access to information in MyChart.  This includes consultation notes, operative notes, office notes, lab results and pathology reports.  If you have any questions about what you read please let us know at your next visit so we can discuss your concerns and take corrective action if need be.  We are right here with you.

## 2022-06-03 ENCOUNTER — Ambulatory Visit (INDEPENDENT_AMBULATORY_CARE_PROVIDER_SITE_OTHER): Payer: BC Managed Care – PPO | Admitting: Family Medicine

## 2022-06-03 ENCOUNTER — Encounter (INDEPENDENT_AMBULATORY_CARE_PROVIDER_SITE_OTHER): Payer: Self-pay | Admitting: Family Medicine

## 2022-06-03 VITALS — BP 121/69 | HR 60 | Temp 98.1°F | Ht 66.0 in | Wt 171.0 lb

## 2022-06-03 DIAGNOSIS — Z6836 Body mass index (BMI) 36.0-36.9, adult: Secondary | ICD-10-CM | POA: Diagnosis not present

## 2022-06-03 DIAGNOSIS — Z6827 Body mass index (BMI) 27.0-27.9, adult: Secondary | ICD-10-CM

## 2022-06-03 DIAGNOSIS — F4320 Adjustment disorder, unspecified: Secondary | ICD-10-CM | POA: Diagnosis not present

## 2022-06-03 DIAGNOSIS — E88819 Insulin resistance, unspecified: Secondary | ICD-10-CM | POA: Diagnosis not present

## 2022-06-03 DIAGNOSIS — R5383 Other fatigue: Secondary | ICD-10-CM | POA: Diagnosis not present

## 2022-06-03 DIAGNOSIS — E559 Vitamin D deficiency, unspecified: Secondary | ICD-10-CM | POA: Diagnosis not present

## 2022-06-03 DIAGNOSIS — E669 Obesity, unspecified: Secondary | ICD-10-CM

## 2022-06-03 DIAGNOSIS — R0602 Shortness of breath: Secondary | ICD-10-CM

## 2022-06-03 DIAGNOSIS — E7849 Other hyperlipidemia: Secondary | ICD-10-CM

## 2022-06-03 MED ORDER — VITAMIN D (ERGOCALCIFEROL) 1.25 MG (50000 UNIT) PO CAPS: 50000.0000 [IU] | ORAL_CAPSULE | ORAL | 0 refills | Status: DC

## 2022-06-03 NOTE — Telephone Encounter (Signed)
Please review

## 2022-06-04 LAB — TSH: TSH: 1.38 u[IU]/mL (ref 0.450–4.500)

## 2022-06-04 LAB — CBC WITH DIFFERENTIAL/PLATELET
Basophils Absolute: 0 10*3/uL (ref 0.0–0.2)
Basos: 0 %
EOS (ABSOLUTE): 0.1 10*3/uL (ref 0.0–0.4)
Eos: 1 %
Hematocrit: 41.7 % (ref 34.0–46.6)
Hemoglobin: 13.4 g/dL (ref 11.1–15.9)
Immature Grans (Abs): 0 10*3/uL (ref 0.0–0.1)
Immature Granulocytes: 0 %
Lymphocytes Absolute: 1.5 10*3/uL (ref 0.7–3.1)
Lymphs: 30 %
MCH: 32.7 pg (ref 26.6–33.0)
MCHC: 32.1 g/dL (ref 31.5–35.7)
MCV: 102 fL — ABNORMAL HIGH (ref 79–97)
Monocytes Absolute: 0.5 10*3/uL (ref 0.1–0.9)
Monocytes: 10 %
Neutrophils Absolute: 2.9 10*3/uL (ref 1.4–7.0)
Neutrophils: 59 %
Platelets: 195 10*3/uL (ref 150–450)
RBC: 4.1 x10E6/uL (ref 3.77–5.28)
RDW: 12 % (ref 11.7–15.4)
WBC: 5.1 10*3/uL (ref 3.4–10.8)

## 2022-06-04 LAB — COMPREHENSIVE METABOLIC PANEL
ALT: 21 IU/L (ref 0–32)
AST: 23 IU/L (ref 0–40)
Albumin/Globulin Ratio: 1.4 (ref 1.2–2.2)
Albumin: 4.1 g/dL (ref 3.8–4.9)
Alkaline Phosphatase: 99 IU/L (ref 44–121)
BUN/Creatinine Ratio: 20 (ref 9–23)
BUN: 15 mg/dL (ref 6–24)
Bilirubin Total: 0.6 mg/dL (ref 0.0–1.2)
CO2: 26 mmol/L (ref 20–29)
Calcium: 9 mg/dL (ref 8.7–10.2)
Chloride: 104 mmol/L (ref 96–106)
Creatinine, Ser: 0.76 mg/dL (ref 0.57–1.00)
Globulin, Total: 2.9 g/dL (ref 1.5–4.5)
Glucose: 84 mg/dL (ref 70–99)
Potassium: 4.2 mmol/L (ref 3.5–5.2)
Sodium: 140 mmol/L (ref 134–144)
Total Protein: 7 g/dL (ref 6.0–8.5)
eGFR: 94 mL/min/{1.73_m2} (ref >59)

## 2022-06-04 LAB — VITAMIN B12: Vitamin B-12: 562 pg/mL (ref 232–1245)

## 2022-06-04 LAB — LIPID PANEL WITH LDL/HDL RATIO
Cholesterol, Total: 194 mg/dL (ref 100–199)
HDL: 72 mg/dL (ref >39)
LDL Chol Calc (NIH): 110 mg/dL — ABNORMAL HIGH (ref 0–99)
LDL/HDL Ratio: 1.5 ratio (ref 0.0–3.2)
Triglycerides: 67 mg/dL (ref 0–149)
VLDL Cholesterol Cal: 12 mg/dL (ref 5–40)

## 2022-06-04 LAB — INSULIN, RANDOM: INSULIN: 4.8 u[IU]/mL (ref 2.6–24.9)

## 2022-06-04 LAB — HEMOGLOBIN A1C
Est. average glucose Bld gHb Est-mCnc: 103 mg/dL
Hgb A1c MFr Bld: 5.2 % (ref 4.8–5.6)

## 2022-06-04 LAB — FOLATE: Folate: 13.5 ng/mL (ref >3.0)

## 2022-06-04 LAB — T4, FREE: Free T4: 1.12 ng/dL (ref 0.82–1.77)

## 2022-06-04 LAB — VITAMIN D 25 HYDROXY (VIT D DEFICIENCY, FRACTURES): Vit D, 25-Hydroxy: 55.1 ng/mL (ref 30.0–100.0)

## 2022-06-08 DIAGNOSIS — F4320 Adjustment disorder, unspecified: Secondary | ICD-10-CM | POA: Insufficient documentation

## 2022-06-08 MED ORDER — METFORMIN HCL 500 MG PO TABS: ORAL_TABLET | ORAL | 0 refills | Status: DC

## 2022-06-11 ENCOUNTER — Ambulatory Visit: Payer: BC Managed Care – PPO | Admitting: Internal Medicine

## 2022-06-11 ENCOUNTER — Encounter: Payer: Self-pay | Admitting: Internal Medicine

## 2022-06-11 VITALS — BP 118/68 | HR 72 | Temp 98.3°F | Ht 66.0 in | Wt 171.0 lb

## 2022-06-11 DIAGNOSIS — Z0001 Encounter for general adult medical examination with abnormal findings: Secondary | ICD-10-CM | POA: Diagnosis not present

## 2022-06-11 DIAGNOSIS — I872 Venous insufficiency (chronic) (peripheral): Secondary | ICD-10-CM | POA: Diagnosis not present

## 2022-06-11 DIAGNOSIS — E559 Vitamin D deficiency, unspecified: Secondary | ICD-10-CM

## 2022-06-11 NOTE — Progress Notes (Signed)
   Subjective:   Patient ID: Theresa Johnson, female    DOB: [birth year], 51 y.o.   MRN: [id number]  HPI The patient is a TOC coming for physical.  PMH, [place], social history reviewed and updated  Review of Systems  Constitutional: Negative.   HENT: Negative.    Eyes: Negative.   Respiratory:  Negative for cough, chest tightness and shortness of breath.   Cardiovascular:  Negative for chest pain, palpitations and leg swelling.  Gastrointestinal:  Negative for abdominal distention, abdominal pain, constipation, diarrhea, nausea and vomiting.  Musculoskeletal: Negative.   Skin: Negative.   Neurological: Negative.   Psychiatric/Behavioral: Negative.      Objective:  Physical Exam Constitutional:      Appearance: She is well-developed.  HENT:     Head: Normocephalic and atraumatic.  Cardiovascular:     Rate and Rhythm: Normal rate and regular rhythm.  Pulmonary:     Effort: Pulmonary effort is normal. No respiratory distress.     Breath sounds: Normal breath sounds. No wheezing or rales.  Abdominal:     General: Bowel sounds are normal. There is no distension.     Palpations: Abdomen is soft.     Tenderness: There is no abdominal tenderness. There is no rebound.  Musculoskeletal:     Cervical back: Normal range of motion.  Skin:    General: Skin is warm and dry.  Neurological:     Mental Status: She is alert and oriented to person, place, and time.     Coordination: Coordination normal.     Vitals:   06/11/22 1104  BP: 118/68  Pulse: 72  Temp: 98.3 F (36.8 C)  TempSrc: Oral  SpO2: 93%  Weight: 171 lb (77.6 kg)  Height: '5\' 6"'$  (1.676 m)    Assessment & Plan:

## 2022-06-11 NOTE — Patient Instructions (Addendum)
You can try cortisone on a qtip 2-3 times a week to help the itching.

## 2022-06-12 NOTE — Assessment & Plan Note (Signed)
Taking high dose 50000 units weekly vitamin D at this time.

## 2022-06-12 NOTE — Assessment & Plan Note (Signed)
Uses compression stockings with traveling.

## 2022-06-12 NOTE — Assessment & Plan Note (Signed)
Flu shot complete. Covid-19 counseled. Shingrix complete. Tetanus up to date. Colonoscopy up to date. Mammogram up to date, pap smear up to date. Counseled about sun safety and mole surveillance. Counseled about the dangers of distracted driving. Given 10 year screening recommendations.

## 2022-06-16 NOTE — Progress Notes (Signed)
Chief Complaint:   OBESITY Theresa Johnson is here to discuss her progress with her obesity treatment plan along with follow-up of her obesity related diagnoses. Theresa Johnson is on the Category 3 Plan and states she is following her eating plan approximately 70% of the time. Theresa Johnson states she is doing pilates for 60 minutes 5 times per week.  Today's visit was #: 21 Starting weight: 227 lbs Starting date: 10/03/2020 Today's weight: 171 lbs Today's date: 06/03/2022 Total lbs lost to date: 56 Total lbs lost since last in-office visit: 0  Interim History: Patient has terrible cravings for potato chips or carbohydrates and sweets. She notes more stress over her mom's driving and she doesn't need to be. She notes anxiety and anxiousness "all of the time", she is not quite as active either.   Subjective:   1. Adjustment disorder, with anxiety d/t caregiver stress Patient's GYN started her on mood medications in the past; she is unsure which one is for menopausal vasomotor symptoms. She never started it. She can find out which one.   2. Insulin resistance Patient note cravings. She feels "out of control" at times and she is very worried about her weight gain today of 6 lbs. I had a long discussion with the patient on possible medications, risks and benefits of each to help with her symptoms.   3. Vitamin D deficiency Theresa Johnson is tolerating medication(s) well without side effects.  Medication compliance is good as patient endorses taking it as prescribed.  The patient denies additional concerns regarding this condition.      4. SOB (shortness of breath) on exertion Patient's repeat IC today showed a measure at 1699; prior in February 2022 it was 2002 but she lost 60 lbs since then. Calculated today 1526.   5. Other hyperlipidemia Theresa Johnson has hyperlipidemia and she is not on medications. She has been trying to improve her cholesterol levels with intensive lifestyle modifications  including a low saturated fat diet, exercise, and weight loss. She denies any chest pain, claudication, or myalgias.  Assessment/Plan:   Orders Placed This Encounter  Procedures   VITAMIN D 25 Hydroxy (Vit-D Deficiency, Fractures)   TSH   T4, free   Lipid Panel With LDL/HDL Ratio   Insulin, random   Hemoglobin A1c   Folate   Comprehensive metabolic panel   CBC with Differential/Platelet   Vitamin B12    Medications Discontinued During This Encounter  Medication Reason   Misc Natural Products (RELIZEN) TABS    Vitamin D, Ergocalciferol, (DRISDOL) 1.25 MG (50000 UNIT) CAPS capsule Reorder     Meds ordered this encounter  Medications   Vitamin D, Ergocalciferol, (DRISDOL) 1.25 MG (50000 UNIT) CAPS capsule    Sig: Take 1 capsule (50,000 Units total) by mouth every 7 (seven) days.    Dispense:  9 capsule    Refill:  0    60 d supply;  ** OV for RF **   Do not send RF request   metFORMIN (GLUCOPHAGE) 500 MG tablet    Sig: 1/2 po with lunch and dinner daily    Dispense:  30 tablet    Refill:  0    30 d supply;  ** OV for RF **   Do not send RF request     1. Adjustment disorder, with anxiety d/t caregiver stress I recommended the patient to discussed her concerns about menopausal related to mood and vasomotor symptoms with her GYN again, to see if this medications is  still appropriate or not especially with onset of her new concerns. We will check labs today. She declines Dr. Mallie Mussel (counseling) referral today.   - TSH - T4, free  2. Insulin resistance We will check labs today, and we will refill metformin for 1 month. Patient is to get back to exercising daily, and she will follow her meal plan with decreased simple carbohydrates and increase protein and fiber.   - Insulin, random - Hemoglobin A1c - Comprehensive metabolic panel - metFORMIN (GLUCOPHAGE) 500 MG tablet; 1/2 po with lunch and dinner daily  Dispense: 30 tablet; Refill: 0  3. Vitamin D deficiency We will  check labs today, and we will refill prescription Vitamin D for 2 months. She will follow-up for routine testing of Vitamin D, at least 2-3 times per year to avoid over-replacement.  - Vitamin D, Ergocalciferol, (DRISDOL) 1.25 MG (50000 UNIT) CAPS capsule; Take 1 capsule (50,000 Units total) by mouth every 7 (seven) days.  Dispense: 9 capsule; Refill: 0 - VITAMIN D 25 Hydroxy (Vit-D Deficiency, Fractures)  4. SOB (shortness of breath) on exertion We will check labs today. We will change her meal plan from category 3 to category 2 plan. I explained to the patient what this means, and ways to improve her RMR and that her actual RMR is better than expected.   - TSH - T4, free - Folate - Comprehensive metabolic panel - CBC with Differential/Platelet - Vitamin B12  5. Other hyperlipidemia We will check labs today. Patient will continue her prudent nutritional plan with low saturated and trans fats.   - Lipid Panel With LDL/HDL Ratio - Comprehensive metabolic panel  6. Obesity with current BMI of 27.7 Theresa Johnson is currently in the action stage of change. As such, her goal is to continue with weight loss efforts. She has agreed to change to the Category 2 Plan with extra [place] yogurt due to repeat IC today.   Exercise goals: As is.   Behavioral modification strategies: emotional eating strategies and avoiding temptations.  Theresa Johnson has agreed to follow-up with our clinic at the next available appointment to go over labs and follow-up on what GYN says in regards to mood medications. She was informed of the importance of frequent follow-up visits to maximize her success with intensive lifestyle modifications for her multiple health conditions.   Theresa Johnson was informed we would discuss her lab results at her next visit unless there is a critical issue that needs to be addressed sooner. Theresa Johnson agreed to keep her next visit at the agreed upon time to discuss these results.  Objective:   Blood  pressure 121/69, pulse 60, temperature 98.1 F (36.7 C), height '5\' 6"'$  (1.676 m), weight 171 lb (77.6 kg), SpO2 99 %. Body mass index is 27.6 kg/m.  General: Cooperative, alert, well developed, in no acute distress. HEENT: Conjunctivae and lids unremarkable. Cardiovascular: Regular rhythm.  Lungs: Normal work of breathing. Neurologic: No focal deficits.   Lab Results  Component Value Date   CREATININE 0.76 06/03/2022   BUN 15 06/03/2022   NA 140 06/03/2022   K 4.2 06/03/2022   CL 104 06/03/2022   CO2 26 06/03/2022   Lab Results  Component Value Date   ALT 21 06/03/2022   AST 23 06/03/2022   ALKPHOS 99 06/03/2022   BILITOT 0.6 06/03/2022   Lab Results  Component Value Date   HGBA1C 5.2 06/03/2022   HGBA1C 5.3 11/24/2021   HGBA1C 5.5 07/31/2021   HGBA1C 5.4 10/03/2020   Lab  Results  Component Value Date   INSULIN 4.8 06/03/2022   INSULIN 3.1 11/24/2021   INSULIN 3.3 07/31/2021   INSULIN 6.1 02/04/2021   INSULIN 5.5 10/03/2020   Lab Results  Component Value Date   TSH 1.380 06/03/2022   Lab Results  Component Value Date   CHOL 194 06/03/2022   HDL 72 06/03/2022   LDLCALC 110 (H) 06/03/2022   TRIG 67 06/03/2022   CHOLHDL 2.5 11/24/2021   Lab Results  Component Value Date   VD25OH 55.1 06/03/2022   VD25OH 49.1 11/24/2021   VD25OH 50.8 07/31/2021   Lab Results  Component Value Date   WBC 5.1 06/03/2022   HGB 13.4 06/03/2022   HCT 41.7 06/03/2022   MCV 102 (H) 06/03/2022   PLT 195 06/03/2022   Lab Results  Component Value Date   FERRITIN 234.7 07/03/2020   Attestation Statements:   Reviewed by clinician on day of visit: allergies, medications, problem list, medical history, surgical history, family history, social history, and previous encounter notes.  Time spent on visit including pre-visit chart review and post-visit care and charting was 45 minutes.   [name], am acting as transcriptionist for [employer], DO.  I have reviewed  the above documentation for accuracy and completeness, and I agree with the above. Marjory Sneddon, D.O.  The [place] was signed into law in 2016 which includes the topic of electronic health records.  This provides immediate access to information in MyChart.  This includes consultation notes, operative notes, office notes, lab results and pathology reports.  If you have any questions about what you read please let us know at your next visit so we can discuss your concerns and take corrective action if need be.  We are right here with you.

## 2022-07-01 ENCOUNTER — Encounter (INDEPENDENT_AMBULATORY_CARE_PROVIDER_SITE_OTHER): Payer: Self-pay | Admitting: Family Medicine

## 2022-07-01 ENCOUNTER — Ambulatory Visit (INDEPENDENT_AMBULATORY_CARE_PROVIDER_SITE_OTHER): Payer: BC Managed Care – PPO | Admitting: Family Medicine

## 2022-07-01 VITALS — BP 118/73 | HR 67 | Temp 98.4°F | Ht 66.0 in | Wt 166.0 lb

## 2022-07-01 DIAGNOSIS — E669 Obesity, unspecified: Secondary | ICD-10-CM

## 2022-07-01 DIAGNOSIS — E88819 Insulin resistance, unspecified: Secondary | ICD-10-CM | POA: Diagnosis not present

## 2022-07-01 DIAGNOSIS — E7849 Other hyperlipidemia: Secondary | ICD-10-CM | POA: Diagnosis not present

## 2022-07-01 DIAGNOSIS — E559 Vitamin D deficiency, unspecified: Secondary | ICD-10-CM | POA: Diagnosis not present

## 2022-07-01 DIAGNOSIS — Z6826 Body mass index (BMI) 26.0-26.9, adult: Secondary | ICD-10-CM

## 2022-07-01 MED ORDER — VITAMIN D (ERGOCALCIFEROL) 1.25 MG (50000 UNIT) PO CAPS: 50000.0000 [IU] | ORAL_CAPSULE | ORAL | 0 refills | Status: DC

## 2022-07-02 ENCOUNTER — Other Ambulatory Visit (INDEPENDENT_AMBULATORY_CARE_PROVIDER_SITE_OTHER): Payer: Self-pay | Admitting: Family Medicine

## 2022-07-02 DIAGNOSIS — E88819 Insulin resistance, unspecified: Secondary | ICD-10-CM

## 2022-07-13 ENCOUNTER — Ambulatory Visit (INDEPENDENT_AMBULATORY_CARE_PROVIDER_SITE_OTHER): Payer: BC Managed Care – PPO | Admitting: Family Medicine

## 2022-07-27 NOTE — Progress Notes (Signed)
Chief Complaint:   OBESITY Theresa Johnson is here to discuss her progress with her obesity treatment plan along with follow-up of her obesity related diagnoses. Theresa Johnson is on the Category 2 Plan and states she is following her eating plan approximately 85% of the time. Theresa Johnson states she is Pilates 50 minutes 6 times per week.  Today's visit was #: 10 Starting weight: 6 LBS Starting date: 10/03/2020 Today's weight: 166 LBS Today's date: 07/01/2022 Total lbs lost to date: 54 LBS Total lbs lost since last in-office visit: 5 LBS  Interim History: Patient is doing much better than last office visit.  She is back on track and feels less stressed and more focused.  No issues with meal plan.  She has CBC, CMP, FLP, A1c, AFI, B12, folate, vitamin D, TSH, FT4, drawn at last office visit.  Patient here today to review and discuss lab work.  Subjective:   1. Vitamin D deficiency Labs discussed with patient today. Labs drawn on 06/03/2022.  Vitamin D at goal at 55.1.  2. Insulin resistance Discussed labs with patient today. Patient never started metformin.  Patient does not think she needs it.  Patient has good control of hunger and cravings now.  Fasting insulin was 4.8, A1c was 5.2.  3. Other hyperlipidemia Discussed labs with patient today.  Patient has been very close relief following meal plan and regularly works out vigorously.  Assessment/Plan:  No orders of the defined types were placed in this encounter.   Medications Discontinued During This Encounter  Medication Reason   Vitamin D, Ergocalciferol, (DRISDOL) 1.25 MG (50000 UNIT) CAPS capsule Reorder   metFORMIN (GLUCOPHAGE) 500 MG tablet      Meds ordered this encounter  Medications   Vitamin D, Ergocalciferol, (DRISDOL) 1.25 MG (50000 UNIT) CAPS capsule    Sig: Take 1 capsule (50,000 Units total) by mouth every 7 (seven) days.    Dispense:  9 capsule    Refill:  0    60 d supply;  ** OV for RF **   Do not send RF request      1. Vitamin D deficiency Vitamin D at goal, continue current treatment plan.  Refill- Vitamin D, Ergocalciferol, (DRISDOL) 1.25 MG (50000 UNIT) CAPS capsule; Take 1 capsule (50,000 Units total) by mouth every 7 (seven) days.  Dispense: 9 capsule; Refill: 0  2. Insulin resistance Discontinue metformin, labs are stable.  Continue PNP, exercise and maintain weight loss.  3. Other hyperlipidemia LDL and HDL are stable at 110 and 72 respectively.  Continue to eat low saturated and trans fat.  Continue exercise.  4. Obesity with current BMI of 26.8 Theresa Johnson is currently in the action stage of change. As such, her goal is to continue with weight loss efforts. She has agreed to the Category 2 Plan+ extra [place] yogurt.  Exercise goals:  As is.  Behavioral modification strategies: holiday eating strategies .  Theresa Johnson has agreed to follow-up with our clinic in 4 weeks. She was informed of the importance of frequent follow-up visits to maximize her success with intensive lifestyle modifications for her multiple health conditions.   Objective:   Blood pressure 118/73, pulse 67, temperature 98.4 F (36.9 C), height '5\' 6"'$  (1.676 m), weight 166 lb (75.3 kg), SpO2 99 %. Body mass index is 26.79 kg/m.  General: Cooperative, alert, well developed, in no acute distress. HEENT: Conjunctivae and lids unremarkable. Cardiovascular: Regular rhythm.  Lungs: Normal work of breathing. Neurologic: No focal deficits.   Lab  Results  Component Value Date   CREATININE 0.76 06/03/2022   BUN 15 06/03/2022   NA 140 06/03/2022   K 4.2 06/03/2022   CL 104 06/03/2022   CO2 26 06/03/2022   Lab Results  Component Value Date   ALT 21 06/03/2022   AST 23 06/03/2022   ALKPHOS 99 06/03/2022   BILITOT 0.6 06/03/2022   Lab Results  Component Value Date   HGBA1C 5.2 06/03/2022   HGBA1C 5.3 11/24/2021   HGBA1C 5.5 07/31/2021   HGBA1C 5.4 10/03/2020   Lab Results  Component Value Date   INSULIN 4.8  06/03/2022   INSULIN 3.1 11/24/2021   INSULIN 3.3 07/31/2021   INSULIN 6.1 02/04/2021   INSULIN 5.5 10/03/2020   Lab Results  Component Value Date   TSH 1.380 06/03/2022   Lab Results  Component Value Date   CHOL 194 06/03/2022   HDL 72 06/03/2022   LDLCALC 110 (H) 06/03/2022   TRIG 67 06/03/2022   CHOLHDL 2.5 11/24/2021   Lab Results  Component Value Date   VD25OH 55.1 06/03/2022   VD25OH 49.1 11/24/2021   VD25OH 50.8 07/31/2021   Lab Results  Component Value Date   WBC 5.1 06/03/2022   HGB 13.4 06/03/2022   HCT 41.7 06/03/2022   MCV 102 (H) 06/03/2022   PLT 195 06/03/2022   Lab Results  Component Value Date   FERRITIN 234.7 07/03/2020   Attestation Statements:   Reviewed by clinician on day of visit: allergies, medications, problem list, medical history, surgical history, family history, social history, and previous encounter notes.  I, Davy Pique, RMA, am acting as Location manager for [employer], DO.   I have reviewed the above documentation for accuracy and completeness, and I agree with the above. Marjory Sneddon, D.O.  The [place] was signed into law in 2016 which includes the topic of electronic health records.  This provides immediate access to information in MyChart.  This includes consultation notes, operative notes, office notes, lab results and pathology reports.  If you have any questions about what you read please let us know at your next visit so we can discuss your concerns and take corrective action if need be.  We are right here with you.

## 2022-08-13 ENCOUNTER — Encounter (INDEPENDENT_AMBULATORY_CARE_PROVIDER_SITE_OTHER): Payer: Self-pay | Admitting: Family Medicine

## 2022-08-13 ENCOUNTER — Ambulatory Visit (INDEPENDENT_AMBULATORY_CARE_PROVIDER_SITE_OTHER): Payer: BC Managed Care – PPO | Admitting: Family Medicine

## 2022-08-13 VITALS — BP 122/81 | HR 67 | Temp 98.5°F | Ht 66.0 in | Wt 168.6 lb

## 2022-08-13 DIAGNOSIS — E66812 Obesity, class 2: Secondary | ICD-10-CM | POA: Insufficient documentation

## 2022-08-13 DIAGNOSIS — Z6827 Body mass index (BMI) 27.0-27.9, adult: Secondary | ICD-10-CM

## 2022-08-13 DIAGNOSIS — E559 Vitamin D deficiency, unspecified: Secondary | ICD-10-CM

## 2022-08-13 DIAGNOSIS — E669 Obesity, unspecified: Secondary | ICD-10-CM

## 2022-08-13 DIAGNOSIS — F4320 Adjustment disorder, unspecified: Secondary | ICD-10-CM

## 2022-08-13 MED ORDER — VITAMIN D (ERGOCALCIFEROL) 1.25 MG (50000 UNIT) PO CAPS: 50000.0000 [IU] | ORAL_CAPSULE | ORAL | 0 refills | Status: DC

## 2022-08-29 NOTE — Progress Notes (Unsigned)
Chief Complaint:   OBESITY Theresa Johnson is here to discuss her progress with her obesity treatment plan along with follow-up of her obesity related diagnoses. [name] is on the Category 2 Plan + extra [place] yogurt and states she is following her eating plan approximately 70% of the time. [name] states she is working out at [employer] 50 minutes 3 times per week.  Today's visit was #: 23 Starting weight: 72 LBS Starting date: 10/03/2020 Today's weight: 168 LBS Today's date: 08/13/2022 Total lbs lost to date: 77 LBS Total lbs lost since last in-office visit: +2 LBS  Interim History: Patient enjoyed Christmas and New Year's and went off plan a lot.  However, she got right back on plan afterwards.  She is still struggling with taking care of her mother and also getting back to work, and figuring out the future.  Subjective:   1. Vitamin D deficiency Doing well on weekly medication.  Last vitamin D level at the end of October 2023 was at goal, 43.  2. Adjustment disorder with caregiver stress Patient is doing better with life stresses, and has less anxiety.  Assessment/Plan:  No orders of the defined types were placed in this encounter.   Medications Discontinued During This Encounter  Medication Reason   Vitamin D, Ergocalciferol, (DRISDOL) 1.25 MG (50000 UNIT) CAPS capsule Reorder     Meds ordered this encounter  Medications   Vitamin D, Ergocalciferol, (DRISDOL) 1.25 MG (50000 UNIT) CAPS capsule    Sig: Take 1 capsule (50,000 Units total) by mouth every 7 (seven) days.    Dispense:  9 capsule    Refill:  0    60 d supply;  ** OV for RF **   Do not send RF request     1. Vitamin D deficiency Low Vitamin D level contributes to fatigue and are associated with obesity, breast, and colon cancer. She agrees to continue to take prescription Vitamin D '@50'$ ,000 IU every week and will follow-up for routine testing of Vitamin D, at least 2-3 times per year to avoid  over-replacement.  Refill- Vitamin D, Ergocalciferol, (DRISDOL) 1.25 MG (50000 UNIT) CAPS capsule; Take 1 capsule (50,000 Units total) by mouth every 7 (seven) days.  Dispense: 9 capsule; Refill: 0  2. Adjustment disorder with caregiver stress Counseling done on medication.  Continue to exercise and focus on self-care activities.  Advised counseling with professional to work on personal growth and development.  Patient given a list of names to contact for appointment.  3. Obesity with current BMI of 27.2 Continue same meal planning and exercise regimen.  [name] is currently in the action stage of change. As such, her goal is to continue with weight loss efforts. She has agreed to the Category 2 Plan + extra [place] yogurt.   Exercise goals:  As is.  Behavioral modification strategies: emotional eating strategies and avoiding temptations.  [name] has agreed to follow-up with our clinic in 6-8 weeks. She was informed of the importance of frequent follow-up visits to maximize her success with intensive lifestyle modifications for her multiple health conditions.   Objective:   Blood pressure 122/81, pulse 67, temperature 98.5 F (36.9 C), height '5\' 6"'$  (1.676 m), weight 168 lb 9.6 oz (76.5 kg), SpO2 99 %. Body mass index is 27.21 kg/m.  General: Cooperative, alert, well developed, in no acute distress. HEENT: Conjunctivae and lids unremarkable. Cardiovascular: Regular rhythm.  Lungs: Normal work of breathing. Neurologic: No focal deficits.   Lab Results  Component Value Date   CREATININE 0.76 06/03/2022   BUN 15 06/03/2022   NA 140 06/03/2022   K 4.2 06/03/2022   CL 104 06/03/2022   CO2 26 06/03/2022   Lab Results  Component Value Date   ALT 21 06/03/2022   AST 23 06/03/2022   ALKPHOS 99 06/03/2022   BILITOT 0.6 06/03/2022   Lab Results  Component Value Date   HGBA1C 5.2 06/03/2022   HGBA1C 5.3 11/24/2021   HGBA1C 5.5 07/31/2021   HGBA1C 5.4 10/03/2020   Lab Results   Component Value Date   INSULIN 4.8 06/03/2022   INSULIN 3.1 11/24/2021   INSULIN 3.3 07/31/2021   INSULIN 6.1 02/04/2021   INSULIN 5.5 10/03/2020   Lab Results  Component Value Date   TSH 1.380 06/03/2022   Lab Results  Component Value Date   CHOL 194 06/03/2022   HDL 72 06/03/2022   LDLCALC 110 (H) 06/03/2022   TRIG 67 06/03/2022   CHOLHDL 2.5 11/24/2021   Lab Results  Component Value Date   VD25OH 55.1 06/03/2022   VD25OH 49.1 11/24/2021   VD25OH 50.8 07/31/2021   Lab Results  Component Value Date   WBC 5.1 06/03/2022   HGB 13.4 06/03/2022   HCT 41.7 06/03/2022   MCV 102 (H) 06/03/2022   PLT 195 06/03/2022   Lab Results  Component Value Date   FERRITIN 234.7 07/03/2020   Attestation Statements:   Reviewed by clinician on day of visit: allergies, medications, problem list, medical history, surgical history, family history, social history, and previous encounter notes.  I, Davy Pique, RMA, am acting as Location manager for [employer], DO.   I have reviewed the above documentation for accuracy and completeness, and I agree with the above. Marjory Sneddon, D.O.  The [place] was signed into law in 2016 which includes the topic of electronic health records.  This provides immediate access to information in MyChart.  This includes consultation notes, operative notes, office notes, lab results and pathology reports.  If you have any questions about what you read please let us know at your next visit so we can discuss your concerns and take corrective action if need be.  We are right here with you.

## 2022-09-10 DIAGNOSIS — L821 Other seborrheic keratosis: Secondary | ICD-10-CM | POA: Diagnosis not present

## 2022-09-10 DIAGNOSIS — B360 Pityriasis versicolor: Secondary | ICD-10-CM | POA: Diagnosis not present

## 2022-09-10 DIAGNOSIS — L680 Hirsutism: Secondary | ICD-10-CM | POA: Diagnosis not present

## 2022-09-23 ENCOUNTER — Ambulatory Visit (INDEPENDENT_AMBULATORY_CARE_PROVIDER_SITE_OTHER): Payer: BC Managed Care – PPO | Admitting: Family Medicine

## 2022-09-29 ENCOUNTER — Encounter (INDEPENDENT_AMBULATORY_CARE_PROVIDER_SITE_OTHER): Payer: Self-pay | Admitting: Family Medicine

## 2022-09-29 ENCOUNTER — Ambulatory Visit (INDEPENDENT_AMBULATORY_CARE_PROVIDER_SITE_OTHER): Payer: BC Managed Care – PPO | Admitting: Family Medicine

## 2022-09-29 VITALS — BP 133/74 | HR 64 | Temp 98.8°F | Ht 66.0 in | Wt 165.0 lb

## 2022-09-29 DIAGNOSIS — F5089 Other specified eating disorder: Secondary | ICD-10-CM | POA: Diagnosis not present

## 2022-09-29 DIAGNOSIS — Z6826 Body mass index (BMI) 26.0-26.9, adult: Secondary | ICD-10-CM

## 2022-09-29 DIAGNOSIS — E559 Vitamin D deficiency, unspecified: Secondary | ICD-10-CM | POA: Diagnosis not present

## 2022-09-29 DIAGNOSIS — E88819 Insulin resistance, unspecified: Secondary | ICD-10-CM

## 2022-09-29 MED ORDER — VITAMIN D (ERGOCALCIFEROL) 1.25 MG (50000 UNIT) PO CAPS: 50000.0000 [IU] | ORAL_CAPSULE | ORAL | 0 refills | Status: DC

## 2022-10-01 DIAGNOSIS — Z6826 Body mass index (BMI) 26.0-26.9, adult: Secondary | ICD-10-CM | POA: Insufficient documentation

## 2022-10-01 DIAGNOSIS — E88819 Insulin resistance, unspecified: Secondary | ICD-10-CM | POA: Insufficient documentation

## 2022-10-01 DIAGNOSIS — F509 Eating disorder, unspecified: Secondary | ICD-10-CM | POA: Insufficient documentation

## 2022-10-12 MED ORDER — METFORMIN HCL 500 MG PO TABS: ORAL_TABLET | ORAL | 0 refills | Status: DC

## 2022-10-12 NOTE — Progress Notes (Signed)
Chief Complaint:   OBESITY Theresa Johnson is here to discuss her progress with her obesity treatment plan along with follow-up of her obesity related diagnoses. Theresa Johnson is on the Category 2 Plan with extra [place] yogurt and states she is following her eating plan approximately 70% of the time. Theresa Johnson states she is exercising 50 minutes 5 times per week.  Today's visit was #: 24 Starting weight: 227 lbs Starting date: 10/03/2020 Today's weight: 165 lbs Today's date: 09/29/2022 Total lbs lost to date: 62 lbs Total lbs lost since last in-office visit: 3 lbs  Interim History: Patient came back from [place] this past Sunday and ate and drink what ever she wanted to for couple of days but back on it the next day. A lot of caretaker stressors lately, her mother, uncle, etc.   Subjective:   1. Vitamin D deficiency She is currently taking prescription vitamin D 50,000 IU each week. She denies nausea, vomiting or muscle weakness. Last vitamin D level was 55.1.  2. Insulin resistance Labs are under great control and fasting insulin now WNL's, but patient is having a lot of sweets and carb cravings.  She never started metformin.  3. Other disorder of eating-emotional eating Increase stress if patient does not exercise daily.  She does not do well with her emotional eating.  Assessment/Plan:  No orders of the defined types were placed in this encounter.   Medications Discontinued During This Encounter  Medication Reason   Vitamin D, Ergocalciferol, (DRISDOL) 1.25 MG (50000 UNIT) CAPS capsule Reorder     Meds ordered this encounter  Medications   Vitamin D, Ergocalciferol, (DRISDOL) 1.25 MG (50000 UNIT) CAPS capsule    Sig: Take 1 capsule (50,000 Units total) by mouth every 7 (seven) days.    Dispense:  8 capsule    Refill:  0    60 d supply;  ** OV for RF **   Do not send RF request   metFORMIN (GLUCOPHAGE) 500 MG tablet    Sig: Start 250 mg with dinner or lunch daily x2-6 days, then  increase to 1/2 tablet bid    Dispense:  30 tablet    Refill:  0    30 d supply;  ** OV for RF **   Do not send RF request     1. Vitamin D deficiency Low Vitamin D level contributes to fatigue and are associated with obesity, breast, and colon cancer. She agrees to continue to take prescription Vitamin D '@50'$ ,000 IU every week and will follow-up for routine testing of Vitamin D, at least 2-3 times per year to avoid over-replacement.  Refill- Vitamin D, Ergocalciferol, (DRISDOL) 1.25 MG (50000 UNIT) CAPS capsule; Take 1 capsule (50,000 Units total) by mouth every 7 (seven) days.  Dispense: 8 capsule; Refill: 0  2. Insulin resistance Start metformin 250 mg with dinner or lunch for 2 to 6 days then increase to half a tablet at lunch or dinner.  No need for refill as she has prescription from before.  3. Other disorder of eating-emotional eating I discussed with patient today again strongly recommend she make appointment with emotional eating counselor.  She declines Dr. Mallie Mussel and will be referred to her.  I gave her handout on providers in the area.  4. BMI 26.0-26.9,adult-current bmi 26.8  5. Morbid obesity (HCC)-start bmi 36.64 She still doubts she can sustain, etc.  Feels she does some emotional eating still.  I highly recommend she make an appointment with a  counselor to discuss her stressors.   Theresa Johnson is currently in the action stage of change. As such, her goal is to continue with weight loss efforts. She has agreed to the Category 2 Plan with an extra [place] yogurt  Exercise goals:  As is, doing great.  Behavioral modification strategies: meal planning and cooking strategies and keeping healthy foods in the home.  Theresa Johnson has agreed to follow-up with our clinic in 4 weeks. She was informed of the importance of frequent follow-up visits to maximize her success with intensive lifestyle modifications for her multiple health conditions.   Objective:   Blood pressure 133/74, pulse 64,  temperature 98.8 F (37.1 C), height '5\' 6"'$  (1.676 m), weight 165 lb (74.8 kg), SpO2 100 %. Body mass index is 26.63 kg/m.  General: Cooperative, alert, well developed, in no acute distress. HEENT: Conjunctivae and lids unremarkable. Cardiovascular: Regular rhythm.  Lungs: Normal work of breathing. Neurologic: No focal deficits.   Lab Results  Component Value Date   CREATININE 0.76 06/03/2022   BUN 15 06/03/2022   NA 140 06/03/2022   K 4.2 06/03/2022   CL 104 06/03/2022   CO2 26 06/03/2022   Lab Results  Component Value Date   ALT 21 06/03/2022   AST 23 06/03/2022   ALKPHOS 99 06/03/2022   BILITOT 0.6 06/03/2022   Lab Results  Component Value Date   HGBA1C 5.2 06/03/2022   HGBA1C 5.3 11/24/2021   HGBA1C 5.5 07/31/2021   HGBA1C 5.4 10/03/2020   Lab Results  Component Value Date   INSULIN 4.8 06/03/2022   INSULIN 3.1 11/24/2021   INSULIN 3.3 07/31/2021   INSULIN 6.1 02/04/2021   INSULIN 5.5 10/03/2020   Lab Results  Component Value Date   TSH 1.380 06/03/2022   Lab Results  Component Value Date   CHOL 194 06/03/2022   HDL 72 06/03/2022   LDLCALC 110 (H) 06/03/2022   TRIG 67 06/03/2022   CHOLHDL 2.5 11/24/2021   Lab Results  Component Value Date   VD25OH 55.1 06/03/2022   VD25OH 49.1 11/24/2021   VD25OH 50.8 07/31/2021   Lab Results  Component Value Date   WBC 5.1 06/03/2022   HGB 13.4 06/03/2022   HCT 41.7 06/03/2022   MCV 102 (H) 06/03/2022   PLT 195 06/03/2022   Lab Results  Component Value Date   FERRITIN 234.7 07/03/2020   Attestation Statements:   Reviewed by clinician on day of visit: allergies, medications, problem list, medical history, surgical history, family history, social history, and previous encounter notes.  I, Davy Pique, RMA, am acting as Location manager for [employer], DO.  I have reviewed the above documentation for accuracy and completeness, and I agree with the above. Marjory Sneddon, D.O.  The [place] was signed into law in 2016 which includes the topic of electronic health records.  This provides immediate access to information in MyChart.  This includes consultation notes, operative notes, office notes, lab results and pathology reports.  If you have any questions about what you read please let us know at your next visit so we can discuss your concerns and take corrective action if need be.  We are right here with you.

## 2022-10-16 IMAGING — MR MR PELVIS WO/W CM
12 of 19 series · 31 of 48 positions shown · IV contrast (15 ML MULTIHANCE)
Comparison: 03/11/2021 from [REDACTED] [REDACTED]

CLINICAL DATA: Follow-up uterine fibroids. 7 months status post
uterine artery embolization.

EXAM:
MRI PELVIS WITHOUT AND WITH CONTRAST
TECHNIQUE: Multiplanar multisequence MR imaging of the pelvis was performed
both before and after administration of intravenous contrast.
CONTRAST:  15mL MULTIHANCE GADOBENATE DIMEGLUMINE 529 MG/ML IV SOLN

[Series 3: T2 · coronal · 5.0mm · 0.78mm/px · 3 of 33 slices shown (1 of 3)]
[im 1/33]
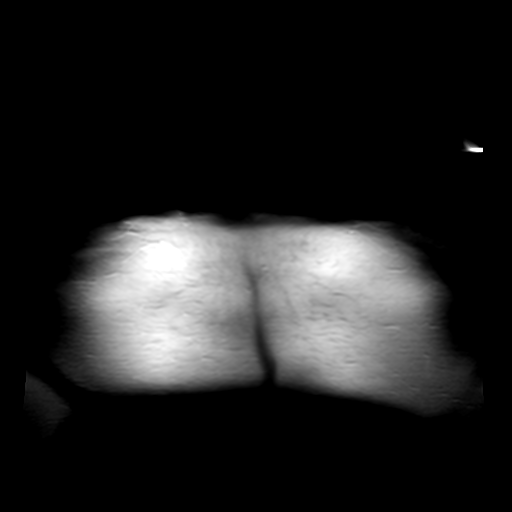
[im 17/33]
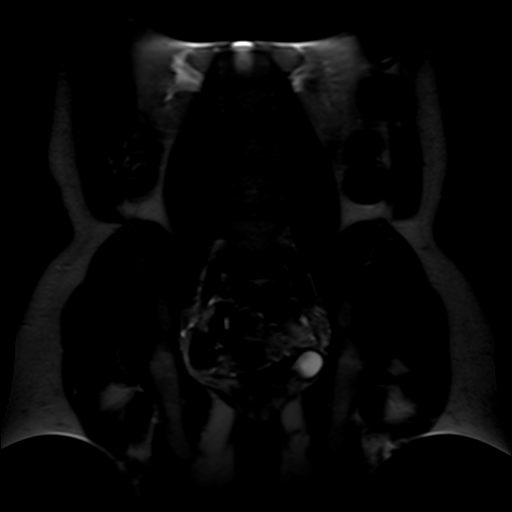
[im 33/33]
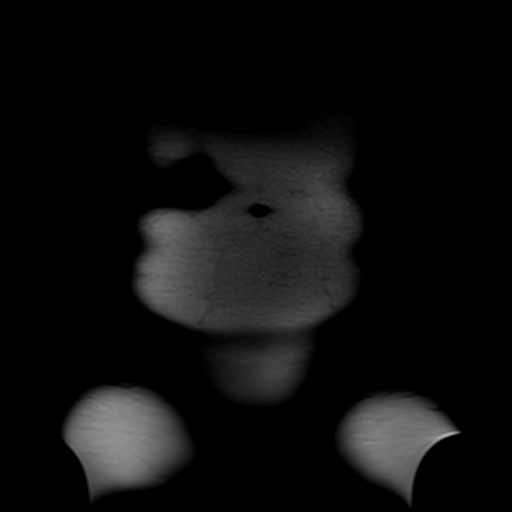

[Series 4: t2_tse_sag · sagittal · 5.0mm · 0.65mm/px · 3 of 33 slices shown]
[im 1/33]
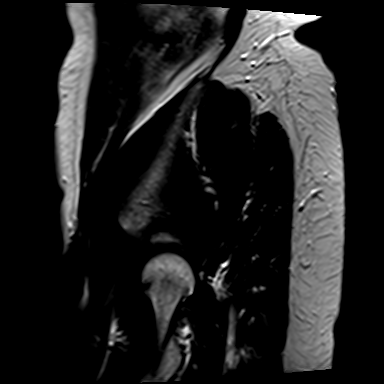
[im 17/33]
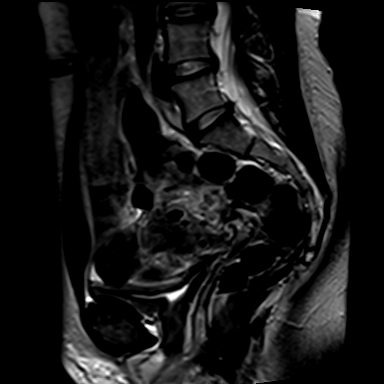
[im 33/33]
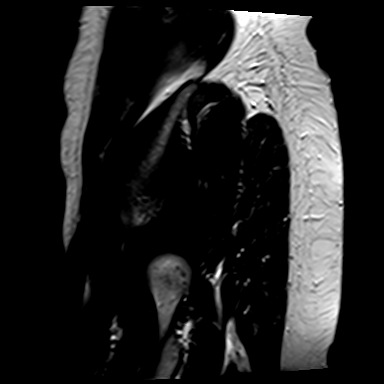

[Series 5: T2 · axial · 5.0mm · 0.94mm/px · z∈[-119,+49]mm · 2 of 29 slices shown (2 of 3)]
[im 1/29]
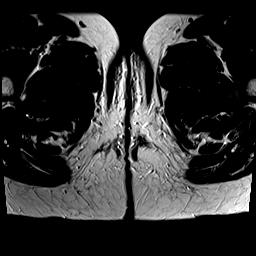
[im 29/29]
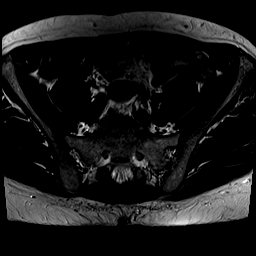

[Series 6: T2 fat-sat · axial · 5.0mm · 0.94mm/px · z∈[-119,+49]mm · 2 of 29 slices shown]
[im 1/29]
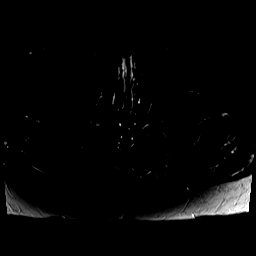
[im 29/29]
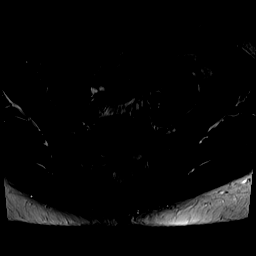

[Series 7: T2 · coronal · 3.0mm · 0.81mm/px · 3 of 44 slices shown (3 of 3)]
[im 1/44]
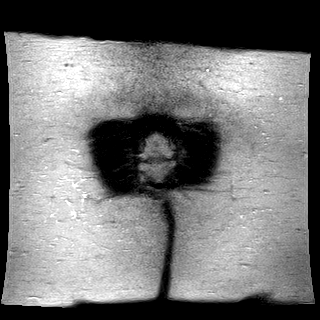
[im 22/44]
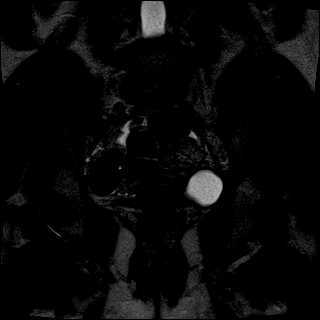
[im 44/44]
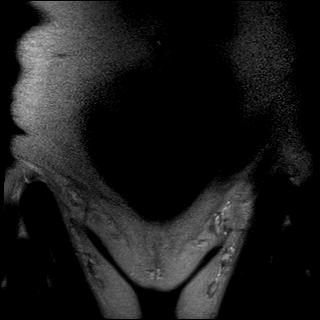

[Series 8: DWI · axial · 5.0mm · 1.70mm/px · z∈[-129,+57]mm · 6 of 95 slices shown]
[im 1/95]
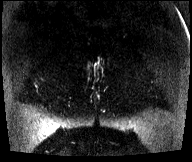
[im 19/95]
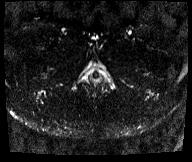
[im 38/95]
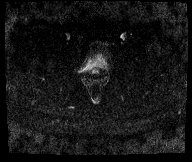
[im 57/95]
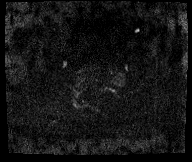
[im 76/95]
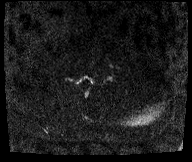
[im 95/95]
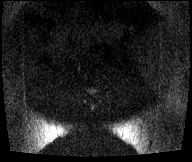

[Series 9: axial dwi_adc · axial · 5.0mm · 1.70mm/px · z∈[-129,+57]mm · 2 of 32 slices shown]
[im 1/32]
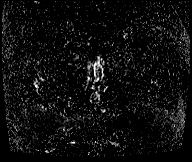
[im 32/32]
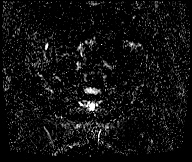

[Series 10: axial in out · axial · 5.5mm · 0.55mm/px · z∈[-115,+44]mm · 3 of 52 slices shown]
[im 1/52]
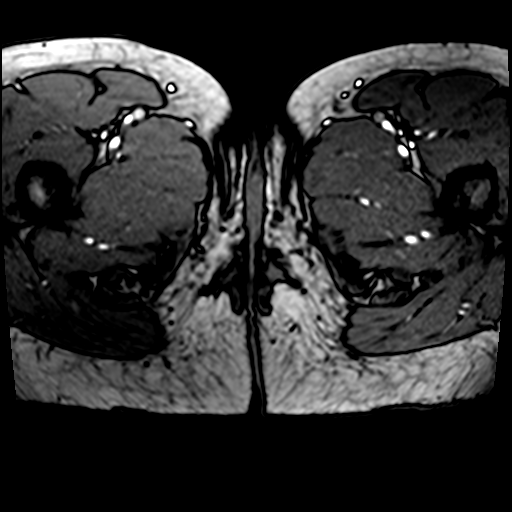
[im 26/52]
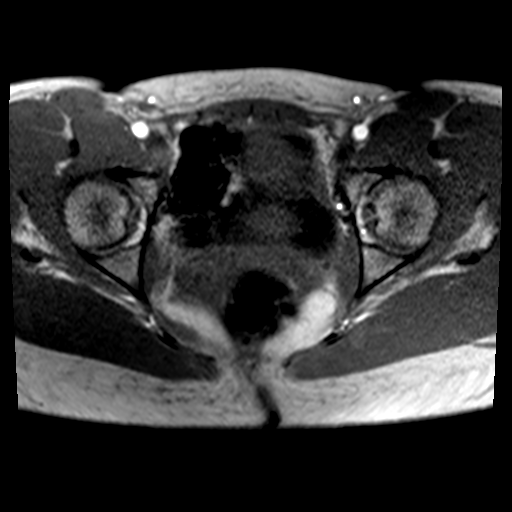
[im 52/52]
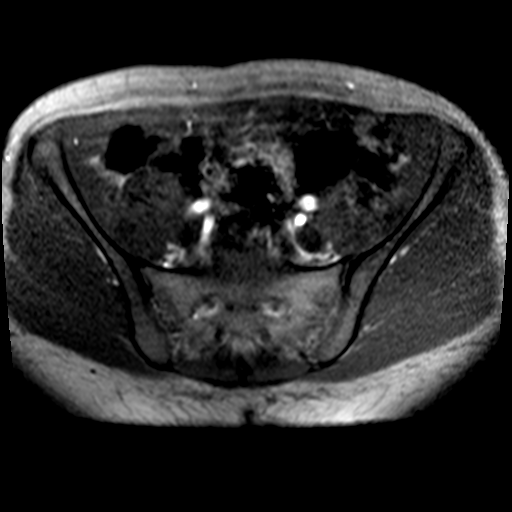

[Series 11: T1 dynamic · axial · non-contrast · 4.0mm · 0.49mm/px · z∈[-105,+35]mm · 2 of 36 slices shown (1 of 2)]
[im 1/36]
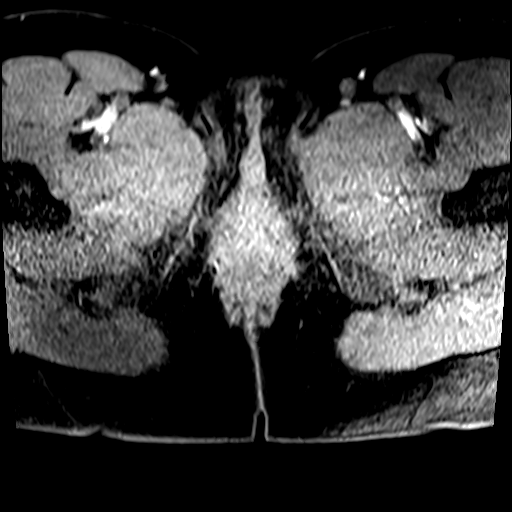
[im 36/36]
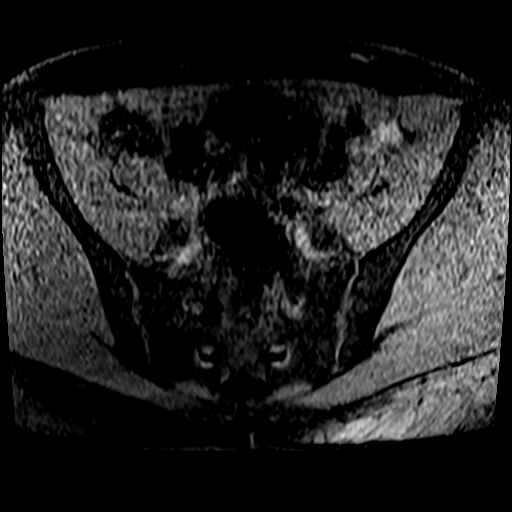

[Series 12: T1 dynamic post-contrast · axial · 4.0mm · 0.49mm/px · z∈[-82,+42]mm · 2 of 32 slices shown (1 of 2)]
[im 1/32]
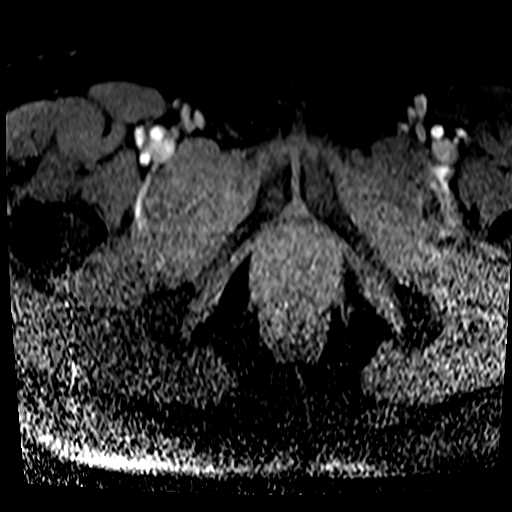
[im 32/32]
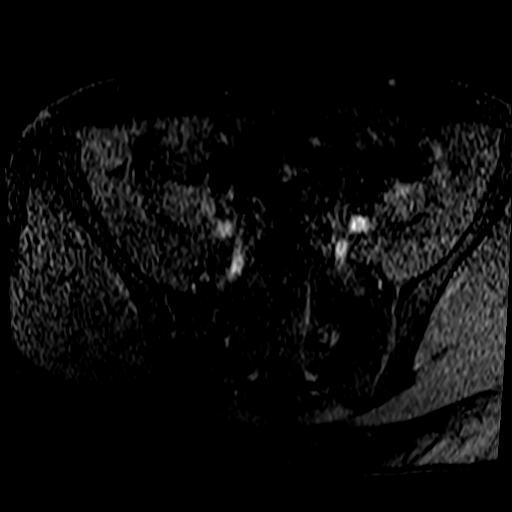

[Series 13: T1 dynamic post-contrast · axial · 4.0mm · 0.49mm/px · z∈[-105,+35]mm · 2 of 36 slices shown (2 of 2)]
[im 1/36]
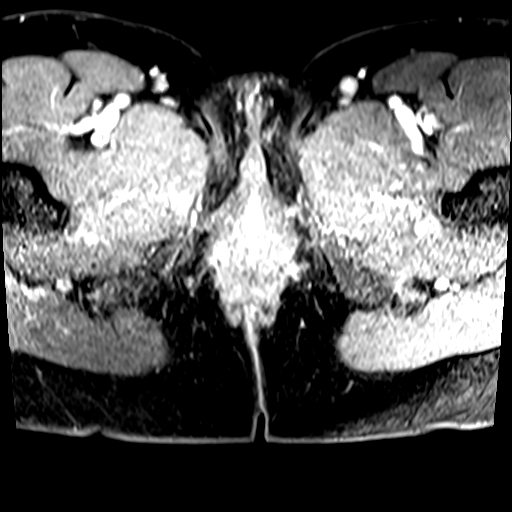
[im 36/36]
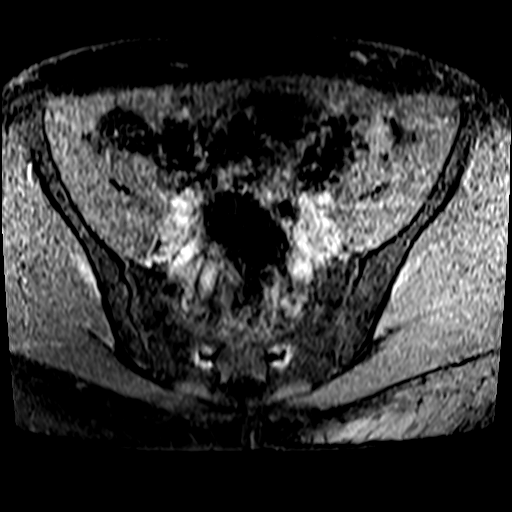

[Series 14: T1 dynamic · axial · 4.0mm · 0.49mm/px · 1 of 36 slices shown (2 of 2)]
[im 1/36]
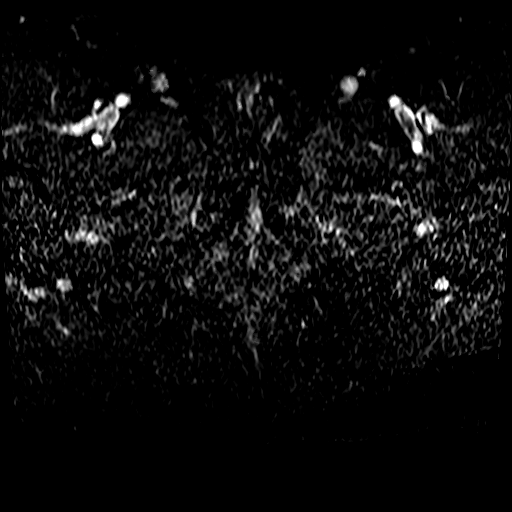

[31 of 48 positions shown; findings below may reference images not displayed]

FINDINGS: Lower Urinary Tract: No urinary bladder or urethral abnormality
identified.

Bowel: Unremarkable pelvic bowel loops.

Vascular/Lymphatic: Unremarkable. No pathologically enlarged pelvic
lymph nodes identified.

Reproductive:

-- Uterus: Measures 8.0 by 5.6 by 7.8 cm (volume = 180 cm^3). This
represents a decrease from 11.4 by 8.9 by 8.0 cm (volume = 420
cm^3) when remeasured in same planes on prior study.

Previously seen large anterior uterine adenomyoma is no longer
visualized on today's exam. Multiple uterine fibroids are again seen
which are submucosal, intramural, and subserosal in location but
have decreased in size since previous study. Most of these fibroids
show no residual contrast enhancement. A subserosal fibroid arising
from the left uterine fundus measuring 5.4 x 4.3 cm shows areas of
internal degeneration, but shows residual enhancement of
approximately 30% of its volume. A smaller subserosal fibroid in the
left anterior corpus measuring 3.1 x 2.8 cm shows diffuse residual
contrast enhancement. No new or enlarging uterine masses identified.

-- Right ovary: Appears normal. No ovarian or adnexal masses
identified.

-- Left ovary:  Not visualized, however no adnexal mass identified.

Other: Trace amount of free fluid noted.

Musculoskeletal:  Unremarkable.
IMPRESSION: Interval decrease in overall uterine volume and size of individual
fibroids since previous study, and resolution of previously seen
anterior uterine adenomyoma.

Two left-sided uterine fibroids show residual contrast enhancement,
as described above.

No adnexal mass identified.

## 2022-10-25 ENCOUNTER — Other Ambulatory Visit (INDEPENDENT_AMBULATORY_CARE_PROVIDER_SITE_OTHER): Payer: Self-pay | Admitting: Family Medicine

## 2022-10-25 DIAGNOSIS — E88819 Insulin resistance, unspecified: Secondary | ICD-10-CM

## 2022-11-03 ENCOUNTER — Encounter (INDEPENDENT_AMBULATORY_CARE_PROVIDER_SITE_OTHER): Payer: Self-pay | Admitting: Family Medicine

## 2022-11-03 ENCOUNTER — Ambulatory Visit (INDEPENDENT_AMBULATORY_CARE_PROVIDER_SITE_OTHER): Payer: BC Managed Care – PPO | Admitting: Family Medicine

## 2022-11-03 VITALS — BP 130/85 | HR 63 | Temp 98.2°F | Ht 66.0 in | Wt 164.4 lb

## 2022-11-03 DIAGNOSIS — E559 Vitamin D deficiency, unspecified: Secondary | ICD-10-CM | POA: Diagnosis not present

## 2022-11-03 DIAGNOSIS — E88819 Insulin resistance, unspecified: Secondary | ICD-10-CM

## 2022-11-03 DIAGNOSIS — F5089 Other specified eating disorder: Secondary | ICD-10-CM

## 2022-11-03 DIAGNOSIS — Z6826 Body mass index (BMI) 26.0-26.9, adult: Secondary | ICD-10-CM

## 2022-11-03 DIAGNOSIS — R030 Elevated blood-pressure reading, without diagnosis of hypertension: Secondary | ICD-10-CM | POA: Insufficient documentation

## 2022-11-03 MED ORDER — METFORMIN HCL 500 MG PO TABS: ORAL_TABLET | ORAL | 0 refills | Status: DC

## 2022-11-03 NOTE — Progress Notes (Signed)
Marjory Sneddon, D.O.  ABFM, ABOM Specializing in Clinical Bariatric Medicine  Office located at: [address], [place]  [address]     Assessment and Plan:   No orders of the defined types were placed in this encounter.   Medications Discontinued During This Encounter  Medication Reason   metFORMIN (GLUCOPHAGE) 500 MG tablet Reorder     Meds ordered this encounter  Medications   metFORMIN (GLUCOPHAGE) 500 MG tablet    Sig: 1/2 tab in am and 1/2 tab in pm    Dispense:  30 tablet    Refill:  0    30 d supply;  ** OV for RF **   Do not send RF request    For next OV she will get fasting lipid profile, fasting insulin,  CMP, and Vitamin D.  Insulin resistance Assessment: Condition is stable.  Lab Results  Component Value Date   HGBA1C 5.2 06/03/2022   HGBA1C 5.3 11/24/2021   HGBA1C 5.5 07/31/2021   INSULIN 4.8 06/03/2022   INSULIN 3.1 11/24/2021   INSULIN 3.3 07/31/2021  She has been taking her Metformin 500 mg occasionally and it has been very irregularly dosed. She denies any nausea and loose stools.   Plan:  - Changed the Metformin to 1/2 tab in am and 1/2 tab in pm.  She may advance to 1 full tab in breakfast and 1 full tab in dinner if she has increased cravings and hunger in the future, if tolerated.  - Continue to decrease simple carbs/ sugars; increase fiber and proteins -> follow her meal plan.   Ashby Dawes will continue to work on weight loss, exercise, via their meal plan we devised to help decrease the risk of progressing to diabetes.    Vitamin D deficiency Assessment: Condition is at goal.  Lab Results  Component Value Date   VD25OH 55.1 06/03/2022   VD25OH 49.1 11/24/2021   VD25OH 50.8 07/31/2021  She reports good compliance and tolerance with Ergocalciferol 50K IU weekly and multivitamins daily. Denies any side effects.  Plan: Continue with Ergocalciferol 50K IU weekly and multivitamins daily. - weight loss will likely improve  availability of vitamin D, thus encouraged Maela to continue with meal plan and their weight loss efforts to further improve this condition.  Thus, we will need to monitor levels regularly (every 3-4 mo on average) to keep levels within normal limits and prevent over supplementation.    Other disorder of eating-emotional eating Assessment: Condition is stable Denies any SI/HI. Mood is stable. She has not yet met with Dr.Barker, our Bariatric Psychologist because she has been very busy with her work and personal life.   Plan: Continue her prudent nutritional plan and continue to advance exercise and cardiovascular fitness as tolerated.    Elevated blood pressure reading Assessment: Condition is Improving, but not optimized.. Labs were reviewed.  Last 3 blood pressure readings in our office are as follows: BP Readings from Last 3 Encounters:  11/03/22 130/85  09/29/22 133/74  08/13/22 122/81  She endorses that her elevated blood pressure reading is likely due to her  recent job related/family stress, not sleeping well, and consuming beverages with caffeine such as tea.   Plan: Continue with Prudent nutritional plan and low sodium diet, advance exercise as tolerated.   -I recommended her to check her blood pressure a couple of times a week and limit consumption of beverages with caffeine.   -I stressed the importance of getting enough sleep  for having healthy blood pressure readings.   TREATMENT PLAN FOR OBESITY: BMI 26.0-26.9,adult-current bmi 26.8 Morbid obesity (HCC)-start bmi 36.64/date 10/03/20 Assessment: Condition is Improving, but not optimized. Biometric data collected today, was reviewed with patient.  Fat mass has decreased by 1.8lb. Muscle mass has increased by .4lb. Total body water has increased by 26.2lb.   Plan: Continue with  Category 2 meal plan with extra [place] Yogurt.   Behavioral Intervention Additional resources provided today: patient declined Evidence-based  interventions for health behavior change were utilized today including the discussion of self monitoring techniques, problem-solving barriers and SMART goal setting techniques.   Regarding patient's less desirable eating habits and patterns, we employed the technique of small changes.  Pt will specifically work on: increase walking for next visit.    Recommended Physical Activity Goals Violetta has been advised to work up to 150 minutes of moderate intensity aerobic activity a week and strengthening exercises 2-3 times per week for cardiovascular health, weight loss maintenance and preservation of muscle mass.  She has agreed to Think about ways to increase physical activity  FOLLOW UP: Return in about 8 weeks (around 12/29/2022). She was informed of the importance of frequent follow up visits to maximize her success with intensive lifestyle modifications for her multiple health conditions.  Subjective:   Chief complaint: Obesity Daquisha is here to discuss her progress with her obesity treatment plan. She is on the the Category 2 Plan with extra [place] Yogurt and states she is following her eating plan approximately 90% of the time. She states she is exercising 50 minutes 6 days per week.  Interval History:  TASHAUN BYES is here for a follow up office visit. Since last office visit she has been eating better and avoiding the unhealthy foods that she enjoys. Last weekend, she ate off plan because it was her birthday. She got immediately back on track after that. She is doing pilates regularly, but stopped doing her nightly walking because she does not enjoy the current  weather.   We reviewed her meal plan and all questions were answered. Patient's food recall appears to be accurate and consistent with what is on plan when she is following it. When eating on plan, her hunger and cravings are well controlled.     Pharmacotherapy for weight loss: She is currently taking Metformin for medical  weight loss.  Denies side effects.    Review of Systems:  Pertinent positives were addressed with patient today.  Weight Summary and Biometrics   Weight Lost Since Last Visit: 1lb  No data recorded   Vitals Temp: 98.2 F (36.8 C) BP: 130/85 Pulse Rate: 63 SpO2: 100 %   Anthropometric Measurements Height: 5\' 6"  (1.676 m) Weight: 164 lb 6.4 oz (74.6 kg) BMI (Calculated): 26.55 Weight at Last Visit: 165lb Weight Lost Since Last Visit: 1lb Starting Weight: 227lb Total Weight Loss (lbs): 63 lb (28.6 kg) Peak Weight: 240lb   Body Composition  Body Fat %: 31.3 % Fat Mass (lbs): 51.4 lbs Muscle Mass (lbs): 107.2 lbs Total Body Water (lbs): 71.6 lbs Visceral Fat Rating : 7   Other Clinical Data Fasting: no Labs: no Today's Visit #: 25 Starting Date: 10/03/20    Objective:   PHYSICAL EXAM:  Blood pressure 130/85, pulse 63, temperature 98.2 F (36.8 C), height 5\' 6"  (1.676 m), weight 164 lb 6.4 oz (74.6 kg), SpO2 100 %. Body mass index is 26.53 kg/m.  General: Well Developed, well nourished, and in no acute distress.  HEENT: Normocephalic, atraumatic Skin: Warm and dry, cap RF less 2 sec, good turgor Chest:  Normal excursion, shape, no gross abn Respiratory: speaking in full sentences, no conversational dyspnea NeuroM-Sk: Ambulates w/o assistance, moves * 4 Psych: A and O *3, insight good, mood-full  DIAGNOSTIC DATA REVIEWED:  BMET    Component Value Date/Time   NA 140 06/03/2022 0941   K 4.2 06/03/2022 0941   CL 104 06/03/2022 0941   CO2 26 06/03/2022 0941   GLUCOSE 84 06/03/2022 0941   GLUCOSE 95 05/19/2021 0817   BUN 15 06/03/2022 0941   CREATININE 0.76 06/03/2022 0941   CALCIUM 9.0 06/03/2022 0941   GFRNONAA >60 05/19/2021 0817   GFRAA 94 10/03/2020 1213   Lab Results  Component Value Date   HGBA1C 5.2 06/03/2022   HGBA1C 5.4 10/03/2020   Lab Results  Component Value Date   INSULIN 4.8 06/03/2022   INSULIN 5.5 10/03/2020   Lab Results   Component Value Date   TSH 1.380 06/03/2022   CBC    Component Value Date/Time   WBC 5.1 06/03/2022 0941   WBC 8.4 05/19/2021 0802   RBC 4.10 06/03/2022 0941   RBC 3.97 05/19/2021 0802   HGB 13.4 06/03/2022 0941   HCT 41.7 06/03/2022 0941   PLT 195 06/03/2022 0941   MCV 102 (H) 06/03/2022 0941   MCH 32.7 06/03/2022 0941   MCH 33.8 05/19/2021 0802   MCHC 32.1 06/03/2022 0941   MCHC 33.4 05/19/2021 0802   RDW 12.0 06/03/2022 0941   Iron Studies    Component Value Date/Time   FERRITIN 234.7 07/03/2020 0953   Lipid Panel     Component Value Date/Time   CHOL 194 06/03/2022 0941   TRIG 67 06/03/2022 0941   HDL 72 06/03/2022 0941   CHOLHDL 2.5 11/24/2021 1024   CHOLHDL 3 07/03/2020 0953   VLDL 19.0 07/03/2020 0953   LDLCALC 110 (H) 06/03/2022 0941   Hepatic Function Panel     Component Value Date/Time   PROT 7.0 06/03/2022 0941   ALBUMIN 4.1 06/03/2022 0941   AST 23 06/03/2022 0941   ALT 21 06/03/2022 0941   ALKPHOS 99 06/03/2022 0941   BILITOT 0.6 06/03/2022 0941      Component Value Date/Time   TSH 1.380 06/03/2022 0941   Nutritional Lab Results  Component Value Date   VD25OH 55.1 06/03/2022   VD25OH 49.1 11/24/2021   VD25OH 50.8 07/31/2021    Attestations:   Reviewed by clinician on day of visit: allergies, medications, problem list, medical history, surgical history, family history, social history, and previous encounter notes.  I,Special Puri,acting as a Education administrator for [employer], DO.,have documented all relevant documentation on the behalf of Mellody Dance, DO,as directed by  Mellody Dance, DO while in the presence of Mellody Dance, DO.   I, Mellody Dance, DO, have reviewed all documentation for this visit. The documentation on 11/04/22 for the exam, diagnosis, procedures, and orders are all accurate and complete.

## 2022-11-13 ENCOUNTER — Other Ambulatory Visit: Payer: Self-pay | Admitting: Obstetrics and Gynecology

## 2022-11-13 DIAGNOSIS — Z1231 Encounter for screening mammogram for malignant neoplasm of breast: Secondary | ICD-10-CM

## 2022-12-17 ENCOUNTER — Ambulatory Visit (INDEPENDENT_AMBULATORY_CARE_PROVIDER_SITE_OTHER): Payer: BC Managed Care – PPO | Admitting: Family Medicine

## 2022-12-17 NOTE — Progress Notes (Incomplete)
Theresa Johnson, D.O.  ABFM, ABOM Specializing in Clinical Bariatric Medicine  Office located at: [address] [place], [place]  [address]     Assessment and Plan:   No orders of the defined types were placed in this encounter.   There are no discontinued medications.   No orders of the defined types were placed in this encounter.    *** There are no diagnoses linked to this encounter.     TREATMENT PLAN FOR OBESITY: Assessment:  Theresa Johnson is here to discuss her progress with her obesity treatment plan along with follow-up of her obesity related diagnoses. See Medical Weight Management Flowsheet for complete bioelectrical impedance results.  Condition is {docourse:29403:::1}. {BiometricData (Optional):29179}  Since last office visit on *** patient's Fat mass has {DID:29233} by ***lb. Muscle mass has {DID:29233} by ***lb. Total body water has {DID:29233} by ***lb.  Counseling done on how various foods will affect these numbers and how to maximize success  Current Nutrition Plan: ***  Current Activity Level consists of: ***  Today's visit was #: *** Starting weight: *** Starting date: *** Weight change since last visit: ***  (ie:   + 5 lb) Total lbs lost to date: *** Total weight loss percentage to date: *** ( use https://[email or web address].si )   Plan:  Theresa Johnson is currently in the action stage of change. As such, her goal is to continue her weight management plan. Theresa Johnson will work on healthier eating habits and try to follow the {mealplan:29239} best they can.   Behavioral Intervention Additional resources provided today: {weightresources:29185} Evidence-based interventions for health behavior change were utilized today including the discussion of self monitoring techniques, problem-solving barriers and SMART goal setting techniques.   Regarding patient's less desirable eating habits and patterns, we employed the  technique of small changes.  Pt will specifically work on: *** for next visit.    Recommended Physical Activity Goals  Theresa Johnson has been advised to slowly work up to 150 minutes of moderate intensity aerobic activity a week and strengthening exercises 2-3 times per week for cardiovascular health, weight loss maintenance and preservation of muscle mass.   She has agreed to {EMEXERCISE:28847}  Pharmacotherapy Current Anti-obesity medications: ***. Reported side effects: ***. We discussed various medication options to help Theresa Johnson with her weight loss efforts and we both agreed to ***.( or Patient prefers to not start any weight loss medications at this time.   FOLLOW UP: No follow-ups on file.*** She was informed of the importance of frequent follow up visits to maximize her success with intensive lifestyle modifications for her multiple health conditions.    Subjective:   Chief complaint: Obesity Theresa Johnson is here to discuss her progress with her obesity treatment plan. She is on the {MWMwtlossportion/plan2:23431} and states she is following her eating plan approximately ***% of the time. She states she is exercising *** minutes *** days per week.  Interval History:  Theresa Johnson is here for a follow up office visit.   This is patient's *** office visit with [place].    Since last office visit:  ***  We reviewed her meal plan and all questions were answered. Patient's food recall appears to be accurate and consistent with what is on plan when she is following it. When eating on plan, her hunger and cravings are well controlled.      Pharmacotherapy for weight loss: She {srtis (Optional):29129} currently taking {srtpreviousweightlossmeds (Optional):29124} for medical weight loss.  Denies side effects.  Review of Systems:  Pertinent positives were addressed with patient today.    Weight Summary and Biometrics   No data recorded No data recorded ***  No data recorded No  data recorded No data recorded No data recorded   Objective:   PHYSICAL EXAM: There were no vitals taken for this visit. There is no height or weight on file to calculate BMI.  General: Well Developed, well nourished, and in no acute distress.  HEENT: Normocephalic, atraumatic Skin: Warm and dry, cap RF less 2 sec, good turgor Chest:  Normal excursion, shape, no gross abn Respiratory: speaking in full sentences, no conversational dyspnea NeuroM-Sk: Ambulates w/o assistance, moves * 4 Psych: A and O *3, insight good, mood-full  DIAGNOSTIC DATA REVIEWED:  BMET    Component Value Date/Time   NA 140 06/03/2022 0941   K 4.2 06/03/2022 0941   CL 104 06/03/2022 0941   CO2 26 06/03/2022 0941   GLUCOSE 84 06/03/2022 0941   GLUCOSE 95 05/19/2021 0817   BUN 15 06/03/2022 0941   CREATININE 0.76 06/03/2022 0941   CALCIUM 9.0 06/03/2022 0941   GFRNONAA >60 05/19/2021 0817   GFRAA 94 10/03/2020 1213   Lab Results  Component Value Date   HGBA1C 5.2 06/03/2022   HGBA1C 5.4 10/03/2020   Lab Results  Component Value Date   INSULIN 4.8 06/03/2022   INSULIN 5.5 10/03/2020   Lab Results  Component Value Date   TSH 1.380 06/03/2022   CBC    Component Value Date/Time   WBC 5.1 06/03/2022 0941   WBC 8.4 05/19/2021 0802   RBC 4.10 06/03/2022 0941   RBC 3.97 05/19/2021 0802   HGB 13.4 06/03/2022 0941   HCT 41.7 06/03/2022 0941   PLT 195 06/03/2022 0941   MCV 102 (H) 06/03/2022 0941   MCH 32.7 06/03/2022 0941   MCH 33.8 05/19/2021 0802   MCHC 32.1 06/03/2022 0941   MCHC 33.4 05/19/2021 0802   RDW 12.0 06/03/2022 0941   Iron Studies    Component Value Date/Time   FERRITIN 234.7 07/03/2020 0953   Lipid Panel     Component Value Date/Time   CHOL 194 06/03/2022 0941   TRIG 67 06/03/2022 0941   HDL 72 06/03/2022 0941   CHOLHDL 2.5 11/24/2021 1024   CHOLHDL 3 07/03/2020 0953   VLDL 19.0 07/03/2020 0953   LDLCALC 110 (H) 06/03/2022 0941   Hepatic Function Panel      Component Value Date/Time   PROT 7.0 06/03/2022 0941   ALBUMIN 4.1 06/03/2022 0941   AST 23 06/03/2022 0941   ALT 21 06/03/2022 0941   ALKPHOS 99 06/03/2022 0941   BILITOT 0.6 06/03/2022 0941      Component Value Date/Time   TSH 1.380 06/03/2022 0941   Nutritional Lab Results  Component Value Date   VD25OH 55.1 06/03/2022   VD25OH 49.1 11/24/2021   VD25OH 50.8 07/31/2021    Attestations:   Reviewed by clinician on day of visit: allergies, medications, problem list, medical history, surgical history, family history, social history, and previous encounter notes.   Patient was in the office today and time spent on visit including pre-visit chart review and post-visit care/coordination of care and electronic medical record documentation was *** minutes. 50% of the time was in face to face counseling of this patient's medical condition(s) and providing education on treatment options to include the first-line treatment of diet and lifestyle modification.   I,Special Puri,acting as a Neurosurgeon for [employer], DO.,have documented all relevant  documentation on the behalf of Thomasene Lot, DO,as directed by  Thomasene Lot, DO while in the presence of Thomasene Lot, DO.   I, Thomasene Lot, DO, have reviewed all documentation for this visit. The documentation on 12/17/22 for the exam, diagnosis, procedures, and orders are all accurate and complete.

## 2022-12-28 ENCOUNTER — Ambulatory Visit
Admission: RE | Admit: 2022-12-28 | Discharge: 2022-12-28 | Disposition: A | Discharge status: Home / Self Care | Payer: BC Managed Care – PPO | Source: Ambulatory Visit | Attending: Obstetrics and Gynecology | Admitting: Obstetrics and Gynecology

## 2022-12-28 DIAGNOSIS — Z1231 Encounter for screening mammogram for malignant neoplasm of breast: Secondary | ICD-10-CM

## 2022-12-31 ENCOUNTER — Other Ambulatory Visit (INDEPENDENT_AMBULATORY_CARE_PROVIDER_SITE_OTHER): Payer: Self-pay | Admitting: Family Medicine

## 2022-12-31 DIAGNOSIS — E88819 Insulin resistance, unspecified: Secondary | ICD-10-CM | POA: Diagnosis not present

## 2022-12-31 DIAGNOSIS — E559 Vitamin D deficiency, unspecified: Secondary | ICD-10-CM

## 2022-12-31 DIAGNOSIS — E7849 Other hyperlipidemia: Secondary | ICD-10-CM | POA: Diagnosis not present

## 2023-01-01 LAB — COMPREHENSIVE METABOLIC PANEL
ALT: 17 IU/L (ref 0–32)
AST: 22 IU/L (ref 0–40)
Albumin/Globulin Ratio: 1.8 (ref 1.2–2.2)
Albumin: 4.1 g/dL (ref 3.8–4.9)
Alkaline Phosphatase: 94 IU/L (ref 44–121)
BUN/Creatinine Ratio: 15 (ref 9–23)
BUN: 12 mg/dL (ref 6–24)
Bilirubin Total: 0.9 mg/dL (ref 0.0–1.2)
CO2: 23 mmol/L (ref 20–29)
Calcium: 8.8 mg/dL (ref 8.7–10.2)
Chloride: 104 mmol/L (ref 96–106)
Creatinine, Ser: 0.81 mg/dL (ref 0.57–1.00)
Globulin, Total: 2.3 g/dL (ref 1.5–4.5)
Glucose: 77 mg/dL (ref 70–99)
Potassium: 4 mmol/L (ref 3.5–5.2)
Sodium: 140 mmol/L (ref 134–144)
Total Protein: 6.4 g/dL (ref 6.0–8.5)
eGFR: 86 mL/min/{1.73_m2} (ref >59)

## 2023-01-01 LAB — INSULIN, RANDOM: INSULIN: 2.7 u[IU]/mL (ref 2.6–24.9)

## 2023-01-01 LAB — VITAMIN D 25 HYDROXY (VIT D DEFICIENCY, FRACTURES): Vit D, 25-Hydroxy: 52 ng/mL (ref 30.0–100.0)

## 2023-01-01 LAB — LIPID PANEL WITH LDL/HDL RATIO
Cholesterol, Total: 194 mg/dL (ref 100–199)
HDL: 78 mg/dL (ref >39)
LDL Chol Calc (NIH): 105 mg/dL — ABNORMAL HIGH (ref 0–99)
LDL/HDL Ratio: 1.3 ratio (ref 0.0–3.2)
Triglycerides: 59 mg/dL (ref 0–149)
VLDL Cholesterol Cal: 11 mg/dL (ref 5–40)

## 2023-01-04 ENCOUNTER — Encounter (INDEPENDENT_AMBULATORY_CARE_PROVIDER_SITE_OTHER): Payer: Self-pay

## 2023-01-05 ENCOUNTER — Ambulatory Visit (INDEPENDENT_AMBULATORY_CARE_PROVIDER_SITE_OTHER): Payer: BC Managed Care – PPO | Admitting: Family Medicine

## 2023-01-07 ENCOUNTER — Ambulatory Visit (INDEPENDENT_AMBULATORY_CARE_PROVIDER_SITE_OTHER): Payer: BC Managed Care – PPO | Admitting: Family Medicine

## 2023-01-19 ENCOUNTER — Encounter (INDEPENDENT_AMBULATORY_CARE_PROVIDER_SITE_OTHER): Payer: Self-pay | Admitting: Family Medicine

## 2023-01-19 ENCOUNTER — Ambulatory Visit (INDEPENDENT_AMBULATORY_CARE_PROVIDER_SITE_OTHER): Payer: BC Managed Care – PPO | Admitting: Family Medicine

## 2023-01-19 VITALS — BP 107/69 | HR 65 | Temp 98.7°F | Ht 66.0 in | Wt 165.0 lb

## 2023-01-19 DIAGNOSIS — Z6826 Body mass index (BMI) 26.0-26.9, adult: Secondary | ICD-10-CM

## 2023-01-19 DIAGNOSIS — E88819 Insulin resistance, unspecified: Secondary | ICD-10-CM

## 2023-01-19 DIAGNOSIS — E7849 Other hyperlipidemia: Secondary | ICD-10-CM

## 2023-01-19 DIAGNOSIS — E559 Vitamin D deficiency, unspecified: Secondary | ICD-10-CM | POA: Diagnosis not present

## 2023-01-19 MED ORDER — VITAMIN D (ERGOCALCIFEROL) 1.25 MG (50000 UNIT) PO CAPS
50000.0000 [IU] | ORAL_CAPSULE | ORAL | 0 refills | Status: DC
Start: 2023-01-19 — End: 2023-03-16

## 2023-01-19 MED ORDER — METFORMIN HCL 500 MG PO TABS: ORAL_TABLET | ORAL | 0 refills | Status: DC

## 2023-01-19 NOTE — Progress Notes (Signed)
Theresa Johnson, D.O.  ABFM, ABOM Specializing in Clinical Bariatric Medicine  Office located at: [address] [place], [place]  [address]     Assessment and Plan:   Medications Discontinued During This Encounter  Medication Reason   Vitamin D, Ergocalciferol, (DRISDOL) 1.25 MG (50000 UNIT) CAPS capsule Reorder   metFORMIN (GLUCOPHAGE) 500 MG tablet Reorder     Meds ordered this encounter  Medications   Vitamin D, Ergocalciferol, (DRISDOL) 1.25 MG (50000 UNIT) CAPS capsule    Sig: Take 1 capsule (50,000 Units total) by mouth every 7 (seven) days.    Dispense:  12 capsule    Refill:  0    90 d supply;  ** OV for RF **   Do not send RF request   metFORMIN (GLUCOPHAGE) 500 MG tablet    Sig: 1/2 tab in am and 1/2 tab in pm    Dispense:  90 tablet    Refill:  0    90 d supply;  ** OV for RF **   Do not send RF request     Insulin resistance Assessment: Labs below indicate that: Her Insulin levels are within normal limits. Her kidney and liver function are normal. Labs also suggest that she is well-hydrated. Patient reports taking a half tab of Metformin once daily. She mostly denies having any issues with hunger/cravings. She reports sometimes feeling hungry in the morning when it is time for breakfast. Labs were reviewed with pt today and all questions were answered about them.    Lab Results  Component Value Date   HGBA1C 5.2 06/03/2022   HGBA1C 5.3 11/24/2021   HGBA1C 5.5 07/31/2021   INSULIN 2.7 12/31/2022   INSULIN 4.8 06/03/2022   INSULIN 3.1 11/24/2021   Lab Results  Component Value Date   CREATININE 0.81 12/31/2022   BUN 12 12/31/2022   NA 140 12/31/2022   K 4.0 12/31/2022   CL 104 12/31/2022   CO2 23 12/31/2022      Component Value Date/Time   PROT 6.4 12/31/2022 1023   ALBUMIN 4.1 12/31/2022 1023   AST 22 12/31/2022 1023   ALT 17 12/31/2022 1023   ALKPHOS 94 12/31/2022 1023   BILITOT 0.9 12/31/2022 1023     Plan: I again reminded Theresa Johnson  about the role of Metformin in the management of this condition, per her request.  Handouts given after discussion had.  Continue with medication. Will refill this today. I advised patient that she can increase her Metformin to 1/2 tab BID if she struggles with any hunger/cravings.  Theresa Johnson will continue to work on weight loss, exercise, via their meal plan we devised to help decrease the risk of progressing to diabetes. We will recheck A1c and fasting insulin level in approximately 3 months from last check, or as deemed appropriate.      Vitamin D deficiency Assessment: Condition is at goal. Labs below indicate that her Vitamin D levels are within the recommended normal limits of 50-80. No issues with Ergocalciferol 50K IU weekly. Denies any adverse effects. Labs were reviewed with pt today and all questions were answered about them.    Lab Results  Component Value Date   VD25OH 52.0 12/31/2022   VD25OH 55.1 06/03/2022   VD25OH 49.1 11/24/2021   Plan: Continue with Ergocalciferol 50K IU weekly. Will reorder this today.  Will continue to monitor levels regularly (every 3-4 mo on average) to keep levels within normal limits and prevent over supplementation.  Other hyperlipidemia Assessment: Condition is improving. Labs indicate that: Her HDL levels are at goal and > 38. Her LDL levels have improved from 110 on 06/03/22 to 105 on 12/31/2022. Pt is not taking any cholesterol medication, this condition is diet/exercise controlled. Labs were reviewed with pt today and all questions were answered about them.    Lab Results  Component Value Date   CHOL 194 12/31/2022   HDL 78 12/31/2022   LDLCALC 105 (H) 12/31/2022   TRIG 59 12/31/2022   CHOLHDL 2.5 11/24/2021   Plan:  I again stressed the importance of continuing our prudent nutritional plan that is low in saturated and trans fats, and low in fatty carbs to further optimize these numbers. Theresa Johnson agrees to continue with our  treatment plan of a heart-heathy, low cholesterol meal plan.  We will continue routine screening as patient continues to achieve health goals along their weight loss journey.  - She will continue with her excellent exercise regimen which also helps her chol levels    TREATMENT PLAN FOR OBESITY: BMI 26.0-26.9,adult-current bmi 26.64 Morbid obesity (HCC)-start bmi 36.64/date 10/03/20 Assessment: Theresa Johnson is here to discuss her progress with her obesity treatment plan along with follow-up of her obesity related diagnoses. See Medical Weight Management Flowsheet for complete bioelectrical impedance results.  Condition is stable. Biometric data collected today, was reviewed with patient.   Since last office visit on 11/03/22 patient's  Muscle mass has increased by 0.2 lb. Fat mass has increased by 1 lb. Total body water has increased by 5 lb.  Counseling done on how various foods will affect these numbers and how to maximize success  Total lbs lost to date: 65 Total weight loss percentage to date: 27.31  Plan: Continue with the Category 2 meal plan with an extra [place] Yogurt.   Behavioral Intervention Additional resources provided today:  Metformin Handout Evidence-based interventions for health behavior change were utilized today including the discussion of self monitoring techniques, problem-solving barriers and SMART goal setting techniques.   Regarding patient's less desirable eating habits and patterns, we employed the technique of small changes.  Pt will specifically work on: continue with prescribed meal plan and current exercise regiment for next visit.    Recommended Physical Activity Goals  Theresa Johnson has been advised to slowly work up to 150 minutes of moderate intensity aerobic activity a week and strengthening exercises 2-3 times per week for cardiovascular health, weight loss maintenance and preservation of muscle mass.   She has agreed to Continue current level of  physical activity   FOLLOW UP: Return in about 8 weeks (around 03/16/2023). She was informed of the importance of frequent follow up visits to maximize her success with intensive lifestyle modifications for her multiple health conditions.   Subjective:   Chief complaint: Obesity Theresa Johnson is here to discuss her progress with her obesity treatment plan. She is on the the Category 2 Plan with extra [place] Yogurt and states she is following her eating plan approximately 80 % of the time. -   She states she is doing pilates- 50-60 minutes, 6-7 days per week. She also walks the dog 2-3 miles per day, weather permitting.   Interval History:  Theresa Johnson is here for a follow up office visit.     Since last office visit:   - Theresa Johnson has been doing well with her exercise regiment and meal plan.  - Apart from doing Pilates, she is also walking her dog 2-3 miles   -  She ate off plan on a few occasions, but mostly adhered to her prescribed plan.   - Her sleep has also been improving (she is sleeping 7-9 hrs daily). She also takes Magnesium glycinate 400 mg daily to relax at night which helps.  No constipation,  great water intake.    Pharmacotherapy for weight loss: She is currently taking  Metformin  for medical weight loss.  Denies side effects.    Review of Systems:  Pertinent positives were addressed with patient today.  Weight Summary and Biometrics   Weight Lost Since Last Visit: 0  Weight Gained Since Last Visit: 1lb    Vitals Temp: 98.7 F (37.1 C) BP: 107/69 Pulse Rate: 65 SpO2: 97 %   Anthropometric Measurements Height: 5\' 6"  (1.676 m) Weight: 165 lb (74.8 kg) BMI (Calculated): 26.64 Weight at Last Visit: 164lb Weight Lost Since Last Visit: 0 Weight Gained Since Last Visit: 1lb Starting Weight: 227lb Total Weight Loss (lbs): 62 lb (28.1 kg) Peak Weight: 227lb   Body Composition  Body Fat %: 31.7 % Fat Mass (lbs): 52.4 lbs Muscle Mass (lbs): 107.4  lbs Total Body Water (lbs): 76.6 lbs Visceral Fat Rating : 7   Other Clinical Data Today's Visit #: 26 Starting Date: 10/03/20     Objective:   PHYSICAL EXAM: Blood pressure 107/69, pulse 65, temperature 98.7 F (37.1 C), height 5\' 6"  (1.676 m), weight 165 lb (74.8 kg), SpO2 97 %. Body mass index is 26.63 kg/m.  General: Well Developed, well nourished, and in no acute distress.  HEENT: Normocephalic, atraumatic Skin: Warm and dry, cap RF less 2 sec, good turgor Chest:  Normal excursion, shape, no gross abn Respiratory: speaking in full sentences, no conversational dyspnea NeuroM-Sk: Ambulates w/o assistance, moves * 4 Psych: A and O *3, insight good, mood-full  DIAGNOSTIC DATA REVIEWED:  BMET    Component Value Date/Time   NA 140 12/31/2022 1023   K 4.0 12/31/2022 1023   CL 104 12/31/2022 1023   CO2 23 12/31/2022 1023   GLUCOSE 77 12/31/2022 1023   GLUCOSE 95 05/19/2021 0817   BUN 12 12/31/2022 1023   CREATININE 0.81 12/31/2022 1023   CALCIUM 8.8 12/31/2022 1023   GFRNONAA >60 05/19/2021 0817   GFRAA 94 10/03/2020 1213   Lab Results  Component Value Date   HGBA1C 5.2 06/03/2022   HGBA1C 5.4 10/03/2020   Lab Results  Component Value Date   INSULIN 2.7 12/31/2022   INSULIN 5.5 10/03/2020   Lab Results  Component Value Date   TSH 1.380 06/03/2022   CBC    Component Value Date/Time   WBC 5.1 06/03/2022 0941   WBC 8.4 05/19/2021 0802   RBC 4.10 06/03/2022 0941   RBC 3.97 05/19/2021 0802   HGB 13.4 06/03/2022 0941   HCT 41.7 06/03/2022 0941   PLT 195 06/03/2022 0941   MCV 102 (H) 06/03/2022 0941   MCH 32.7 06/03/2022 0941   MCH 33.8 05/19/2021 0802   MCHC 32.1 06/03/2022 0941   MCHC 33.4 05/19/2021 0802   RDW 12.0 06/03/2022 0941   Iron Studies    Component Value Date/Time   FERRITIN 234.7 07/03/2020 0953   Lipid Panel     Component Value Date/Time   CHOL 194 12/31/2022 1023   TRIG 59 12/31/2022 1023   HDL 78 12/31/2022 1023   CHOLHDL  2.5 11/24/2021 1024   CHOLHDL 3 07/03/2020 0953   VLDL 19.0 07/03/2020 0953   LDLCALC 105 (H) 12/31/2022 1023   Hepatic  Function Panel     Component Value Date/Time   PROT 6.4 12/31/2022 1023   ALBUMIN 4.1 12/31/2022 1023   AST 22 12/31/2022 1023   ALT 17 12/31/2022 1023   ALKPHOS 94 12/31/2022 1023   BILITOT 0.9 12/31/2022 1023      Component Value Date/Time   TSH 1.380 06/03/2022 0941   Nutritional Lab Results  Component Value Date   VD25OH 52.0 12/31/2022   VD25OH 55.1 06/03/2022   VD25OH 49.1 11/24/2021    Attestations:   Reviewed by clinician on day of visit: allergies, medications, problem list, medical history, surgical history, family history, social history, and previous encounter notes.Reviewed by clinician on day of visit: allergies, medications, problem list, medical history, surgical history, family history, social history, and previous encounter notes pertinent to patient's obesity diagnosis.    This encounter total 40 minutes of time including time coordinating the above treatment plan, any pre-visit chart review and post-visit documentation and reviewing any labs and/or imaging, reviewing pertinent prior notes, and providing counseling the patient about such treatment plan.  OVER 50% of this time was spent in direct, face-to-face counseling and coordination of care.   I, Special Randolm Idol, acting as a Stage manager for [employer], DO., have compiled all relevant documentation for today's office visit on behalf of Thomasene Lot, DO, while in the presence of [employer], DO.   I have reviewed the above documentation for accuracy and completeness, and I agree with the above. Theresa Johnson, D.O.   The 21st Century Cures Act was signed into law in 2016 which includes the topic of electronic health records.  This provides immediate access to information in MyChart.  This includes consultation notes, operative notes, office notes, lab results and pathology  reports.  If you have any questions about what you read please let us know at your next visit so we can discuss your concerns and take corrective action if need be.  We are right here with you.

## 2023-01-19 NOTE — Patient Instructions (Signed)
The 10-year ASCVD risk score (Arnett DK, et al., 2019) is: 1%   Values used to calculate the score:     Age: 52 years     Sex: Female     Is Non-Hispanic African American: Yes     Diabetic: No     Tobacco smoker: No     Systolic Blood Pressure: 107 mmHg     Is BP treated: No     HDL Cholesterol: 78 mg/dL     Total Cholesterol: 194 mg/dL

## 2023-03-16 ENCOUNTER — Ambulatory Visit (INDEPENDENT_AMBULATORY_CARE_PROVIDER_SITE_OTHER): Payer: BC Managed Care – PPO | Admitting: Family Medicine

## 2023-03-16 ENCOUNTER — Encounter (INDEPENDENT_AMBULATORY_CARE_PROVIDER_SITE_OTHER): Payer: Self-pay | Admitting: Family Medicine

## 2023-03-16 VITALS — BP 109/74 | HR 67 | Temp 98.2°F | Ht 66.0 in | Wt 165.0 lb

## 2023-03-16 DIAGNOSIS — F4320 Adjustment disorder, unspecified: Secondary | ICD-10-CM | POA: Diagnosis not present

## 2023-03-16 DIAGNOSIS — E88819 Insulin resistance, unspecified: Secondary | ICD-10-CM | POA: Diagnosis not present

## 2023-03-16 DIAGNOSIS — Z6826 Body mass index (BMI) 26.0-26.9, adult: Secondary | ICD-10-CM

## 2023-03-16 DIAGNOSIS — E559 Vitamin D deficiency, unspecified: Secondary | ICD-10-CM | POA: Diagnosis not present

## 2023-03-16 DIAGNOSIS — E7849 Other hyperlipidemia: Secondary | ICD-10-CM | POA: Diagnosis not present

## 2023-03-16 MED ORDER — VITAMIN D (ERGOCALCIFEROL) 1.25 MG (50000 UNIT) PO CAPS
50000.0000 [IU] | ORAL_CAPSULE | ORAL | 0 refills | Status: DC
Start: 2023-03-16 — End: 2023-05-25

## 2023-03-16 NOTE — Progress Notes (Signed)
Theresa Johnson, D.O.  ABFM, ABOM Specializing in Clinical Bariatric Medicine  Office located at: [address] [place], [place]  [address]     Assessment and Plan:   Orders Placed This Encounter  Procedures   CT CARDIAC SCORING (SELF PAY ONLY)    Medications Discontinued During This Encounter  Medication Reason   Vitamin D, Ergocalciferol, (DRISDOL) 1.25 MG (50000 UNIT) CAPS capsule Reorder     Meds ordered this encounter  Medications   Vitamin D, Ergocalciferol, (DRISDOL) 1.25 MG (50000 UNIT) CAPS capsule    Sig: Take 1 capsule (50,000 Units total) by mouth every 7 (seven) days.    Dispense:  12 capsule    Refill:  0    90 d supply;  ** OV for RF **   Do not send RF request     Come in for fasting blood work 2-3 days prior to next OV and come in 30 minutes early on next OV to obtain repeat IC    Insulin resistance Assessment & Plan:  Condition is stable and is being treated with Metformin 500 mg 0.5 tab in am and 0.5 tab in pm - denies any N/V/D. Hunger and cravings are pretty well controlled.   Lab Results  Component Value Date   HGBA1C 5.2 06/03/2022   HGBA1C 5.3 11/24/2021   HGBA1C 5.5 07/31/2021   INSULIN 2.7 12/31/2022   INSULIN 4.8 06/03/2022   INSULIN 3.1 11/24/2021    Pt advised to maintain with Metformin at current dose.Continue to decrease simple carbs/ sugars; increase fiber and proteins -> follow her meal plan. Will continue to monitor condition alongside PCP/specialists as it relates to their weight loss journey    Vitamin D deficiency Assessment & Plan: Condition is being treated with ERGO 50,000 units wkly and a daily Multivitamin with minerals.   Lab Results  Component Value Date   VD25OH 52.0 12/31/2022   VD25OH 55.1 06/03/2022   VD25OH 49.1 11/24/2021   She will c/w with both supplements - Will refill ERGO today.  Will continue to monitor condition and recheck levels as deemed appropriate.    Other hyperlipidemia Assessment &  Plan: No current meds. Diet/exercise approach. LDL is slightly elevated at 105. ASCVD score of 1.1% is assuring, no need for statin therapy.   Lab Results  Component Value Date   CHOL 194 12/31/2022   HDL 78 12/31/2022   LDLCALC 105 (H) 12/31/2022   TRIG 59 12/31/2022   CHOLHDL 2.5 11/24/2021   The 10-year ASCVD risk score (Arnett DK, et al., 2019) is: 1.1%   Values used to calculate the score:     Age: 52 years     Sex: Female     Is Non-Hispanic African American: Yes     Diabetic: No     Tobacco smoker: No     Systolic Blood Pressure: 109 mmHg     Is BP treated: No     HDL Cholesterol: 78 mg/dL     Total Cholesterol: 194 mg/dL   CT Cardiac scoring ordered today. Continue with our heart-healthy, low cholesterol meal meal plan. We will continue routine screening.    Adjustment disorder with caregiver stress   TREATMENT PLAN FOR OBESITY: BMI 26.0-26.9,adult-current bmi 26.64 Morbid obesity (HCC)-start bmi 36.64/date 10/03/20 Assessment & Plan: Theresa Johnson is here to discuss her progress with her obesity treatment plan along with follow-up of her obesity related diagnoses. See Medical Weight Management Flowsheet for complete bioelectrical impedance results.  Condition  is not optimized. Biometric data collected today, was reviewed with patient.   Since last office visit on 01/19/23 patient's  Muscle mass has decreased by 0.6 lb. Fat mass has increased by 0.8 lb. Total body water has decreased by 1 lb.  Counseling done on how various foods will affect these numbers and how to maximize success  Total lbs lost to date: 62 lbs  Total weight loss percentage to date: 27.31%   Continue with the Category 2 meal plan with an extra [place] Yogurt.   Behavioral Intervention Additional resources provided today:  Adult Activity Handouts & Healthy Eating Principles Handout Evidence-based interventions for health behavior change were utilized today including the discussion of self  monitoring techniques, problem-solving barriers and SMART goal setting techniques.   Regarding patient's less desirable eating habits and patterns, we employed the technique of small changes.  Pt will specifically work on: exploring the [employer] and engaging in creative processes for next visit.    Recommended Physical Activity Goals  Theresa Johnson has been advised to slowly work up to 150 minutes of moderate intensity aerobic activity a week and strengthening exercises 2-3 times per week for cardiovascular health, weight loss maintenance and preservation of muscle mass. She has agreed to Continue current level of physical activity   FOLLOW UP: Return in about 2 months (around 05/16/2023). She was informed of the importance of frequent follow up visits to maximize her success with intensive lifestyle modifications for her multiple health conditions.  Subjective:   Chief complaint: Obesity [name] is here to discuss her progress with her obesity treatment plan. She is on the Category 2 Plan  with an extra [place] Yogurt and states she is following her eating plan approximately 80% of the time. She states she is doing pilates 50 minutes 7 days per week.  Interval History:  Theresa Johnson is here for a follow up office visit. Since last OV, [name] has been doing well. Reports doing meditation especially when life gets stressful. Reports sometimes eating off-plan foods, but is mostly adhering to the meal plan. No complaints with hunger/cravings. Reports that she rarely drinking alcoholic beverages.   Pharmacotherapy for weight loss: She is currently taking  Metformin 500 mg 1/2 tab in am and 1/2 tab in pm  for medical weight loss.  Denies side effects.    Review of Systems:  Pertinent positives were addressed with patient today.  Reviewed by clinician on day of visit: allergies, medications, problem list, medical history, surgical history, family history, social history, and previous  encounter notes.  Weight Summary and Biometrics   Weight Lost Since Last Visit: 0  Weight Gained Since Last Visit: 0   Vitals Temp: 98.2 F (36.8 C) BP: 109/74 Pulse Rate: 67 SpO2: 100 %   Anthropometric Measurements Height: 5\' 6"  (1.676 m) Weight: 165 lb (74.8 kg) BMI (Calculated): 26.64 Weight at Last Visit: 165lb Weight Lost Since Last Visit: 0 Weight Gained Since Last Visit: 0 Starting Weight: 227lb Total Weight Loss (lbs): 62 lb (28.1 kg) Peak Weight: 227lb   Body Composition  Body Fat %: 32.1 % Fat Mass (lbs): 53.2 lbs Muscle Mass (lbs): 106.8 lbs Total Body Water (lbs): 75.6 lbs Visceral Fat Rating : 7   Other Clinical Data Fasting: no Labs: no Today's Visit #: 27 Starting Date: 10/03/20   Objective:   PHYSICAL EXAM: Blood pressure 109/74, pulse 67, temperature 98.2 F (36.8 C), height 5\' 6"  (1.676 m), weight 165 lb (74.8 kg), SpO2 100%. Body mass  index is 26.63 kg/m.  General: Well Developed, well nourished, and in no acute distress.  HEENT: Normocephalic, atraumatic Skin: Warm and dry, cap RF less 2 sec, good turgor Chest:  Normal excursion, shape, no gross abn Respiratory: speaking in full sentences, no conversational dyspnea NeuroM-Sk: Ambulates w/o assistance, moves * 4 Psych: A and O *3, insight good, mood-full  DIAGNOSTIC DATA REVIEWED:  BMET    Component Value Date/Time   NA 140 12/31/2022 1023   K 4.0 12/31/2022 1023   CL 104 12/31/2022 1023   CO2 23 12/31/2022 1023   GLUCOSE 77 12/31/2022 1023   GLUCOSE 95 05/19/2021 0817   BUN 12 12/31/2022 1023   CREATININE 0.81 12/31/2022 1023   CALCIUM 8.8 12/31/2022 1023   GFRNONAA >60 05/19/2021 0817   GFRAA 94 10/03/2020 1213   Lab Results  Component Value Date   HGBA1C 5.2 06/03/2022   HGBA1C 5.4 10/03/2020   Lab Results  Component Value Date   INSULIN 2.7 12/31/2022   INSULIN 5.5 10/03/2020   Lab Results  Component Value Date   TSH 1.380 06/03/2022   CBC    Component  Value Date/Time   WBC 5.1 06/03/2022 0941   WBC 8.4 05/19/2021 0802   RBC 4.10 06/03/2022 0941   RBC 3.97 05/19/2021 0802   HGB 13.4 06/03/2022 0941   HCT 41.7 06/03/2022 0941   PLT 195 06/03/2022 0941   MCV 102 (H) 06/03/2022 0941   MCH 32.7 06/03/2022 0941   MCH 33.8 05/19/2021 0802   MCHC 32.1 06/03/2022 0941   MCHC 33.4 05/19/2021 0802   RDW 12.0 06/03/2022 0941   Iron Studies    Component Value Date/Time   FERRITIN 234.7 07/03/2020 0953   Lipid Panel     Component Value Date/Time   CHOL 194 12/31/2022 1023   TRIG 59 12/31/2022 1023   HDL 78 12/31/2022 1023   CHOLHDL 2.5 11/24/2021 1024   CHOLHDL 3 07/03/2020 0953   VLDL 19.0 07/03/2020 0953   LDLCALC 105 (H) 12/31/2022 1023   Hepatic Function Panel     Component Value Date/Time   PROT 6.4 12/31/2022 1023   ALBUMIN 4.1 12/31/2022 1023   AST 22 12/31/2022 1023   ALT 17 12/31/2022 1023   ALKPHOS 94 12/31/2022 1023   BILITOT 0.9 12/31/2022 1023      Component Value Date/Time   TSH 1.380 06/03/2022 0941   Nutritional Lab Results  Component Value Date   VD25OH 52.0 12/31/2022   VD25OH 55.1 06/03/2022   VD25OH 49.1 11/24/2021    Attestations:   Patient was in the office today and time spent on visit including pre-visit chart review and post-visit care/coordination of care and electronic medical record documentation was 40 minutes. 50% of the time was in face to face counseling of this patient's medical condition(s) and providing education on treatment options to include the first-line treatment of diet and lifestyle modification.   I, Special Randolm Idol , acting as a Stage manager for [employer], DO., have compiled all relevant documentation for today's office visit on behalf of Thomasene Lot, DO, while in the presence of [employer], DO.  I have reviewed the above documentation for accuracy and completeness, and I agree with the above. Theresa Johnson, D.O.  The 21st Century Cures Act was signed  into law in 2016 which includes the topic of electronic health records.  This provides immediate access to information in MyChart.  This includes consultation notes, operative notes, office notes, lab results and pathology  reports.  If you have any questions about what you read please let us know at your next visit so we can discuss your concerns and take corrective action if need be.  We are right here with you.

## 2023-03-20 ENCOUNTER — Encounter (INDEPENDENT_AMBULATORY_CARE_PROVIDER_SITE_OTHER): Payer: Self-pay | Admitting: Family Medicine

## 2023-04-15 ENCOUNTER — Ambulatory Visit (HOSPITAL_BASED_OUTPATIENT_CLINIC_OR_DEPARTMENT_OTHER)
Admission: RE | Admit: 2023-04-15 | Discharge: 2023-04-15 | Disposition: A | Discharge status: Home / Self Care | Payer: Self-pay | Source: Ambulatory Visit | Attending: Family Medicine | Admitting: Family Medicine

## 2023-04-15 DIAGNOSIS — E88819 Insulin resistance, unspecified: Secondary | ICD-10-CM

## 2023-04-15 DIAGNOSIS — E7849 Other hyperlipidemia: Secondary | ICD-10-CM

## 2023-04-15 DIAGNOSIS — Z6826 Body mass index (BMI) 26.0-26.9, adult: Secondary | ICD-10-CM

## 2023-05-04 DIAGNOSIS — Z01419 Encounter for gynecological examination (general) (routine) without abnormal findings: Secondary | ICD-10-CM | POA: Diagnosis not present

## 2023-05-09 ENCOUNTER — Other Ambulatory Visit (INDEPENDENT_AMBULATORY_CARE_PROVIDER_SITE_OTHER): Payer: Self-pay | Admitting: Family Medicine

## 2023-05-09 DIAGNOSIS — E88819 Insulin resistance, unspecified: Secondary | ICD-10-CM

## 2023-05-12 ENCOUNTER — Other Ambulatory Visit (HOSPITAL_BASED_OUTPATIENT_CLINIC_OR_DEPARTMENT_OTHER): Payer: Self-pay

## 2023-05-12 MED ORDER — COVID-19 MRNA VAC-TRIS(PFIZER) 30 MCG/0.3ML IM SUSY
0.3000 mL | PREFILLED_SYRINGE | Freq: Once | INTRAMUSCULAR | 0 refills | Status: AC
  Filled 2023-05-12: qty 0.3, 1d supply, fill #0

## 2023-05-12 MED ORDER — INFLUENZA VIRUS VACC SPLIT PF (FLUZONE) 0.5 ML IM SUSY
0.5000 mL | PREFILLED_SYRINGE | Freq: Once | INTRAMUSCULAR | 0 refills | Status: AC
  Filled 2023-05-12: qty 0.5, 1d supply, fill #0

## 2023-05-18 ENCOUNTER — Ambulatory Visit (INDEPENDENT_AMBULATORY_CARE_PROVIDER_SITE_OTHER): Payer: BC Managed Care – PPO | Admitting: Family Medicine

## 2023-05-25 ENCOUNTER — Ambulatory Visit (INDEPENDENT_AMBULATORY_CARE_PROVIDER_SITE_OTHER): Payer: BC Managed Care – PPO | Admitting: Family Medicine

## 2023-05-25 ENCOUNTER — Encounter (INDEPENDENT_AMBULATORY_CARE_PROVIDER_SITE_OTHER): Payer: Self-pay | Admitting: Family Medicine

## 2023-05-25 VITALS — BP 121/79 | HR 62 | Temp 99.1°F | Ht 66.0 in | Wt 165.0 lb

## 2023-05-25 DIAGNOSIS — R0602 Shortness of breath: Secondary | ICD-10-CM

## 2023-05-25 DIAGNOSIS — Z6826 Body mass index (BMI) 26.0-26.9, adult: Secondary | ICD-10-CM

## 2023-05-25 DIAGNOSIS — E88819 Insulin resistance, unspecified: Secondary | ICD-10-CM

## 2023-05-25 DIAGNOSIS — E7849 Other hyperlipidemia: Secondary | ICD-10-CM

## 2023-05-25 DIAGNOSIS — E559 Vitamin D deficiency, unspecified: Secondary | ICD-10-CM

## 2023-05-25 DIAGNOSIS — F509 Eating disorder, unspecified: Secondary | ICD-10-CM | POA: Diagnosis not present

## 2023-05-25 MED ORDER — VITAMIN D (ERGOCALCIFEROL) 1.25 MG (50000 UNIT) PO CAPS: 50000.0000 [IU] | ORAL_CAPSULE | ORAL | 0 refills | Status: DC

## 2023-05-25 NOTE — Progress Notes (Signed)
Theresa Johnson, D.O.  ABFM, ABOM Specializing in Clinical Bariatric Medicine  Office located at: [address] [place], [place]  [address]     Assessment and Plan:   Orders Placed This Encounter  Procedures   VITAMIN D 25 Hydroxy (Vit-D Deficiency, Fractures)   Vitamin B12   Magnesium   Basic metabolic panel    Medications Discontinued During This Encounter  Medication Reason   Vitamin D, Ergocalciferol, (DRISDOL) 1.25 MG (50000 UNIT) CAPS capsule Reorder     Meds ordered this encounter  Medications   Vitamin D, Ergocalciferol, (DRISDOL) 1.25 MG (50000 UNIT) CAPS capsule    Sig: Take 1 capsule (50,000 Units total) by mouth every 7 (seven) days.    Dispense:  12 capsule    Refill:  0    90 d supply;  ** OV for RF **   Do not send RF request     SOB (shortness of breath) on exertion Assessment & Plan: Counseling done with patient regarding repeat I.C. results. It shows a VO2 of 218 and an REE of 1498.  Her calculated basal metabolic rate is 8841 thus her measured basal metabolic rate is essentially the same.  Reviewed aspects of diet and lifestyle today with patient that will increase metabolism. Pt switched to Cat 1 meal plan with 2-4 extra ounces of lean protein. Continue to focus on lean proteins, adequate nutrition and advancing cardiovascular and resistance training as tolerated.  We recommended to start with 2 days of weight lifting per week.    Other hyperlipidemia Assessment & Plan: Last Lipid panel obtained on 12/31/22, which showed mildly elevated LDL of 105. Other components of cholesterol within normal limits. Pt not on statin therapy. Pt had CT Cardiac scoring on 04/15/2023. It did not show any calcium deposits or incidental abnormalities. Will recheck lipid panel in the future. Continue with our treatment plan of a heart-heathy, low cholesterol meal plan.   Vitamin D deficiency Assessment & Plan: Vitamin D of 52.0 on 12/31/22. Condition managed with  high-dose vitamin D 50,000 units once a week. Will recheck vitamin D levels today- Will refill ERGO today.     Insulin resistance Assessment & Plan: Fasting insulin of 2.7 on 12/31/22. Pt started consistently taking Metformin roughly 4 weeks ago. Depending on the day, pt may take either 0.5 tablet or a full tablet. Hunger and cravings are pretty well controlled. Maintain with Metformin; no dose change. Will recheck B12, Magnesium and BMP today.    BMI 26.0-26.9,adult-current bmi 26.64 Morbid obesity (HCC)-start bmi 36.64/date 10/03/20 Assessment & Plan: Since last office visit on 03/16/23 patient's muscle mass has decreased by 0.4 lb. Fat mass has increased by 0.2 lb. Total body water has not changed. Counseling done on how various foods will affect these numbers and how to maximize success  Total lbs lost to date: 62 lbs  Total weight loss percentage to date: 8.29%  Meal Plan: See note under SOB on exertion for meal plan modification.   Behavioral Intervention Additional resources provided today: Category 1 meal plan information  Evidence-based interventions for health behavior change were utilized today including the discussion of self monitoring techniques, problem-solving barriers and SMART goal setting techniques.   Regarding patient's less desirable eating habits and patterns, we employed the technique of small changes.  Pt will specifically work on: n/a  FOLLOW UP: Return 07/29/23. She was informed of the importance of frequent follow up visits to maximize her success with intensive lifestyle modifications for her  multiple health conditions.  Theresa Johnson is aware that we will review all of her lab results at our next visit.  She is aware that if anything is critical/ life threatening with the results, we will be contacting her via MyChart prior to the office visit to discuss management.    Subjective:   Chief complaint: Obesity Theresa Johnson is here to discuss her progress with  her obesity treatment plan. She is on the Category 2 Plan with extra [place] [employer]urt and states she is following her eating plan approximately 80% of the time. She states she is doing pilates/walking 50 minutes 7 days per week.  Interval History:  Theresa Johnson is here for a follow up office visit. Since last OV, Jaidalyn has been doing well. Reports being busy with traveling, family, and work. Overall, she has been doing well with her eating plan and has no c/o of hunger and cravings. Will obtain various labs today as part of routine screening.  Pharmacotherapy for weight loss: She is perscribed Metformin 500 mg daily   for medical weight loss. Denies side effects.    Review of Systems:  Pertinent positives were addressed with patient today.  Reviewed by clinician on day of visit: allergies, medications, problem list, medical history, surgical history, family history, social history, and previous encounter notes.  Weight Summary and Biometrics   Weight Lost Since Last Visit: 0lb  Weight Gained Since Last Visit: 0lb   Vitals Temp: 99.1 F (37.3 C) BP: 121/79 Pulse Rate: 62 SpO2: 99 %   Anthropometric Measurements Height: 5\' 6"  (1.676 m) Weight: 165 lb (74.8 kg) BMI (Calculated): 26.64 Weight at Last Visit: 165lb Weight Lost Since Last Visit: 0lb Weight Gained Since Last Visit: 0lb Starting Weight: 227lb Total Weight Loss (lbs): 62 lb (28.1 kg) Peak Weight: 227lb   Body Composition  Body Fat %: 32.3 % Fat Mass (lbs): 53.4 lbs Muscle Mass (lbs): 106.4 lbs Total Body Water (lbs): 75.6 lbs Visceral Fat Rating : 7   Other Clinical Data RMR: [id number] Fasting: yes Labs: yes Today's Visit #: 10/03/20   Objective:   PHYSICAL EXAM: Blood pressure 121/79, pulse 62, temperature 99.1 F (37.3 C), height 5\' 6"  (1.676 m), weight 165 lb (74.8 kg), SpO2 99%. Body mass index is 26.63 kg/m.  General: Well Developed, well nourished, and in no acute distress.  HEENT:  Normocephalic, atraumatic Skin: Warm and dry, cap RF less 2 sec, good turgor Chest:  Normal excursion, shape, no gross abn Respiratory: speaking in full sentences, no conversational dyspnea NeuroM-Sk: Ambulates w/o assistance, moves * 4 Psych: A and O *3, insight good, mood-full  DIAGNOSTIC DATA REVIEWED:  BMET    Component Value Date/Time   NA 140 12/31/2022 1023   K 4.0 12/31/2022 1023   CL 104 12/31/2022 1023   CO2 23 12/31/2022 1023   GLUCOSE 77 12/31/2022 1023   GLUCOSE 95 05/19/2021 0817   BUN 12 12/31/2022 1023   CREATININE 0.81 12/31/2022 1023   CALCIUM 8.8 12/31/2022 1023   GFRNONAA >60 05/19/2021 0817   GFRAA 94 10/03/2020 1213   Lab Results  Component Value Date   HGBA1C 5.2 06/03/2022   HGBA1C 5.4 10/03/2020   Lab Results  Component Value Date   INSULIN 2.7 12/31/2022   INSULIN 5.5 10/03/2020   Lab Results  Component Value Date   TSH 1.380 06/03/2022   CBC    Component Value Date/Time   WBC 5.1 06/03/2022 0941   WBC 8.4 05/19/2021 0802  RBC 4.10 06/03/2022 0941   RBC 3.97 05/19/2021 0802   HGB 13.4 06/03/2022 0941   HCT 41.7 06/03/2022 0941   PLT 195 06/03/2022 0941   MCV 102 (H) 06/03/2022 0941   MCH 32.7 06/03/2022 0941   MCH 33.8 05/19/2021 0802   MCHC 32.1 06/03/2022 0941   MCHC 33.4 05/19/2021 0802   RDW 12.0 06/03/2022 0941   Iron Studies    Component Value Date/Time   FERRITIN 234.7 07/03/2020 0953   Lipid Panel     Component Value Date/Time   CHOL 194 12/31/2022 1023   TRIG 59 12/31/2022 1023   HDL 78 12/31/2022 1023   CHOLHDL 2.5 11/24/2021 1024   CHOLHDL 3 07/03/2020 0953   VLDL 19.0 07/03/2020 0953   LDLCALC 105 (H) 12/31/2022 1023   Hepatic Function Panel     Component Value Date/Time   PROT 6.4 12/31/2022 1023   ALBUMIN 4.1 12/31/2022 1023   AST 22 12/31/2022 1023   ALT 17 12/31/2022 1023   ALKPHOS 94 12/31/2022 1023   BILITOT 0.9 12/31/2022 1023      Component Value Date/Time   TSH 1.380 06/03/2022 0941    Nutritional Lab Results  Component Value Date   VD25OH 52.0 12/31/2022   VD25OH 55.1 06/03/2022   VD25OH 49.1 11/24/2021    Attestations:   Patient was in the office today and time spent on visit including pre-visit chart review and post-visit care/coordination of care and electronic medical record documentation was 40 minutes. 50% of the time was in face to face counseling of this patient's medical condition(s) and providing education on treatment options to include the first-line treatment of diet and lifestyle modification.   I, Special Randolm Idol, acting as a Stage manager for [employer], DO., have compiled all relevant documentation for today's office visit on behalf of Thomasene Lot, DO, while in the presence of [employer], DO.  I have reviewed the above documentation for accuracy and completeness, and I agree with the above. Theresa Johnson, D.O.  The 21st Century Cures Act was signed into law in 2016 which includes the topic of electronic health records.  This provides immediate access to information in MyChart.  This includes consultation notes, operative notes, office notes, lab results and pathology reports.  If you have any questions about what you read please let us know at your next visit so we can discuss your concerns and take corrective action if need be.  We are right here with you.

## 2023-05-26 LAB — BASIC METABOLIC PANEL
BUN/Creatinine Ratio: 21 (ref 9–23)
BUN: 16 mg/dL (ref 6–24)
CO2: 21 mmol/L (ref 20–29)
Calcium: 8.9 mg/dL (ref 8.7–10.2)
Chloride: 106 mmol/L (ref 96–106)
Creatinine, Ser: 0.76 mg/dL (ref 0.57–1.00)
Glucose: 82 mg/dL (ref 70–99)
Potassium: 3.8 mmol/L (ref 3.5–5.2)
Sodium: 143 mmol/L (ref 134–144)
eGFR: 93 mL/min/{1.73_m2} (ref >59)

## 2023-05-26 LAB — VITAMIN B12: Vitamin B-12: 528 pg/mL (ref 232–1245)

## 2023-05-26 LAB — MAGNESIUM: Magnesium: 2.1 mg/dL (ref 1.6–2.3)

## 2023-05-26 LAB — VITAMIN D 25 HYDROXY (VIT D DEFICIENCY, FRACTURES): Vit D, 25-Hydroxy: 56.2 ng/mL (ref 30.0–100.0)

## 2023-06-14 ENCOUNTER — Ambulatory Visit: Payer: BC Managed Care – PPO | Admitting: Internal Medicine

## 2023-06-24 ENCOUNTER — Ambulatory Visit: Payer: BC Managed Care – PPO | Admitting: Internal Medicine

## 2023-06-24 ENCOUNTER — Encounter: Payer: Self-pay | Admitting: Internal Medicine

## 2023-06-24 VITALS — BP 128/82 | HR 65 | Temp 98.7°F | Ht 66.0 in | Wt 170.0 lb

## 2023-06-24 DIAGNOSIS — Z0000 Encounter for general adult medical examination without abnormal findings: Secondary | ICD-10-CM | POA: Diagnosis not present

## 2023-06-24 DIAGNOSIS — E559 Vitamin D deficiency, unspecified: Secondary | ICD-10-CM

## 2023-06-24 DIAGNOSIS — F4320 Adjustment disorder, unspecified: Secondary | ICD-10-CM

## 2023-06-24 NOTE — Assessment & Plan Note (Signed)
Flu shot complete for season. Shingrix complete. Tetanus up to date. Colonoscopy up to date. Mammogram up to date, pap smear up to date. Counseled about sun safety and mole surveillance. Counseled about the dangers of distracted driving. Given 10 year screening recommendations.

## 2023-06-24 NOTE — Progress Notes (Signed)
   Subjective:   Patient ID: Theresa Johnson, female    DOB: Sep 06, 1970, 52 y.o.   MRN: [id number]  HPI The patient is here for physical.  PMH, [facility], social history reviewed and updated  Review of Systems  Constitutional: Negative.   HENT: Negative.    Eyes: Negative.   Respiratory:  Negative for cough, chest tightness and shortness of breath.   Cardiovascular:  Negative for chest pain, palpitations and leg swelling.  Gastrointestinal:  Negative for abdominal distention, abdominal pain, constipation, diarrhea, nausea and vomiting.  Musculoskeletal: Negative.   Skin: Negative.   Neurological: Negative.   Psychiatric/Behavioral: Negative.      Objective:  Physical Exam Constitutional:      Appearance: She is well-developed.  HENT:     Head: Normocephalic and atraumatic.  Cardiovascular:     Rate and Rhythm: Normal rate and regular rhythm.  Pulmonary:     Effort: Pulmonary effort is normal. No respiratory distress.     Breath sounds: Normal breath sounds. No wheezing or rales.  Abdominal:     General: Bowel sounds are normal. There is no distension.     Palpations: Abdomen is soft.     Tenderness: There is no abdominal tenderness. There is no rebound.  Musculoskeletal:     Cervical back: Normal range of motion.  Skin:    General: Skin is warm and dry.  Neurological:     Mental Status: She is alert and oriented to person, place, and time.     Coordination: Coordination normal.     Vitals:   06/24/23 1041  BP: 128/82  Pulse: 65  Temp: 98.7 F (37.1 C)  TempSrc: Oral  SpO2: 99%  Weight: 170 lb (77.1 kg)  Height: 5\' 6"  (1.676 m)    Assessment & Plan:

## 2023-06-24 NOTE — Assessment & Plan Note (Signed)
Recent vitamin D levels are normal and taking vitamin D supplement.

## 2023-06-24 NOTE — Assessment & Plan Note (Signed)
Caregiver stress exists and she is managing well.

## 2023-07-29 ENCOUNTER — Ambulatory Visit (INDEPENDENT_AMBULATORY_CARE_PROVIDER_SITE_OTHER): Payer: BC Managed Care – PPO | Admitting: Family Medicine

## 2023-08-16 ENCOUNTER — Encounter (INDEPENDENT_AMBULATORY_CARE_PROVIDER_SITE_OTHER): Payer: Self-pay | Admitting: Family Medicine

## 2023-08-16 ENCOUNTER — Ambulatory Visit (INDEPENDENT_AMBULATORY_CARE_PROVIDER_SITE_OTHER): Payer: BC Managed Care – PPO | Admitting: Family Medicine

## 2023-08-16 VITALS — BP 125/77 | HR 63 | Temp 98.7°F | Ht 66.0 in | Wt 174.0 lb

## 2023-08-16 DIAGNOSIS — E88819 Insulin resistance, unspecified: Secondary | ICD-10-CM

## 2023-08-16 DIAGNOSIS — E669 Obesity, unspecified: Secondary | ICD-10-CM | POA: Diagnosis not present

## 2023-08-16 DIAGNOSIS — Z6828 Body mass index (BMI) 28.0-28.9, adult: Secondary | ICD-10-CM

## 2023-08-16 DIAGNOSIS — E559 Vitamin D deficiency, unspecified: Secondary | ICD-10-CM

## 2023-08-16 MED ORDER — METFORMIN HCL 500 MG PO TABS
ORAL_TABLET | ORAL | 0 refills | Status: DC
Start: 2023-08-16 — End: 2023-10-12

## 2023-08-16 NOTE — Progress Notes (Signed)
 Theresa Johnson, D.O.  ABFM, ABOM Specializing in Clinical Bariatric Medicine  Office located at: [address] [place], [place]  [address]   Assessment and Plan:   FOR THE DISEASE OF OBESITY:  Since last office visit on 05/25/23 patient's muscle mass has increased by 3.6lb. Fat mass has increased by 4.8lb. Total body water has increased by 3.8lb.  Counseling done on how various foods will affect these numbers and how to maximize success  Total lbs lost to date: 53 lbs Total weight loss percentage to date: -23.35 %   Recommended Dietary Goals Theresa Johnson is currently in the action stage of change. As such, her goal is to continue weight management plan.  She has agreed to: continue current plan   Behavioral Intervention We discussed the following today: increasing water intake , practice mindfulness eating and understand the difference between hunger signals and cravings, and continue to work on implementation of reduced calorie nutritional plan  Additional resources provided today: None  Evidence-based interventions for health behavior change were utilized today including the discussion of self monitoring techniques, problem-solving barriers and SMART goal setting techniques.   Regarding patient's less desirable eating habits and patterns, we employed the technique of small changes.   Pt will specifically work on: following her meal plan for next visit.    Recommended Physical Activity Goals Theresa Johnson has been advised to work up to 150 minutes of moderate intensity aerobic activity a week and strengthening exercises 2-3 times per week for cardiovascular health, weight loss maintenance and preservation of muscle mass.   She has agreed to :  Continue current level of physical activity    Pharmacotherapy We discussed various medication options to help Theresa Johnson with her weight loss efforts and we both agreed to : continue with nutritional and behavioral strategies and  continue current anti-obesity medication regimen   FOR ASSOCIATED CONDITIONS ADDRESSED TODAY: Insulin  resistance Assessment & Plan: Lab Results  Component Value Date   HGBA1C 5.2 06/03/2022   HGBA1C 5.3 11/24/2021   HGBA1C 5.5 07/31/2021   INSULIN  2.7 12/31/2022   INSULIN  4.8 06/03/2022   INSULIN  3.1 11/24/2021    Pt has been taking one 500 mg tablet of Metformin  once daily instead of splitting her pill and taking half in the morning and evenings. She reports increased hunger and cravings in the evenings when taking 1 tablet once daily.   Advised to increase water intake. Reviewed mindful eating habits. Discussed options of increasing her dose of Metformin  and mutually agreed to increase to 500 mg BID. We will continue to monitor her condition as it relates to her weight loss journey.    Vitamin D  deficiency Assessment & Plan: Lab Results  Component Value Date   VD25OH 56.2 05/25/2023   VD25OH 52.0 12/31/2022   VD25OH 55.1 06/03/2022   Pt is taking ERGO 50K units once weekly. Tolerating well. No side effects reported. No acute concerns reported today. Vitamin levels were at goal when last check in 05/2023. Continue current regimen.    Follow up:   Return in about 29 days (around 09/14/2023). She was informed of the importance of frequent follow up visits to maximize her success with intensive lifestyle modifications for her multiple health conditions.  Subjective:   Chief complaint: Obesity Theresa Johnson is here to discuss her progress with her obesity treatment plan. She is on the Category 1 Plan with 2-4 extra ounces of lean protein and states she is following her eating plan approximately 50% of the time.  She states she is doing pilates and strength training for 60 minutes 4-5 days per week.  Interval History:  Theresa Johnson is here for a follow up office visit. Since last OV, she is up 9 lbs. She reports some eating off plan during the holidays and her sugar and salt  intake increased.   Followed up with gyn for hot flashes. Consulted with a pharmacists, referred by gyn, who recommended compound hormone cream treatments. Since then her hot flashes, sleep quality, and energy levels have all improved.   Barriers identified: Has not been walking as she used to due to the cold weather and the sun going down earlier in the day.   Pharmacotherapy for weight loss: She is currently taking Metformin  (off label use for incretin effect and / or insulin  resistance and / or diabetes prevention) with adequate clinical response  and without side effects..   Review of Systems:  Pertinent positives were addressed with patient today.  Reviewed by clinician on day of visit: allergies, medications, problem list, medical history, surgical history, family history, social history, and previous encounter notes.  Weight Summary and Biometrics   Weight Lost Since Last Visit: 0 lb  Weight Gained Since Last Visit: 9 lb   Vitals Temp: 98.7 F (37.1 C) BP: 125/77 Pulse Rate: 63 SpO2: 99 %   Anthropometric Measurements Height: 5' 6 (1.676 m) Weight: 174 lb (78.9 kg) BMI (Calculated): 28.1 Weight at Last Visit: 165 lb Weight Lost Since Last Visit: 0 lb Weight Gained Since Last Visit: 9 lb Starting Weight: 227 lb Total Weight Loss (lbs): 53 lb (24 kg) Peak Weight: 227 lb   Body Composition  Body Fat %: 33.5 % Fat Mass (lbs): 58.2 lbs Muscle Mass (lbs): 110 lbs Total Body Water (lbs): 79.4 lbs Visceral Fat Rating : 8   Other Clinical Data Fasting: No Labs: No Today's Visit #: 28 Starting Date: 10/03/20    Objective:   PHYSICAL EXAM: Blood pressure 125/77, pulse 63, temperature 98.7 F (37.1 C), height 5' 6 (1.676 m), weight 174 lb (78.9 kg), SpO2 99%. Body mass index is 28.08 kg/m.  General: she is overweight, cooperative and in no acute distress. PSYCH: Has normal mood, affect and thought process.   HEENT: EOMI, sclerae are anicteric. Lungs:  Normal breathing effort, no conversational dyspnea. Extremities: Moves * 4 Neurologic: A and O * 3, good insight  DIAGNOSTIC DATA REVIEWED: BMET    Component Value Date/Time   NA 143 05/25/2023 1100   K 3.8 05/25/2023 1100   CL 106 05/25/2023 1100   CO2 21 05/25/2023 1100   GLUCOSE 82 05/25/2023 1100   GLUCOSE 95 05/19/2021 0817   BUN 16 05/25/2023 1100   CREATININE 0.76 05/25/2023 1100   CALCIUM 8.9 05/25/2023 1100   GFRNONAA >60 05/19/2021 0817   GFRAA 94 10/03/2020 1213   Lab Results  Component Value Date   HGBA1C 5.2 06/03/2022   HGBA1C 5.4 10/03/2020   Lab Results  Component Value Date   INSULIN  2.7 12/31/2022   INSULIN  5.5 10/03/2020   Lab Results  Component Value Date   TSH 1.380 06/03/2022   CBC    Component Value Date/Time   WBC 5.1 06/03/2022 0941   WBC 8.4 05/19/2021 0802   RBC 4.10 06/03/2022 0941   RBC 3.97 05/19/2021 0802   HGB 13.4 06/03/2022 0941   HCT 41.7 06/03/2022 0941   PLT 195 06/03/2022 0941   MCV 102 (H) 06/03/2022 0941   MCH 32.7 06/03/2022 0941  MCH 33.8 05/19/2021 0802   MCHC 32.1 06/03/2022 0941   MCHC 33.4 05/19/2021 0802   RDW 12.0 06/03/2022 0941   Iron Studies    Component Value Date/Time   FERRITIN 234.7 07/03/2020 0953   Lipid Panel     Component Value Date/Time   CHOL 194 12/31/2022 1023   TRIG 59 12/31/2022 1023   HDL 78 12/31/2022 1023   CHOLHDL 2.5 11/24/2021 1024   CHOLHDL 3 07/03/2020 0953   VLDL 19.0 07/03/2020 0953   LDLCALC 105 (H) 12/31/2022 1023   Hepatic Function Panel     Component Value Date/Time   PROT 6.4 12/31/2022 1023   ALBUMIN 4.1 12/31/2022 1023   AST 22 12/31/2022 1023   ALT 17 12/31/2022 1023   ALKPHOS 94 12/31/2022 1023   BILITOT 0.9 12/31/2022 1023      Component Value Date/Time   TSH 1.380 06/03/2022 0941   Nutritional Lab Results  Component Value Date   VD25OH 56.2 05/25/2023   VD25OH 52.0 12/31/2022   VD25OH 55.1 06/03/2022    Attestations:   I, Theresa Johnson,  acting as a stage manager for Theresa Jenkins, DO., have compiled all relevant documentation for today's office visit on behalf of Theresa Jenkins, DO, while in the presence of [employer], DO.  Reviewed by clinician on day of visit: allergies, medications, problem list, medical history, surgical history, family history, social history, and previous encounter notes pertinent to patient's obesity diagnosis.  I have reviewed the above documentation for accuracy and completeness, and I agree with the above. Theresa Theresa Johnson, D.O.  The 21st Century Cures Act was signed into law in 2016 which includes the topic of electronic health records.  This provides immediate access to information in MyChart.  This includes consultation notes, operative notes, office notes, lab results and pathology reports.  If you have any questions about what you read please let us  know at your next visit so we can discuss your concerns and take corrective action if need be.  We are right here with you.

## 2023-08-17 DIAGNOSIS — E669 Obesity, unspecified: Secondary | ICD-10-CM | POA: Insufficient documentation

## 2023-09-06 ENCOUNTER — Telehealth: Payer: Self-pay | Admitting: Physician Assistant

## 2023-09-06 DIAGNOSIS — B9689 Other specified bacterial agents as the cause of diseases classified elsewhere: Secondary | ICD-10-CM

## 2023-09-06 DIAGNOSIS — J019 Acute sinusitis, unspecified: Secondary | ICD-10-CM | POA: Diagnosis not present

## 2023-09-08 MED ORDER — BENZONATATE 100 MG PO CAPS: 100.0000 mg | ORAL_CAPSULE | Freq: Three times a day (TID) | ORAL | 0 refills | Status: DC | PRN

## 2023-09-08 MED ORDER — AMOXICILLIN-POT CLAVULANATE 875-125 MG PO TABS
1.0000 | ORAL_TABLET | Freq: Two times a day (BID) | ORAL | 0 refills | Status: DC
Start: 2023-09-08 — End: 2023-10-12

## 2023-09-08 NOTE — Progress Notes (Signed)

## 2023-09-12 ENCOUNTER — Other Ambulatory Visit (INDEPENDENT_AMBULATORY_CARE_PROVIDER_SITE_OTHER): Payer: Self-pay | Admitting: Family Medicine

## 2023-09-12 DIAGNOSIS — E88819 Insulin resistance, unspecified: Secondary | ICD-10-CM

## 2023-09-14 ENCOUNTER — Ambulatory Visit (INDEPENDENT_AMBULATORY_CARE_PROVIDER_SITE_OTHER): Payer: Self-pay | Admitting: Family Medicine

## 2023-10-12 ENCOUNTER — Encounter (INDEPENDENT_AMBULATORY_CARE_PROVIDER_SITE_OTHER): Payer: Self-pay | Admitting: Family Medicine

## 2023-10-12 ENCOUNTER — Ambulatory Visit (INDEPENDENT_AMBULATORY_CARE_PROVIDER_SITE_OTHER): Payer: Self-pay | Admitting: Family Medicine

## 2023-10-12 VITALS — BP 131/69 | HR 57 | Temp 98.2°F | Ht 66.0 in | Wt 169.0 lb

## 2023-10-12 DIAGNOSIS — E559 Vitamin D deficiency, unspecified: Secondary | ICD-10-CM

## 2023-10-12 DIAGNOSIS — E88819 Insulin resistance, unspecified: Secondary | ICD-10-CM

## 2023-10-12 DIAGNOSIS — E7849 Other hyperlipidemia: Secondary | ICD-10-CM

## 2023-10-12 DIAGNOSIS — Z6826 Body mass index (BMI) 26.0-26.9, adult: Secondary | ICD-10-CM

## 2023-10-12 DIAGNOSIS — E669 Obesity, unspecified: Secondary | ICD-10-CM | POA: Diagnosis not present

## 2023-10-12 DIAGNOSIS — Z6827 Body mass index (BMI) 27.0-27.9, adult: Secondary | ICD-10-CM

## 2023-10-12 MED ORDER — METFORMIN HCL 500 MG PO TABS: ORAL_TABLET | ORAL | 0 refills | Status: DC

## 2023-10-12 MED ORDER — VITAMIN D (ERGOCALCIFEROL) 1.25 MG (50000 UNIT) PO CAPS: 50000.0000 [IU] | ORAL_CAPSULE | ORAL | 0 refills | Status: DC

## 2023-10-12 NOTE — Progress Notes (Signed)
 Theresa Johnson, D.O.  ABFM, ABOM Specializing in Clinical Bariatric Medicine  Office located at: [address] [place], [place]  [address]   Assessment and Plan:   Pt will come in for labs 3-4 days prior to next OV.   FOR THE DISEASE OF OBESITY:  BMI 26.0-26.9,adult-current bmi 27.29 Obesity, Beginning BMI 36.64 Assessment & Plan: Since last office visit on 08/16/2023 patient's  Muscle mass has decreased by 2 lb. Fat mass has decreased by 2.2 lb. Total body water has decreased by 3.6 lb.  Counseling done on how various foods will affect these numbers and how to maximize success  Total lbs lost to date: 58 lbs  Total weight loss percentage to date: 25.55%   Recommended Dietary Goals Theresa Johnson is currently in the action stage of change. As such, her goal is to continue weight management plan.  She has agreed to: continue current plan   Behavioral Intervention We discussed the following today: healthier pasta alternatives, increasing lean protein intake to established goals and work on managing stress, creating time for self-care and relaxation  Additional resources provided today: None  Evidence-based interventions for health behavior change were utilized today including the discussion of self monitoring techniques, problem-solving barriers and SMART goal setting techniques.   Regarding patient's less desirable eating habits and patterns, we employed the technique of small changes.   Pt will specifically work on: 4-7-8 breathing twice daily for management of daily stressors   Recommended Physical Activity Goals Theresa Johnson has been advised to work up to 150 minutes of moderate intensity aerobic activity a week and strengthening exercises 2-3 times per week for cardiovascular health, weight loss maintenance and preservation of muscle mass.   She has agreed to : Continue current level of physical activity    Pharmacotherapy We both agreed to : continue with nutritional  and behavioral strategies and maintain with medication regimen   FOR ASSOCIATED CONDITIONS ADDRESSED TODAY:  Insulin resistance Assessment & Plan: Relevant medications: Metformin 500 mg BID. She denies N/V/D. She has been getting in the recommended amounts of lean protein - she typically has 8 ounces at dinner. She sometimes still feels hungry after dinner.  Consider increasing Metformin in the future if hunger persists. Continue balanced diet. Will order labs.   Relevant Orders:  -     metFORMIN HCl; 1 tab BID  Dispense: 180 tablet; Refill: 0 -     Hemoglobin A1c -     Insulin, random -     CBC with Differential/Platelet   Other hyperlipidemia Assessment & Plan: H/o elevated LDL. No medicines. Pt advised to reduce saturated and trans fats in diet. We will order a lipid profile and provide further guidance at the next office visit.   Relevant Orders:  -     Lipid panel -     Basic metabolic panel   Vitamin D deficiency Assessment & Plan: Pt is on prescription high dose vitamin D once weekly. No N/V or muscle weakness with Ergocalciferol. Continue vitamin D supplementation - reorder labs.   Relevant Orders:  -     Vitamin D (Ergocalciferol); Take 1 capsule (50,000 Units total) by mouth every 7 (seven) days.  Dispense: 12 capsule; Refill: 0 -     VITAMIN D 25 Hydroxy (Vit-D Deficiency, Fractures)   Follow up:   Return 12/13/2023. She was informed of the importance of frequent follow up visits to maximize her success with intensive lifestyle modifications for her multiple health conditions.  Subjective:  Chief complaint: Obesity Theresa Johnson is here to discuss her progress with her obesity treatment plan. She is on the Category 1 Plan with 2-4 extra ounces of lean protein and states she is following her eating plan approximately 80% of the time. She states she is doing piliates 60 minutes 5 days a week and walking 2-3 miles daily.    Interval History:  Theresa Johnson is  here for a follow up office visit. Since last OV on 08/16/2023, Theresa Johnson is down 5 lbs. Recently went to [facility] around [employer] and ate some "off-plan foods" there. She started being disciplined again with her meal plan 2 weeks ago or so. She has been getting in the recommended amounts of lean protein - she typically has 8 ounces at dinner. She sometimes still feels hungry after dinner. Will be going back to [place] during her birthday week for 6 days.   Pharmacotherapy for weight loss: She is currently taking  Metformin 500 mg BID .   Review of Systems:  Pertinent positives were addressed with patient today.  Reviewed by clinician on day of visit: allergies, medications, problem list, medical history, surgical history, family history, social history, and previous encounter notes.  Weight Summary and Biometrics   Weight Lost Since Last Visit: 5 lb  Weight Gained Since Last Visit: 0  Vitals Temp: 98.2 F (36.8 C) BP: 131/69 Pulse Rate: (!) 57 SpO2: 99 %   Anthropometric Measurements Height: 5\' 6"  (1.676 m) Weight: 169 lb (76.7 kg) BMI (Calculated): 27.29 Weight at Last Visit: 174 lb Weight Lost Since Last Visit: 5 lb Weight Gained Since Last Visit: 0 Starting Weight: 227 lb Total Weight Loss (lbs): 58 lb (26.3 kg) Peak Weight: 227 lb   Body Composition  Body Fat %: 33 % Fat Mass (lbs): 56 lbs Muscle Mass (lbs): 108 lbs Total Body Water (lbs): 75.8 lbs Visceral Fat Rating : 7   Other Clinical Data Fasting: Yes Today's Visit #: 29 Starting Date: 10/03/20   Objective:   PHYSICAL EXAM: Blood pressure 131/69, pulse (!) 57, temperature 98.2 F (36.8 C), height 5\' 6"  (1.676 m), weight 169 lb (76.7 kg), SpO2 99%. Body mass index is 27.28 kg/m.  General: she is overweight, cooperative and in no acute distress. PSYCH: Has normal mood, affect and thought process.   HEENT: EOMI, sclerae are anicteric. Lungs: Normal breathing effort, no conversational  dyspnea. Extremities: Moves * 4 Neurologic: A and O * 3, good insight  DIAGNOSTIC DATA REVIEWED: BMET    Component Value Date/Time   NA 143 05/25/2023 1100   K 3.8 05/25/2023 1100   CL 106 05/25/2023 1100   CO2 21 05/25/2023 1100   GLUCOSE 82 05/25/2023 1100   GLUCOSE 95 05/19/2021 0817   BUN 16 05/25/2023 1100   CREATININE 0.76 05/25/2023 1100   CALCIUM 8.9 05/25/2023 1100   GFRNONAA >60 05/19/2021 0817   GFRAA 94 10/03/2020 1213   Lab Results  Component Value Date   HGBA1C 5.2 06/03/2022   HGBA1C 5.4 10/03/2020   Lab Results  Component Value Date   INSULIN 2.7 12/31/2022   INSULIN 5.5 10/03/2020   Lab Results  Component Value Date   TSH 1.380 06/03/2022   CBC    Component Value Date/Time   WBC 5.1 06/03/2022 0941   WBC 8.4 05/19/2021 0802   RBC 4.10 06/03/2022 0941   RBC 3.97 05/19/2021 0802   HGB 13.4 06/03/2022 0941   HCT 41.7 06/03/2022 0941   PLT 195 06/03/2022 0941  MCV 102 (H) 06/03/2022 0941   MCH 32.7 06/03/2022 0941   MCH 33.8 05/19/2021 0802   MCHC 32.1 06/03/2022 0941   MCHC 33.4 05/19/2021 0802   RDW 12.0 06/03/2022 0941   Iron Studies    Component Value Date/Time   FERRITIN 234.7 07/03/2020 0953   Lipid Panel     Component Value Date/Time   CHOL 194 12/31/2022 1023   TRIG 59 12/31/2022 1023   HDL 78 12/31/2022 1023   CHOLHDL 2.5 11/24/2021 1024   CHOLHDL 3 07/03/2020 0953   VLDL 19.0 07/03/2020 0953   LDLCALC 105 (H) 12/31/2022 1023   Hepatic Function Panel     Component Value Date/Time   PROT 6.4 12/31/2022 1023   ALBUMIN 4.1 12/31/2022 1023   AST 22 12/31/2022 1023   ALT 17 12/31/2022 1023   ALKPHOS 94 12/31/2022 1023   BILITOT 0.9 12/31/2022 1023      Component Value Date/Time   TSH 1.380 06/03/2022 0941   Nutritional Lab Results  Component Value Date   VD25OH 56.2 05/25/2023   VD25OH 52.0 12/31/2022   VD25OH 55.1 06/03/2022    Attestations:   I, Special Puri, acting as a Stage manager for [employer],  DO., have compiled all relevant documentation for today's office visit on behalf of Theresa Lot, DO, while in the presence of [employer], DO.  Reviewed by clinician on day of visit: allergies, medications, problem list, medical history, surgical history, family history, social history, and previous encounter notes pertinent to patient's obesity diagnosis. I have spent 40 minutes in the care of the patient today including: preparing to see patient (e.g. review and interpretation of tests, old notes ), obtaining and/or reviewing separately obtained history, performing a medically appropriate examination or evaluation, counseling and educating the patient, ordering medications, test or procedures, documenting clinical information in the electronic or other health care record, and independently interpreting results and communicating results to the patient, family, or caregiver   I have reviewed the above documentation for accuracy and completeness, and I agree with the above. Theresa Johnson, D.O.  The 21st Century Cures Act was signed into law in 2016 which includes the topic of electronic health records.  This provides immediate access to information in MyChart.  This includes consultation notes, operative notes, office notes, lab results and pathology reports.  If you have any questions about what you read please let us know at your next visit so we can discuss your concerns and take corrective action if need be.  We are right here with you.

## 2023-12-09 ENCOUNTER — Other Ambulatory Visit: Payer: Self-pay | Admitting: Obstetrics and Gynecology

## 2023-12-09 DIAGNOSIS — E7849 Other hyperlipidemia: Secondary | ICD-10-CM | POA: Diagnosis not present

## 2023-12-09 DIAGNOSIS — E559 Vitamin D deficiency, unspecified: Secondary | ICD-10-CM | POA: Diagnosis not present

## 2023-12-09 DIAGNOSIS — Z1231 Encounter for screening mammogram for malignant neoplasm of breast: Secondary | ICD-10-CM

## 2023-12-09 DIAGNOSIS — E88819 Insulin resistance, unspecified: Secondary | ICD-10-CM | POA: Diagnosis not present

## 2023-12-10 LAB — CBC WITH DIFFERENTIAL/PLATELET
Basophils Absolute: 0 10*3/uL (ref 0.0–0.2)
Basos: 1 %
EOS (ABSOLUTE): 0.1 10*3/uL (ref 0.0–0.4)
Eos: 2 %
Hematocrit: 39.9 % (ref 34.0–46.6)
Hemoglobin: 13.2 g/dL (ref 11.1–15.9)
Immature Grans (Abs): 0 10*3/uL (ref 0.0–0.1)
Immature Granulocytes: 0 %
Lymphocytes Absolute: 1.5 10*3/uL (ref 0.7–3.1)
Lymphs: 33 %
MCH: 32.4 pg (ref 26.6–33.0)
MCHC: 33.1 g/dL (ref 31.5–35.7)
MCV: 98 fL — ABNORMAL HIGH (ref 79–97)
Monocytes Absolute: 0.5 10*3/uL (ref 0.1–0.9)
Monocytes: 12 %
Neutrophils Absolute: 2.4 10*3/uL (ref 1.4–7.0)
Neutrophils: 52 %
Platelets: 214 10*3/uL (ref 150–450)
RBC: 4.07 x10E6/uL (ref 3.77–5.28)
RDW: 12.3 % (ref 11.7–15.4)
WBC: 4.5 10*3/uL (ref 3.4–10.8)

## 2023-12-10 LAB — HEMOGLOBIN A1C
Est. average glucose Bld gHb Est-mCnc: 108 mg/dL
Hgb A1c MFr Bld: 5.4 % (ref 4.8–5.6)

## 2023-12-10 LAB — BASIC METABOLIC PANEL WITH GFR
BUN/Creatinine Ratio: 19 (ref 9–23)
BUN: 17 mg/dL (ref 6–24)
CO2: 23 mmol/L (ref 20–29)
Calcium: 9.6 mg/dL (ref 8.7–10.2)
Chloride: 103 mmol/L (ref 96–106)
Creatinine, Ser: 0.89 mg/dL (ref 0.57–1.00)
Glucose: 88 mg/dL (ref 70–99)
Potassium: 3.6 mmol/L (ref 3.5–5.2)
Sodium: 141 mmol/L (ref 134–144)
eGFR: 77 mL/min/{1.73_m2} (ref >59)

## 2023-12-10 LAB — VITAMIN D 25 HYDROXY (VIT D DEFICIENCY, FRACTURES): Vit D, 25-Hydroxy: 68.8 ng/mL (ref 30.0–100.0)

## 2023-12-10 LAB — LIPID PANEL
Chol/HDL Ratio: 2.8 ratio (ref 0.0–4.4)
Cholesterol, Total: 231 mg/dL — ABNORMAL HIGH (ref 100–199)
HDL: 84 mg/dL (ref >39)
LDL Chol Calc (NIH): 130 mg/dL — ABNORMAL HIGH (ref 0–99)
Triglycerides: 99 mg/dL (ref 0–149)
VLDL Cholesterol Cal: 17 mg/dL (ref 5–40)

## 2023-12-10 LAB — INSULIN, RANDOM: INSULIN: 6.7 u[IU]/mL (ref 2.6–24.9)

## 2023-12-13 ENCOUNTER — Ambulatory Visit (INDEPENDENT_AMBULATORY_CARE_PROVIDER_SITE_OTHER): Admitting: Family Medicine

## 2023-12-13 ENCOUNTER — Encounter (INDEPENDENT_AMBULATORY_CARE_PROVIDER_SITE_OTHER): Payer: Self-pay | Admitting: Family Medicine

## 2023-12-13 VITALS — BP 121/72 | HR 73 | Temp 98.0°F | Ht 66.0 in | Wt 167.0 lb

## 2023-12-13 DIAGNOSIS — E669 Obesity, unspecified: Secondary | ICD-10-CM | POA: Diagnosis not present

## 2023-12-13 DIAGNOSIS — E7849 Other hyperlipidemia: Secondary | ICD-10-CM

## 2023-12-13 DIAGNOSIS — Z6826 Body mass index (BMI) 26.0-26.9, adult: Secondary | ICD-10-CM

## 2023-12-13 DIAGNOSIS — E559 Vitamin D deficiency, unspecified: Secondary | ICD-10-CM

## 2023-12-13 DIAGNOSIS — E88819 Insulin resistance, unspecified: Secondary | ICD-10-CM | POA: Diagnosis not present

## 2023-12-13 MED ORDER — METFORMIN HCL 500 MG PO TABS: ORAL_TABLET | ORAL | Status: DC

## 2023-12-13 MED ORDER — VITAMIN D (ERGOCALCIFEROL) 1.25 MG (50000 UNIT) PO CAPS: ORAL_CAPSULE | ORAL | 0 refills | Status: DC

## 2023-12-13 NOTE — Patient Instructions (Signed)
 The 10-year ASCVD risk score (Arnett DK, et al., 2019) is: 2%   Values used to calculate the score:     Age: 53 years     Sex: Female     Is Non-Hispanic African American: Yes     Diabetic: No     Tobacco smoker: No     Systolic Blood Pressure: 121 mmHg     Is BP treated: No     HDL Cholesterol: 84 mg/dL     Total Cholesterol: 231 mg/dL

## 2023-12-13 NOTE — Progress Notes (Signed)
 Theresa Johnson, D.O.  ABFM, ABOM Specializing in Clinical Bariatric Medicine  Office located at: [address] [place], [place]  [address]   Assessment and Plan:  No orders of the defined types were placed in this encounter.  Medications Discontinued During This Encounter  Medication Reason   Vitamin D , Ergocalciferol , (DRISDOL ) 1.25 MG (50000 UNIT) CAPS capsule Reorder   metFORMIN  (GLUCOPHAGE ) 500 MG tablet Reorder     Meds ordered this encounter  Medications   Vitamin D , Ergocalciferol , (DRISDOL ) 1.25 MG (50000 UNIT) CAPS capsule    Sig: 1 po q 10 days    Dispense:  12 capsule    Refill:  0    90 d supply;  ** OV for RF **   Do not send RF request   metFORMIN  (GLUCOPHAGE ) 500 MG tablet    Sig: 1 tab po with food    90 d supply;  ** OV for RF **   Do not send RF request      FOR THE DISEASE OF OBESITY:  BMI 26.0-26.9,adult-current bmi 26.97 Obesity, Beginning BMI 36.64 Assessment & Plan: Since last office visit on 10/12/2023, patient's muscle mass has decreased by 1.2 lbs. Fat mass has decreased by 1.6 lbs. Total body water has increased by 2 lbs.  Counseling done on how various foods will affect these numbers and how to maximize success  Total lbs lost to date: 60 lbs Total weight loss percentage to date: 26.43%    Recommended Dietary Goals Theresa Johnson is currently in the action stage of change. As such, her goal is to continue weight management plan.  She has agreed to: {EMWTLOSSPLAN:29297::"continue current plan"}   Behavioral Intervention We discussed the following today: {dowtlossstrategies:31654}  Additional resources provided today: {DOhandouts:31655::"None"}  Evidence-based interventions for health behavior change were utilized today including the discussion of self monitoring techniques, problem-solving barriers and SMART goal setting techniques.   Regarding patient's less desirable eating habits and patterns, we employed the technique of small  changes.   Pt will specifically work on: *** for next visit.    Recommended Physical Activity Goals Theresa Johnson has been advised to work up to 300-450 minutes of moderate intensity aerobic activity a week and strengthening exercises 2-3 times per week for cardiovascular health, weight loss maintenance and preservation of muscle mass.   She has agreed to :  {EMEXERCISE:28847::"Think about enjoyable ways to increase daily physical activity and overcoming barriers to exercise","Increase physical activity in their day and reduce sedentary time (increase NEAT)."}   Pharmacotherapy We both agreed to : {EMagreedrx:29170}   ASSOCIATED CONDITIONS ADDRESSED TODAY:  Insulin  resistance Assessment & Plan: Lab Results  Component Value Date   HGBA1C 5.4 12/09/2023   HGBA1C 5.2 06/03/2022   HGBA1C 5.3 11/24/2021   INSULIN  6.7 12/09/2023   INSULIN  2.7 12/31/2022   INSULIN  4.8 06/03/2022   Lab Results  Component Value Date   CREATININE 0.89 12/09/2023   BUN 17 12/09/2023   NA 141 12/09/2023   K 3.6 12/09/2023   CL 103 12/09/2023   CO2 23 12/09/2023   Lab Results  Component Value Date   WBC 4.5 12/09/2023   HGB 13.2 12/09/2023   HCT 39.9 12/09/2023   MCV 98 (H) 12/09/2023   PLT 214 12/09/2023      Metformin  500 mg once daily Continue current dose Reminded about benefits for insulin  resistance and A1c  A1c increased but still well controlled at 5.4  Insulin  slightly above goal of <5 Levels increased from 2.7  to 6.7 Reminded about role of carbs of blood sugar and insulin  levels  Kidney levels WNL Reminded to get adequate amounts of water Body weight in ounces and one extra bottle for every 30 minutes of exercise  CBC WNL   Other hyperlipidemia Assessment & Plan: Lab Results  Component Value Date   CHOL 231 (H) 12/09/2023   HDL 84 12/09/2023   LDLCALC 130 (H) 12/09/2023   TRIG 99 12/09/2023   CHOLHDL 2.8 12/09/2023  The 10-year ASCVD risk score (Arnett DK, et al.,  2019) is: 2%   Values used to calculate the score:     Age: 53 years     Sex: Female     Is Non-Hispanic African American: Yes     Diabetic: No     Tobacco smoker: No     Systolic Blood Pressure: 121 mmHg     Is BP treated: No     HDL Cholesterol: 84 mg/dL     Total Cholesterol: 231 mg/dL   LDL has worsened and is still elevated, but HDL has improved from 78 to 84 and other lipid levels are WNL Ascvd score is low, no need for medications Decrease saturated/trans fats in diet Strong fhx of HLD     Vitamin D  deficiency Assessment & Plan: Lab Results  Component Value Date   VD25OH 68.8 12/09/2023     ERGO 50,000 units once weekly Vit D levels WNL To avoid oversupplementation, will decrease dose to every 10 days   Follow up:   No follow-ups on file.*** She was informed of the importance of frequent follow up visits to maximize her success with intensive lifestyle modifications for her multiple health conditions.  Subjective:   Chief complaint: Obesity Theresa Johnson is here to discuss her progress with her obesity treatment plan. She is on the Category 1 Plan with 2-4 extra ounces of lean protein and states she is following her eating plan approximately 80% of the time. She states she is walking 3 miles 6 days per week.  Interval History:  Theresa Johnson is here for a follow up office visit. Since last OV on ,  ***   Walking has helped with mental health     Pharmacotherapy for weight loss: She is currently taking Metformin  500 mg BID.   Review of Systems:  Pertinent positives were addressed with patient today.  Reviewed by clinician on day of visit: allergies, medications, problem list, medical history, surgical history, family history, social history, and previous encounter notes.  Weight Summary and Biometrics   Weight Lost Since Last Visit: 2lb  Weight Gained Since Last Visit: 0lb  ***  Vitals Temp: 98 F (36.7 C) BP: 121/72 Pulse Rate: 73 SpO2:  98 %   Anthropometric Measurements Height: 5\' 6"  (1.676 m) Weight: 167 lb (75.8 kg) BMI (Calculated): 26.97 Weight at Last Visit: 169lb Weight Lost Since Last Visit: 2lb Weight Gained Since Last Visit: 0lb Starting Weight: 227lb Total Weight Loss (lbs): 60 lb (27.2 kg) Peak Weight: 227lb   Body Composition  Body Fat %: 32.6 % Fat Mass (lbs): 54.4 lbs Muscle Mass (lbs): 106.8 lbs Total Body Water (lbs): 77.8 lbs Visceral Fat Rating : 7   Other Clinical Data Fasting: No Labs: No Today's Visit #: 30 Starting Date: 10/03/20    Objective:   PHYSICAL EXAM: Blood pressure 121/72, pulse 73, temperature 98 F (36.7 C), height 5\' 6"  (1.676 m), weight 167 lb (75.8 kg), SpO2 98%. Body mass index is 26.95 kg/m.  General:  she is overweight, cooperative and in no acute distress. PSYCH: Has normal mood, affect and thought process.   HEENT: EOMI, sclerae are anicteric. Lungs: Normal breathing effort, no conversational dyspnea. Extremities: Moves * 4 Neurologic: A and O * 3, good insight  DIAGNOSTIC DATA REVIEWED: BMET    Component Value Date/Time   NA 141 12/09/2023 0941   K 3.6 12/09/2023 0941   CL 103 12/09/2023 0941   CO2 23 12/09/2023 0941   GLUCOSE 88 12/09/2023 0941   GLUCOSE 95 05/19/2021 0817   BUN 17 12/09/2023 0941   CREATININE 0.89 12/09/2023 0941   CALCIUM 9.6 12/09/2023 0941   GFRNONAA >60 05/19/2021 0817   GFRAA 94 10/03/2020 1213   Lab Results  Component Value Date   HGBA1C 5.4 12/09/2023   HGBA1C 5.4 10/03/2020   Lab Results  Component Value Date   INSULIN  6.7 12/09/2023   INSULIN  5.5 10/03/2020   Lab Results  Component Value Date   TSH 1.380 06/03/2022   CBC    Component Value Date/Time   WBC 4.5 12/09/2023 0941   WBC 8.4 05/19/2021 0802   RBC 4.07 12/09/2023 0941   RBC 3.97 05/19/2021 0802   HGB 13.2 12/09/2023 0941   HCT 39.9 12/09/2023 0941   PLT 214 12/09/2023 0941   MCV 98 (H) 12/09/2023 0941   MCH 32.4 12/09/2023 0941   MCH  33.8 05/19/2021 0802   MCHC 33.1 12/09/2023 0941   MCHC 33.4 05/19/2021 0802   RDW 12.3 12/09/2023 0941   Iron Studies    Component Value Date/Time   FERRITIN 234.7 07/03/2020 0953   Lipid Panel     Component Value Date/Time   CHOL 231 (H) 12/09/2023 0941   TRIG 99 12/09/2023 0941   HDL 84 12/09/2023 0941   CHOLHDL 2.8 12/09/2023 0941   CHOLHDL 3 07/03/2020 0953   VLDL 19.0 07/03/2020 0953   LDLCALC 130 (H) 12/09/2023 0941   Hepatic Function Panel     Component Value Date/Time   PROT 6.4 12/31/2022 1023   ALBUMIN 4.1 12/31/2022 1023   AST 22 12/31/2022 1023   ALT 17 12/31/2022 1023   ALKPHOS 94 12/31/2022 1023   BILITOT 0.9 12/31/2022 1023      Component Value Date/Time   TSH 1.380 06/03/2022 0941   Nutritional Lab Results  Component Value Date   VD25OH 68.8 12/09/2023   VD25OH 56.2 05/25/2023   VD25OH 52.0 12/31/2022    Attestations:   I, ***, acting as a Stage manager for Marceil Sensor, DO., have compiled all relevant documentation for today's office visit on behalf of Marceil Sensor, DO, while in the presence of [employer], DO.  Reviewed by clinician on day of visit: allergies, medications, problem list, medical history, surgical history, family history, social history, and previous encounter notes pertinent to patient's obesity diagnosis. I have spent 40 minutes in the care of the patient today including: Direct face-to-face counseling for 33 minutes on her lab results, how to read nutritional labels, how to recognize good/bad fats to improve cholesterol levels, and above disease management and counseling. 7 minutes spent in any pre/post chart review and information below: preparing to see patient (e.g. review and interpretation of tests, old notes ), obtaining and/or reviewing separately obtained history, performing a medically appropriate examination or evaluation, counseling and educating the patient, ordering medications, test or procedures,  documenting clinical information in the electronic or other health care record, and independently interpreting results and communicating results to the patient, family, or caregiver  I have reviewed the above documentation for accuracy and completeness, and I agree with the above. Theresa Johnson, D.O.  The 21st Century Cures Act was signed into law in 2016 which includes the topic of electronic health records.  This provides immediate access to information in MyChart.  This includes consultation notes, operative notes, office notes, lab results and pathology reports.  If you have any questions about what you read please let us  know at your next visit so we can discuss your concerns and take corrective action if need be.  We are right here with you.

## 2024-01-04 ENCOUNTER — Ambulatory Visit

## 2024-01-06 ENCOUNTER — Ambulatory Visit
Admission: RE | Admit: 2024-01-06 | Discharge: 2024-01-06 | Disposition: A | Discharge status: Home / Self Care | Source: Ambulatory Visit | Attending: Obstetrics and Gynecology | Admitting: Obstetrics and Gynecology

## 2024-01-06 DIAGNOSIS — Z1231 Encounter for screening mammogram for malignant neoplasm of breast: Secondary | ICD-10-CM

## 2024-02-13 ENCOUNTER — Emergency Department (HOSPITAL_COMMUNITY)
Admission: EM | Admit: 2024-02-13 | Discharge: 2024-02-14 | Disposition: A | Discharge status: Home / Self Care | Attending: Emergency Medicine | Admitting: Emergency Medicine

## 2024-02-13 ENCOUNTER — Other Ambulatory Visit: Payer: Self-pay

## 2024-02-13 ENCOUNTER — Encounter (HOSPITAL_COMMUNITY): Payer: Self-pay

## 2024-02-13 DIAGNOSIS — T782XXA Anaphylactic shock, unspecified, initial encounter: Secondary | ICD-10-CM | POA: Insufficient documentation

## 2024-02-13 DIAGNOSIS — R9431 Abnormal electrocardiogram [ECG] [EKG]: Secondary | ICD-10-CM | POA: Diagnosis not present

## 2024-02-13 DIAGNOSIS — R21 Rash and other nonspecific skin eruption: Secondary | ICD-10-CM | POA: Diagnosis not present

## 2024-02-13 MED ORDER — METHYLPREDNISOLONE SODIUM SUCC 125 MG IJ SOLR
125.0000 mg | Freq: Once | INTRAMUSCULAR | Status: AC
  Administered 2024-02-13: 125 mg via INTRAVENOUS
  Filled 2024-02-13: qty 2

## 2024-02-13 MED ORDER — FAMOTIDINE IN NACL 20-0.9 MG/50ML-% IV SOLN
20.0000 mg | Freq: Once | INTRAVENOUS | Status: AC
  Administered 2024-02-13: 20 mg via INTRAVENOUS
  Filled 2024-02-13: qty 50

## 2024-02-13 MED ORDER — EPINEPHRINE 0.3 MG/0.3ML IJ SOAJ
INTRAMUSCULAR | Status: AC
  Administered 2024-02-13: 0.3 mg
  Filled 2024-02-13: qty 0.3

## 2024-02-13 MED ORDER — DIPHENHYDRAMINE HCL 50 MG/ML IJ SOLN
50.0000 mg | Freq: Once | INTRAMUSCULAR | Status: AC
  Administered 2024-02-13: 50 mg via INTRAVENOUS
  Filled 2024-02-13: qty 1

## 2024-02-13 NOTE — ED Provider Triage Note (Signed)
 Emergency Medicine Provider Triage Evaluation Note  Theresa Johnson , a 53 y.o. female  was evaluated in triage.  Pt complains of hives, suspected allergic reaction. Symptoms began approx 15 minutes ago, patient ate shrimp for dinner with pesto pizza, both of which she has eaten previously. H/o seasonal allergies, no known food allergies, no known stings/insect bites.  Review of Systems  Positive: As above Negative: As above  Physical Exam  Ht 5' 6 (1.676 m)   Wt 74.8 kg   BMI 26.63 kg/m  Gen:   Awake, no distress   Resp:  Normal effort, no wheezing MSK:   Moves extremities without difficulty  Other:  Hives to face/neck/chest beginning to spread to arms, swelling of upper lip noted, airway patent  Medical Decision Making  Medically screening exam initiated at 9:40 PM.  Appropriate orders placed.  Theresa Johnson was informed that the remainder of the evaluation will be completed by another provider, this initial triage assessment does not replace that evaluation, and the importance of remaining in the ED until their evaluation is complete.  IM Epi given in triage, patient seen by Dr. Patsey for airway assessment.  Benadryl /Solumedrol/Pepcid  ordered.   Theresa Johnson, [place] 02/13/24 [id number]

## 2024-02-13 NOTE — ED Triage Notes (Signed)
 Complaining of hives all over. Ate a seafood boil today. She is also having some shortness of breath and her throat is swelling she feels like.

## 2024-02-14 MED ORDER — PREDNISONE 10 MG (21) PO TBPK: ORAL_TABLET | Freq: Every day | ORAL | 0 refills | Status: DC

## 2024-02-14 MED ORDER — EPINEPHRINE 0.3 MG/0.3ML IJ SOAJ
0.3000 mg | INTRAMUSCULAR | 0 refills | Status: AC | PRN
Start: 2024-02-14 — End: ?

## 2024-02-14 NOTE — ED Notes (Signed)
 Pt looks better, hives are gone, and she is ready to go. Advised will have to watch closer to another hour.

## 2024-02-14 NOTE — Discharge Instructions (Addendum)
 You were seen today for what appears to be an anaphylactic reaction.  I have prescribed a dose of steroids to be taken as directed.  I have also prescribed an EpiPen  to be used if you see any signs of anaphylaxis.  Please follow with your primary care provider.  If you do develop signs of anaphylaxis you should also return to the emergency department immediately for further evaluation.

## 2024-02-14 NOTE — ED Provider Notes (Signed)
 [place] EMERGENCY DEPARTMENT AT [facility] Provider Note   CSN: [id number] Arrival date & time: 02/13/24  2124     Patient presents with: Rash   Theresa Johnson is a 53 y.o. female.  Patient with past medical history significant for allergic rhinitis, lymphedema presents to the emergency department complaining of widespread hives, mild shortness of breath, facial swelling that began this evening after eating leftover seafood.  The patient denies any history of previous allergies to seafood.  At the time of arrival anaphylaxis treatment has been initiated.  She denies GI upset, chest pain, abdominal pain, nausea, vomiting.    Rash      Prior to Admission medications   Medication Sig Start Date End Date Taking? Authorizing Provider  ASHWAGANDHA PO Take by mouth.    [provider]  metFORMIN  (GLUCOPHAGE ) 500 MG tablet 1 tab po with food 12/13/23   Opalski, Deborah, DO  Multiple Vitamins-Minerals (MULTIVITAMIN WITH MINERALS) tablet Take 1 tablet by mouth daily.    [provider]  NON FORMULARY Nutrafol    [provider]  Vitamin D , Ergocalciferol , (DRISDOL ) 1.25 MG (50000 UNIT) CAPS capsule 1 po q 10 days 12/13/23   Midge Sober, DO  vitamin E 1000 UNIT capsule Take by mouth daily.    [provider]    Allergies: Lactose intolerance (gi)    Review of Systems  Skin:  Positive for rash.    Updated Vital Signs BP (!) 148/79 (BP Location: Right Arm)   Pulse 81   Temp 98.5 F (36.9 C) (Oral)   Resp 16   Ht 5' 6 (1.676 m)   Wt 74.8 kg   SpO2 98%   BMI 26.63 kg/m   Physical Exam Vitals and nursing note reviewed.  Constitutional:      General: She is not in acute distress.    Appearance: She is well-developed.  HENT:     Head: Normocephalic and atraumatic.     Mouth/Throat:     Comments: Mild tongue swelling and right upper lip swelling noted at time of arrival, improved after medication Eyes:      Conjunctiva/sclera: Conjunctivae normal.  Cardiovascular:     Rate and Rhythm: Normal rate and regular rhythm.  Pulmonary:     Effort: Pulmonary effort is normal. No respiratory distress.     Breath sounds: Normal breath sounds.  Abdominal:     Palpations: Abdomen is soft.     Tenderness: There is no abdominal tenderness.  Musculoskeletal:        General: No swelling.     Cervical back: Neck supple.  Skin:    General: Skin is warm and dry.     Capillary Refill: Capillary refill takes less than 2 seconds.     Comments: Widespread urticaria on upper extremities and face.  Improved after medications  Neurological:     Mental Status: She is alert.  Psychiatric:        Mood and Affect: Mood normal.     (all labs ordered are listed, but only abnormal results are displayed) Labs Reviewed - No data to display  EKG: None  Radiology: No results found.   .Critical Care  Performed by: Logan Ubaldo NOVAK, PA-C Authorized by: Logan Ubaldo NOVAK, PA-C   Critical care provider statement:    Critical care time (minutes):  30   Critical care time was exclusive of:  Separately billable procedures and treating other patients   Critical care was necessary to treat or prevent  imminent or life-threatening deterioration of the following conditions: Anaphylaxis.   Critical care was time spent personally by me on the following activities:  Development of treatment plan with patient or surrogate, discussions with consultants, evaluation of patient's response to treatment, examination of patient, ordering and review of laboratory studies, ordering and review of radiographic studies, ordering and performing treatments and interventions, pulse oximetry, re-evaluation of patient's condition and review of old charts    Medications Ordered in the ED  EPINEPHrine  (EPI-PEN) 0.3 mg/0.3 mL injection (0.3 mg  Given 02/13/24 2142)  methylPREDNISolone  sodium succinate (SOLU-MEDROL ) 125 mg/2 mL injection 125 mg  (125 mg Intravenous Given 02/13/24 2147)  diphenhydrAMINE  (BENADRYL ) injection 50 mg (50 mg Intravenous Given 02/13/24 2149)  famotidine  (PEPCID ) IVPB 20 mg premix (0 mg Intravenous Stopped 02/13/24 2222)                                    Medical Decision Making  This patient presents to the ED for concern of allergic reaction, this involves an extensive number of treatment options, and is a complaint that carries with it a high risk of complications and morbidity.  The differential diagnosis includes anaphylaxis, allergic reaction, others   Co morbidities / Chronic conditions that complicate the patient evaluation  Allergic rhinitis   Additional history obtained:  Additional history obtained from EMR External records from outside source obtained and reviewed including primary care notes   Cardiac Monitoring: / EKG:  The patient was maintained on a cardiac monitor.  I personally viewed and interpreted the cardiac monitored which showed an underlying rhythm of: Sinus rhythm   Problem List / ED Course / Critical interventions / Medication management   I ordered medication including EpiPen , Benadryl , Solu-Medrol , Pepcid  Reevaluation of the patient after these medicines showed that the patient improved I have reviewed the patients home medicines and have made adjustments as needed   Test / Admission - Considered:  Patient with presentation consistent with anaphylaxis.  Her vitals have remained stable.  Her symptoms improved significantly to the point of near resolution with medication.  At this time the patient has been monitored for 3 hours since the epinephrine  administration and she appears stable for discharge home.  I plan to discharge home with a prescription for a steroid burst and an EpiPen .  Patient will follow-up with her primary care provider for further evaluation.  Return precautions have been provided.      Final diagnoses:  None    ED Discharge Orders     None           Logan Ubaldo KATHEE DEVONNA 02/14/24 [id number]    Carita Senior, MD 02/14/24 [phone]

## 2024-02-15 ENCOUNTER — Encounter: Payer: Self-pay | Admitting: Internal Medicine

## 2024-02-16 NOTE — Telephone Encounter (Signed)
 Advise patient this can be done during her hospital follow up?

## 2024-03-06 DIAGNOSIS — I8393 Asymptomatic varicose veins of bilateral lower extremities: Secondary | ICD-10-CM | POA: Diagnosis not present

## 2024-03-06 DIAGNOSIS — L509 Urticaria, unspecified: Secondary | ICD-10-CM | POA: Diagnosis not present

## 2024-03-06 DIAGNOSIS — B360 Pityriasis versicolor: Secondary | ICD-10-CM | POA: Diagnosis not present

## 2024-03-16 ENCOUNTER — Encounter (INDEPENDENT_AMBULATORY_CARE_PROVIDER_SITE_OTHER): Payer: Self-pay | Admitting: Family Medicine

## 2024-03-16 ENCOUNTER — Ambulatory Visit (INDEPENDENT_AMBULATORY_CARE_PROVIDER_SITE_OTHER): Admitting: Family Medicine

## 2024-03-16 VITALS — BP 115/74 | HR 67 | Temp 98.7°F | Ht 66.0 in | Wt 164.0 lb

## 2024-03-16 DIAGNOSIS — Z6826 Body mass index (BMI) 26.0-26.9, adult: Secondary | ICD-10-CM

## 2024-03-16 DIAGNOSIS — E559 Vitamin D deficiency, unspecified: Secondary | ICD-10-CM | POA: Diagnosis not present

## 2024-03-16 DIAGNOSIS — E88819 Insulin resistance, unspecified: Secondary | ICD-10-CM | POA: Diagnosis not present

## 2024-03-16 DIAGNOSIS — E669 Obesity, unspecified: Secondary | ICD-10-CM | POA: Diagnosis not present

## 2024-03-16 NOTE — Progress Notes (Signed)
 Barnie DOROTHA Jenkins, D.O.  ABFM, ABOM Specializing in Clinical Bariatric Medicine  Office located at: [address] [place], [place]  [address]   Assessment and Plan:    FOR THE DISEASE OF OBESITY:  Obesity, Beginning BMI 36.64 BMI 26.0-26.9,adult - Current BMI 26.48 Assessment & Plan: Since last office visit on 12/13/23 patient's muscle mass was maintained. Fat mass has decreased by 2.2 lbs. Total body water has decreased by 3.6 lbs. Counseling done on how various foods will affect these numbers and how to maximize success  Total lbs lost to date: 63 lbs Total weight loss percentage to date: -27.75 %   Recommended Dietary Goals Ruqayya is currently in the action stage of change. As such, her goal is to continue weight management plan. Reviewed being in a 500 calorie deficit per day to lose about 1 lb of fat per week. If pt would like to start journaling, she may adhere to 1100 calories and 85++ g of protein per day.   She has agreed to: continue current plan   Behavioral Intervention We discussed the following today: increasing lean protein intake to established goals, decreasing simple carbohydrates , and continue to work on maintaining a reduced calorie state, getting the recommended amount of protein, incorporating whole foods, making healthy choices, staying well hydrated and practicing mindfulness when eating., benefits of being in a 500 cal deficit to lose ~1 lb of fat per week.   Additional resources provided today: Handout on Common Characteristics of Successful Weight Losers and Maintainers  and Handout on Chat GPT [place] recipes  Evidence-based interventions for health behavior change were utilized today including the discussion of self monitoring techniques, problem-solving barriers and SMART goal setting techniques. Regarding patient's less desirable eating habits and patterns, we employed the technique of small changes.   Pt will specifically work on:  n/a   Recommended Physical Activity Goals Maleah has been advised to work up to 300-450 minutes of moderate intensity aerobic activity a week and strengthening exercises 2-3 times per week for cardiovascular health, weight loss maintenance and preservation of muscle mass.   She has agreed to: Continue current level of physical activity    Pharmacotherapy We both agreed to: Continue with current nutritional and behavioral strategies and continue Metformin     ASSOCIATED CONDITIONS ADDRESSED TODAY:  Insulin  resistance Assessment & Plan: Lab Results  Component Value Date   HGBA1C 5.4 12/09/2023   HGBA1C 5.2 06/03/2022   HGBA1C 5.3 11/24/2021   INSULIN  6.7 12/09/2023   INSULIN  2.7 12/31/2022   INSULIN  4.8 06/03/2022    Compliance with Metformin  500 mg once daily. Tolerating well with no adverse SE. Also working on [employer], exercise, and lifestyle interventions. No acute concerns today.   Reviewed the importance of water as it relates to glucose regulation and improving insulin  sensitivity. Properly hydrate by drinking 1/2 her body wt in ounces of water her day. Continue working on increasing her protein intake, decreasing simple carbs and added sugars. Continue regular exercise. Continue with Metformin  as prescribed.     Vitamin D  deficiency Assessment & Plan: Lab Results  Component Value Date   VD25OH 68.8 12/09/2023   VD25OH 56.2 05/25/2023   VD25OH 52.0 12/31/2022   Currently on Ergocalciferol  50K units once daily. Good compliance/tolerance. Denies any muscle spasms/cramps or nausea. No acute concerns in this regard.   Continue current supplementation regimen. No changes made today. Will continue monitoring.     Follow up:   Return in about 3 months (  around 06/16/2024) for 3 mo f/u . She was informed of the importance of frequent follow up visits to maximize her success with intensive lifestyle modifications for her multiple health conditions.  Subjective:    Chief complaint: Obesity Fayette is here to discuss her progress with her obesity treatment plan. She is on the Category 1 Plan and states she is following her eating plan approximately 80% of the time. She states she is doing pilates 50-60 minutes 7  days per week and is walking.   Interval History:  Jaxsyn M White-Haith is here for a follow up office visit. Since last OV on 12/13/23, she is down 3 lbs.  She recently got sick with Covid and was unable to keep up with her regular exercise routine for about 2 weeks. She has starting going back to pilates classes and also meets with her instructor for 1-on-1 sessions.To avoid getting bored of lean protein intake, she has tried changing up her chicken breast recipe. She has also tried eating cauliflower rice. Once a month she has been eating off plan, which usually involves her over-indulging on potato chips. She endorses feeling achy and bloated for a few days after not following her meal plan.   Pt reports an upcoming trip to [place].    Pharmacotherapy that aid with weight loss: She is currently taking Metformin  500 mg once daily.    Review of Systems:  Pertinent positives were addressed with patient today.  Reviewed by clinician on day of visit: allergies, medications, problem list, medical history, surgical history, family history, social history, and previous encounter notes.  Weight Summary and Biometrics   Weight Lost Since Last Visit: 3lb  Weight Gained Since Last Visit: 0    Vitals Temp: 98.7 F (37.1 C) BP: 115/74 Pulse Rate: 67 SpO2: 100 %   Anthropometric Measurements Height: 5' 6 (1.676 m) Weight: 164 lb (74.4 kg) BMI (Calculated): 26.48 Weight at Last Visit: 167lb Weight Lost Since Last Visit: 3lb Weight Gained Since Last Visit: 0 Starting Weight: 227lb Total Weight Loss (lbs): 63 lb (28.6 kg)   Body Composition  Body Fat %: 31.7 % Fat Mass (lbs): 52.2 lbs Muscle Mass (lbs): 106.8 lbs Total Body Water  (lbs): 74.2 lbs Visceral Fat Rating : 7   Other Clinical Data Fasting: no Labs: no Today's Visit #: 31 Starting Date: 10/03/20    Objective:   PHYSICAL EXAM: Blood pressure 115/74, pulse 67, temperature 98.7 F (37.1 C), height 5' 6 (1.676 m), weight 164 lb (74.4 kg), SpO2 100%. Body mass index is 26.47 kg/m.  General: she is overweight, cooperative and in no acute distress. PSYCH: Has normal mood, affect and thought process.   HEENT: EOMI, sclerae are anicteric. Lungs: Normal breathing effort, no conversational dyspnea. Extremities: Moves * 4 Neurologic: A and O * 3, good insight  DIAGNOSTIC DATA REVIEWED: BMET    Component Value Date/Time   NA 141 12/09/2023 0941   K 3.6 12/09/2023 0941   CL 103 12/09/2023 0941   CO2 23 12/09/2023 0941   GLUCOSE 88 12/09/2023 0941   GLUCOSE 95 05/19/2021 0817   BUN 17 12/09/2023 0941   CREATININE 0.89 12/09/2023 0941   CALCIUM 9.6 12/09/2023 0941   GFRNONAA >60 05/19/2021 0817   GFRAA 94 10/03/2020 1213   Lab Results  Component Value Date   HGBA1C 5.4 12/09/2023   HGBA1C 5.4 10/03/2020   Lab Results  Component Value Date   INSULIN  6.7 12/09/2023   INSULIN  5.5 10/03/2020   Lab  Results  Component Value Date   TSH 1.380 06/03/2022   CBC    Component Value Date/Time   WBC 4.5 12/09/2023 0941   WBC 8.4 05/19/2021 0802   RBC 4.07 12/09/2023 0941   RBC 3.97 05/19/2021 0802   HGB 13.2 12/09/2023 0941   HCT 39.9 12/09/2023 0941   PLT 214 12/09/2023 0941   MCV 98 (H) 12/09/2023 0941   MCH 32.4 12/09/2023 0941   MCH 33.8 05/19/2021 0802   MCHC 33.1 12/09/2023 0941   MCHC 33.4 05/19/2021 0802   RDW 12.3 12/09/2023 0941   Iron Studies    Component Value Date/Time   FERRITIN 234.7 07/03/2020 0953   Lipid Panel     Component Value Date/Time   CHOL 231 (H) 12/09/2023 0941   TRIG 99 12/09/2023 0941   HDL 84 12/09/2023 0941   CHOLHDL 2.8 12/09/2023 0941   CHOLHDL 3 07/03/2020 0953   VLDL 19.0 07/03/2020 0953    LDLCALC 130 (H) 12/09/2023 0941   Hepatic Function Panel     Component Value Date/Time   PROT 6.4 12/31/2022 1023   ALBUMIN 4.1 12/31/2022 1023   AST 22 12/31/2022 1023   ALT 17 12/31/2022 1023   ALKPHOS 94 12/31/2022 1023   BILITOT 0.9 12/31/2022 1023      Component Value Date/Time   TSH 1.380 06/03/2022 0941   Nutritional Lab Results  Component Value Date   VD25OH 68.8 12/09/2023   VD25OH 56.2 05/25/2023   VD25OH 52.0 12/31/2022    Attestations:   I, Vernell Forest, acting as a Stage manager for Barnie Jenkins, DO., have compiled all relevant documentation for today's office visit on behalf of Barnie Jenkins, DO, while in the presence of [employer], DO.  Reviewed by clinician on day of visit: allergies, medications, problem list, medical history, surgical history, family history, social history, and previous encounter notes pertinent to patient's obesity diagnosis.  I have spent 31 minutes in the care of the patient today including 23 minutes face-to-face assessing and reviewing listed medical problems above as outlined in office visit note and providing nutritional and behavioral counseling as outlined in obesity care plan.   I have reviewed the above documentation for accuracy and completeness, and I agree with the above. Barnie JINNY Jenkins, D.O.  The 21st Century Cures Act was signed into law in 2016 which includes the topic of electronic health records.  This provides immediate access to information in MyChart.  This includes consultation notes, operative notes, office notes, lab results and pathology reports.  If you have any questions about what you read please let us  know at your next visit so we can discuss your concerns and take corrective action if need be.  We are right here with you.

## 2024-03-27 ENCOUNTER — Other Ambulatory Visit (INDEPENDENT_AMBULATORY_CARE_PROVIDER_SITE_OTHER): Payer: Self-pay | Admitting: Family Medicine

## 2024-03-27 DIAGNOSIS — E559 Vitamin D deficiency, unspecified: Secondary | ICD-10-CM

## 2024-04-28 ENCOUNTER — Encounter (INDEPENDENT_AMBULATORY_CARE_PROVIDER_SITE_OTHER): Payer: Self-pay | Admitting: Family Medicine

## 2024-05-01 ENCOUNTER — Other Ambulatory Visit (INDEPENDENT_AMBULATORY_CARE_PROVIDER_SITE_OTHER): Payer: Self-pay | Admitting: Family Medicine

## 2024-05-01 DIAGNOSIS — E88819 Insulin resistance, unspecified: Secondary | ICD-10-CM

## 2024-05-01 DIAGNOSIS — E559 Vitamin D deficiency, unspecified: Secondary | ICD-10-CM

## 2024-05-01 MED ORDER — METFORMIN HCL 500 MG PO TABS: ORAL_TABLET | ORAL | 0 refills | Status: DC

## 2024-05-01 MED ORDER — METFORMIN HCL 500 MG PO TABS
ORAL_TABLET | ORAL | 0 refills | Status: DC
Start: 2024-05-01 — End: 2024-05-01

## 2024-05-01 MED ORDER — VITAMIN D (ERGOCALCIFEROL) 1.25 MG (50000 UNIT) PO CAPS: ORAL_CAPSULE | ORAL | 0 refills | Status: DC

## 2024-05-18 DIAGNOSIS — I83891 Varicose veins of right lower extremities with other complications: Secondary | ICD-10-CM | POA: Diagnosis not present

## 2024-05-25 ENCOUNTER — Telehealth (INDEPENDENT_AMBULATORY_CARE_PROVIDER_SITE_OTHER): Payer: Self-pay | Admitting: Family Medicine

## 2024-05-25 NOTE — Telephone Encounter (Signed)
 Good morning!  Patient asked if she could do her labs bfore her appointment so that they can be discussed during. Her appt in 11/19 and she said she can come in a day or two before if the orders are put in. Thanks!

## 2024-06-08 DIAGNOSIS — Z01419 Encounter for gynecological examination (general) (routine) without abnormal findings: Secondary | ICD-10-CM | POA: Diagnosis not present

## 2024-06-15 ENCOUNTER — Ambulatory Visit (INDEPENDENT_AMBULATORY_CARE_PROVIDER_SITE_OTHER): Admitting: Family Medicine

## 2024-06-23 ENCOUNTER — Encounter: Payer: BC Managed Care – PPO | Admitting: Internal Medicine

## 2024-06-26 DIAGNOSIS — I83891 Varicose veins of right lower extremities with other complications: Secondary | ICD-10-CM | POA: Diagnosis not present

## 2024-06-28 ENCOUNTER — Encounter (INDEPENDENT_AMBULATORY_CARE_PROVIDER_SITE_OTHER): Payer: Self-pay | Admitting: Family Medicine

## 2024-06-28 ENCOUNTER — Ambulatory Visit (INDEPENDENT_AMBULATORY_CARE_PROVIDER_SITE_OTHER): Payer: Self-pay | Admitting: Family Medicine

## 2024-06-28 VITALS — BP 113/69 | HR 67 | Temp 98.3°F | Ht 66.0 in | Wt 166.0 lb

## 2024-06-28 DIAGNOSIS — E88819 Insulin resistance, unspecified: Secondary | ICD-10-CM

## 2024-06-28 DIAGNOSIS — E559 Vitamin D deficiency, unspecified: Secondary | ICD-10-CM

## 2024-06-28 DIAGNOSIS — E669 Obesity, unspecified: Secondary | ICD-10-CM | POA: Diagnosis not present

## 2024-06-28 DIAGNOSIS — Z636 Dependent relative needing care at home: Secondary | ICD-10-CM | POA: Diagnosis not present

## 2024-06-28 DIAGNOSIS — Z6826 Body mass index (BMI) 26.0-26.9, adult: Secondary | ICD-10-CM

## 2024-06-28 DIAGNOSIS — E7849 Other hyperlipidemia: Secondary | ICD-10-CM

## 2024-06-28 NOTE — Progress Notes (Signed)
 Theresa Johnson, D.O.  ABFM, ABOM Specializing in Clinical Bariatric Medicine  Office located at: [address] [place], [place]  [address]    FOR THE CHRONIC DISEASE OF OBESITY:   Obesity, Beginning BMI 36.64 BMI 26.0-26.9,adult - Current BMI 26.81  Weight Summary and Body Composition Analysis  Weight Lost Since Last Visit: 0lb  Weight Gained Since Last Visit: 2lb    Vitals Temp: 98.3 F (36.8 C) BP: 113/69 Pulse Rate: 67 SpO2: 97 %   Anthropometric Measurements Height: 5' 6 (1.676 m) Weight: 166 lb (75.3 kg) BMI (Calculated): 26.81 Weight at Last Visit: 164lb Weight Lost Since Last Visit: 0lb Weight Gained Since Last Visit: 2lb Starting Weight: 227lb Total Weight Loss (lbs): 61 lb (27.7 kg)   Body Composition  Body Fat %: 30.9 % Fat Mass (lbs): 51.6 lbs Muscle Mass (lbs): 109.4 lbs Total Body Water (lbs): 74 lbs Visceral Fat Rating : 7   Other Clinical Data Fasting: yes Labs: no Today's Visit #: 32 Starting Date: 10/03/20    Chief complaint: Obesity  Interval History Theresa Johnson is here for a follow-up office visit to discuss her progress with her obesity treatment plan. She is on the Category 1 Plan and states she is following her eating plan approximately 75 % of the time. She is doing pilates 50 minutes 7 days per week and  walking 50 minutes 7 days per week.   She has experienced a weight gain of 2 lbs since last OV on 03/16/2024.   Barriers identified: Her mother, who has Alzheimer's disease, is currently living with her. This has been stressful for the patient, as her mother is often combative and confused. She is actively seeking a care home placement for her.  Her dietary and life habits include:  - Tracking Calories/Macros: yes  - Eating More Whole Foods: yes  - Adequate Protein Intake: yes  - Adequate Water Intake: yes  - Skipping Meals: no  - Sleeping 7-9 Hours/ Night: no    03/16/24 09:00 06/28/24 10:00    Body Fat % 31.7 % 30.9 %  Muscle Mass (lbs) 106.8 lbs 109.4 lbs  Fat Mass (lbs) 52.2 lbs 51.6 lbs  Total Body Water (lbs) 74.2 lbs 74 lbs  Visceral Fat Rating  7 7   Counseling done on how various foods will affect these numbers and how to maximize success  Total Fat mass lost to date: - 48 lbs Total lbs lost to date: - 61 lbs Total weight loss percentage to date: -26.87 %   Nutritional and Behavioral Counseling:  We discussed the following today: work on managing stress, creating time for self-care and relaxation, continue to work on implementation of reduced calorie nutritional plan, going to the [employer] for recipe ideas, and using GPT or another AI platform for recipe ideas- searching low calorie, low carb, high protein chicken recipes etc  Additional resources provided today: n/a  Evidence-based interventions for health behavior change were utilized today including the discussion of self monitoring techniques, problem-solving barriers and SMART goal setting techniques.   Regarding patient's less desirable eating habits and patterns, we employed the technique of small changes.   SMART Goal(s) created today: meditate 10 minutes twice daily for stress management   Recommended Dietary Goals Karlena is currently in the action stage of change. As such, her goal is to continue weight management plan.  She has agreed to continue the CAT 1 MP.   Recommended Physical Activity Goals Jaisa has been  advised to work up to 300-450 minutes of moderate intensity aerobic activity a week and strengthening exercises 2-3 times per week for cardiovascular health, weight loss maintenance and preservation of muscle mass.   She has agreed to: Continue to gradually increase the amount and intensity of exercise routine   Medical Interventions and Pharmacotherapy Previous Bariatric surgery: n/a Pharmacotherapy: Cont Metformin  at current dose.    OBESITY RELATED CONDITIONS ADDRESSED  TODAY:   Orders Placed This Encounter  Procedures   Lipid panel   Hemoglobin A1c   Insulin , random   Vitamin B12   VITAMIN D  25 Hydroxy (Vit-D Deficiency, Fractures)   TSH   T4, free   Comprehensive metabolic panel with GFR   Magnesium    Insulin  resistance Assessment & Plan: Lab Results  Component Value Date   HGBA1C 5.4 12/09/2023   HGBA1C 5.2 06/03/2022   HGBA1C 5.3 11/24/2021   INSULIN  6.7 12/09/2023   INSULIN  2.7 12/31/2022   INSULIN  4.8 06/03/2022    Compliance with Metformin  500 mg once daily. Tolerating well with no adverse SE. Also working on [employer], exercise, and lifestyle interventions. No acute concerns today.  Continue Metformin  therapy and working on nutrition plan to decrease simple carbohydrates, increase lean proteins and exercise to promote weight loss and prevent progression to Pre-DM. Recheck labs today.    Vitamin D  deficiency Assessment & Plan: Lab Results  Component Value Date   VD25OH 68.8 12/09/2023   VD25OH 56.2 05/25/2023   VD25OH 52.0 12/31/2022   Currently on Ergocalciferol  50K units q 10 days. Good compliance/tolerance. Denies any muscle spasms/cramps or nausea. No acute concerns in this regard. Cont supplementation. Recheck labs today.    Other hyperlipidemia Assessment & Plan: Lab Results  Component Value Date   CHOL 231 (H) 12/09/2023   HDL 84 12/09/2023   LDLCALC 130 (H) 12/09/2023   TRIG 99 12/09/2023   CHOLHDL 2.8 12/09/2023   HLD managed with dietary and lifestyle interventions. Will recheck lipid panel today. Continue to maintain a diet low in saturated and trans fats.     Caregiver stress Assessment & Plan: Her mother, who has Alzheimer's disease, is currently living with her. This has been a significant source of stress for the patient, as her mother is often combative and confused. She is actively seeking a care home placement for her. We extensively discussed stress-management strategies and the importance of  creating time for self-care and relaxation. Recommended practicing 4-7-8 breathing twice daily, establishing care with a counselor, and increasing exercise as tolerated. Cont prudent nutritional plan to support overall emotional well-being.    Objective:   PHYSICAL EXAM: Blood pressure 113/69, pulse 67, temperature 98.3 F (36.8 C), height 5' 6 (1.676 m), weight 166 lb (75.3 kg), SpO2 97%. Body mass index is 26.79 kg/m.  General: she is overweight, cooperative and in no acute distress. PSYCH: Has normal mood, affect and thought process.   HEENT: EOMI, sclerae are anicteric. Lungs: Normal breathing effort, no conversational dyspnea. Extremities: Moves * 4 Neurologic: A and O * 3, good insight  DIAGNOSTIC DATA REVIEWED: BMET    Component Value Date/Time   NA 141 12/09/2023 0941   K 3.6 12/09/2023 0941   CL 103 12/09/2023 0941   CO2 23 12/09/2023 0941   GLUCOSE 88 12/09/2023 0941   GLUCOSE 95 05/19/2021 0817   BUN 17 12/09/2023 0941   CREATININE 0.89 12/09/2023 0941   CALCIUM 9.6 12/09/2023 0941   GFRNONAA >60 05/19/2021 0817   GFRAA 94 10/03/2020 1213  Lab Results  Component Value Date   HGBA1C 5.4 12/09/2023   HGBA1C 5.4 10/03/2020   Lab Results  Component Value Date   INSULIN  6.7 12/09/2023   INSULIN  5.5 10/03/2020   Lab Results  Component Value Date   TSH 1.380 06/03/2022   CBC    Component Value Date/Time   WBC 4.5 12/09/2023 0941   WBC 8.4 05/19/2021 0802   RBC 4.07 12/09/2023 0941   RBC 3.97 05/19/2021 0802   HGB 13.2 12/09/2023 0941   HCT 39.9 12/09/2023 0941   PLT 214 12/09/2023 0941   MCV 98 (H) 12/09/2023 0941   MCH 32.4 12/09/2023 0941   MCH 33.8 05/19/2021 0802   MCHC 33.1 12/09/2023 0941   MCHC 33.4 05/19/2021 0802   RDW 12.3 12/09/2023 0941   Iron Studies    Component Value Date/Time   FERRITIN 234.7 07/03/2020 0953   Lipid Panel     Component Value Date/Time   CHOL 231 (H) 12/09/2023 0941   TRIG 99 12/09/2023 0941   HDL 84  12/09/2023 0941   CHOLHDL 2.8 12/09/2023 0941   CHOLHDL 3 07/03/2020 0953   VLDL 19.0 07/03/2020 0953   LDLCALC 130 (H) 12/09/2023 0941   Hepatic Function Panel     Component Value Date/Time   PROT 6.4 12/31/2022 1023   ALBUMIN 4.1 12/31/2022 1023   AST 22 12/31/2022 1023   ALT 17 12/31/2022 1023   ALKPHOS 94 12/31/2022 1023   BILITOT 0.9 12/31/2022 1023      Component Value Date/Time   TSH 1.380 06/03/2022 0941   Nutritional Lab Results  Component Value Date   VD25OH 68.8 12/09/2023   VD25OH 56.2 05/25/2023   VD25OH 52.0 12/31/2022     Follow up:   Return 08/17/2024 at 9:20 AM.  She was informed of the importance of frequent follow up visits to maximize her success with intensive lifestyle modifications for her multiple health conditions.   Attestations:   I, Special Puri, acting as a stage manager for [employer], DO., have compiled all relevant documentation for today's office visit on behalf of Theresa Jenkins, DO, while in the presence of [employer], DO.  Pertinent positives were addressed with patient today. Reviewed by clinician on day of visit: allergies, medications, problem list, medical history, surgical history, family history, social history, and previous encounter notes.  I have spent 40 minutes in the care of the patient today including 30 minutes face-to-face assessing and reviewing listed medical problems above as outlined in office visit note and providing nutritional and behavioral counseling as outlined in obesity care plan.   I have reviewed the above documentation for accuracy and completeness, and I agree with the above. Theresa Theresa Johnson, D.O.  The 21st Century Cures Act was signed into law in 2016 which includes the topic of electronic health records.  This provides immediate access to information in MyChart. This includes consultation notes, operative notes, office notes, lab results and pathology reports.  If you have any questions about  what you read please let us  know at your next visit so we can discuss your concerns and take corrective action if need be.  We are right here with you.

## 2024-06-29 LAB — T4, FREE: Free T4: 1.32 ng/dL (ref 0.82–1.77)

## 2024-06-29 LAB — COMPREHENSIVE METABOLIC PANEL WITH GFR
ALT: 18 IU/L (ref 0–32)
AST: 26 IU/L (ref 0–40)
Albumin: 4.5 g/dL (ref 3.8–4.9)
Alkaline Phosphatase: 81 IU/L (ref 49–135)
BUN/Creatinine Ratio: 22 (ref 9–23)
BUN: 18 mg/dL (ref 6–24)
Bilirubin Total: 0.9 mg/dL (ref 0.0–1.2)
CO2: 21 mmol/L (ref 20–29)
Calcium: 9.6 mg/dL (ref 8.7–10.2)
Chloride: 103 mmol/L (ref 96–106)
Creatinine, Ser: 0.82 mg/dL (ref 0.57–1.00)
Globulin, Total: 2.6 g/dL (ref 1.5–4.5)
Glucose: 86 mg/dL (ref 70–99)
Potassium: 4.1 mmol/L (ref 3.5–5.2)
Sodium: 141 mmol/L (ref 134–144)
Total Protein: 7.1 g/dL (ref 6.0–8.5)
eGFR: 84 mL/min/1.73 (ref >59)

## 2024-06-29 LAB — VITAMIN D 25 HYDROXY (VIT D DEFICIENCY, FRACTURES): Vit D, 25-Hydroxy: 74.4 ng/mL (ref 30.0–100.0)

## 2024-06-29 LAB — LIPID PANEL
Chol/HDL Ratio: 2.7 ratio (ref 0.0–4.4)
Cholesterol, Total: 246 mg/dL — ABNORMAL HIGH (ref 100–199)
HDL: 91 mg/dL (ref >39)
LDL Chol Calc (NIH): 143 mg/dL — ABNORMAL HIGH (ref 0–99)
Triglycerides: 70 mg/dL (ref 0–149)
VLDL Cholesterol Cal: 12 mg/dL (ref 5–40)

## 2024-06-29 LAB — INSULIN, RANDOM: INSULIN: 5.7 u[IU]/mL (ref 2.6–24.9)

## 2024-06-29 LAB — HEMOGLOBIN A1C
Est. average glucose Bld gHb Est-mCnc: 105 mg/dL
Hgb A1c MFr Bld: 5.3 % (ref 4.8–5.6)

## 2024-06-29 LAB — MAGNESIUM: Magnesium: 2.3 mg/dL (ref 1.6–2.3)

## 2024-06-29 LAB — VITAMIN B12: Vitamin B-12: 711 pg/mL (ref 232–1245)

## 2024-06-29 LAB — TSH: TSH: 1.27 u[IU]/mL (ref 0.450–4.500)

## 2024-07-04 ENCOUNTER — Encounter: Payer: Self-pay | Admitting: Internal Medicine

## 2024-07-04 ENCOUNTER — Ambulatory Visit: Admitting: Internal Medicine

## 2024-07-04 VITALS — BP 130/80 | HR 55 | Temp 98.4°F | Ht 66.0 in | Wt 172.0 lb

## 2024-07-04 DIAGNOSIS — Z0000 Encounter for general adult medical examination without abnormal findings: Secondary | ICD-10-CM

## 2024-07-04 DIAGNOSIS — E559 Vitamin D deficiency, unspecified: Secondary | ICD-10-CM

## 2024-07-04 DIAGNOSIS — Z23 Encounter for immunization: Secondary | ICD-10-CM | POA: Diagnosis not present

## 2024-07-04 DIAGNOSIS — I872 Venous insufficiency (chronic) (peripheral): Secondary | ICD-10-CM | POA: Diagnosis not present

## 2024-07-04 NOTE — Progress Notes (Unsigned)
   Subjective:   Patient ID: Theresa Johnson, female    DOB: 08/23/1970, 53 y.o.   MRN: [id number]  The patient is here for physical. Pertinent topics discussed: Discussed the use of AI scribe software for clinical note transcription with the patient, who gave verbal consent to proceed.  History of Present Illness Theresa Johnson is a 53 year old female who presents for follow-up on cholesterol levels and menopause-related symptoms.  She is experiencing menopause-related symptoms, including hot flashes and significant sleep disturbances. She is able to fall asleep but not stay asleep, often waking up after three to four hours and unable to return to sleep. She is currently using an estrogen-progesterone cream from a compound pharmacy, which initially helped but now seems less effective. She continues to experience hot flashes, though less frequently than before.  She has a history of venous insufficiency and is undergoing treatment with a vein specialist. She reports having pain in her legs, which she initially attributed to increased physical activity. She is currently undergoing a series of treatments for venous reflux, including injections, and is wearing compression hose.  She experienced a severe allergic reaction in the past, the cause of which is unknown. She has been referred to an allergist, but the appointment is not until July of next year. In the meantime, she is avoiding shrimp and pesto, which she consumed on the day of the reaction.  PMH, [facility], social history reviewed and updated  Review of Systems  Constitutional: Negative.   HENT: Negative.    Eyes: Negative.   Respiratory:  Negative for cough, chest tightness and shortness of breath.   Cardiovascular:  Negative for chest pain, palpitations and leg swelling.  Gastrointestinal:  Negative for abdominal distention, abdominal pain, constipation, diarrhea, nausea and vomiting.  Musculoskeletal: Negative.   Skin:  Negative.   Neurological: Negative.   Psychiatric/Behavioral: Negative.      Objective:  Physical Exam Constitutional:      Appearance: She is well-developed.  HENT:     Head: Normocephalic and atraumatic.  Cardiovascular:     Rate and Rhythm: Normal rate and regular rhythm.  Pulmonary:     Effort: Pulmonary effort is normal. No respiratory distress.     Breath sounds: Normal breath sounds. No wheezing or rales.  Abdominal:     General: Bowel sounds are normal. There is no distension.     Palpations: Abdomen is soft.     Tenderness: There is no abdominal tenderness.  Musculoskeletal:     Cervical back: Normal range of motion.  Skin:    General: Skin is warm and dry.  Neurological:     Mental Status: She is alert and oriented to person, place, and time.     Coordination: Coordination normal.     Vitals:   07/04/24 1341  BP: 130/80  Pulse: (!) 55  Temp: 98.4 F (36.9 C)  TempSrc: Oral  SpO2: 99%  Weight: 172 lb (78 kg)  Height: 5' 6 (1.676 m)    Assessment & Plan:  Prevnar 20 given at visit

## 2024-07-05 DIAGNOSIS — I83892 Varicose veins of left lower extremities with other complications: Secondary | ICD-10-CM | POA: Diagnosis not present

## 2024-07-05 NOTE — Assessment & Plan Note (Signed)
 Seeing vascular and getting intervention currently.

## 2024-07-05 NOTE — Assessment & Plan Note (Signed)
 Flu shot up to ate. Pneumonia 20 given. Shingrix complete. Tetanus up to date. Colonoscopy up to date. Mammogram up to date, pap smear up to date. Counseled about sun safety and mole surveillance. Counseled about the dangers of distracted driving. Given 10 year screening recommendations.

## 2024-07-05 NOTE — Assessment & Plan Note (Signed)
 Recent labs reviewed and appropriate with otc supplementation.

## 2024-07-08 ENCOUNTER — Encounter (INDEPENDENT_AMBULATORY_CARE_PROVIDER_SITE_OTHER): Payer: Self-pay | Admitting: Family Medicine

## 2024-07-10 ENCOUNTER — Telehealth: Payer: Self-pay | Admitting: Internal Medicine

## 2024-07-10 ENCOUNTER — Other Ambulatory Visit: Payer: Self-pay

## 2024-07-10 ENCOUNTER — Telehealth: Payer: Self-pay

## 2024-07-10 DIAGNOSIS — E559 Vitamin D deficiency, unspecified: Secondary | ICD-10-CM

## 2024-07-10 DIAGNOSIS — E88819 Insulin resistance, unspecified: Secondary | ICD-10-CM

## 2024-07-10 NOTE — Telephone Encounter (Unsigned)
 Copied from CRM #[id number]. Topic: Clinical - Medication Refill >> Jul 10, 2024 10:32 AM Darshell M wrote: Medication: Vitamin D , Ergocalciferol , (DRISDOL ) 1.25 MG (50000 UNIT) CAPS capsule metFORMIN  (GLUCOPHAGE ) 500 MG tablet  Has the patient contacted their pharmacy? Yes (Agent: If no, request that the patient contact the pharmacy for the refill. If patient does not wish to contact the pharmacy document the reason why and proceed with request.) (Agent: If yes, when and what did the pharmacy advise?)  This is the patient's preferred pharmacy:  [id number] - [place], [place] - [address] AT [address] OF [facility] [address] [place]  [address] Phone: [phone] Fax: [phone]  Is this the correct pharmacy for this prescription? Yes If no, delete pharmacy and type the correct one.   Has the prescription been filled recently? No  Is the patient out of the medication? No - Patient has enough for 2 days but is going out of town for 10 days beginning Thursday and needs the medication refilled before she goes out of town.   Has the patient been seen for an appointment in the last year OR does the patient have an upcoming appointment? Yes  Can we respond through MyChart? Yes  Agent: Please be advised that Rx refills may take up to 3 business days. We ask that you follow-up with your pharmacy.

## 2024-07-10 NOTE — Telephone Encounter (Unsigned)
 Copied from CRM #[id number]. Topic: Clinical - Prescription Issue >> Jul 10, 2024 10:38 AM Darshell M wrote: Reason for CRM: Patient is traveling beginning Thursday and does not have enough medication for the trip. Refill request entered and patient sent to note to provider in [place] but ask additional note be sent to RN.

## 2024-07-11 MED ORDER — VITAMIN D (ERGOCALCIFEROL) 1.25 MG (50000 UNIT) PO CAPS: ORAL_CAPSULE | ORAL | 0 refills | Status: AC

## 2024-07-11 MED ORDER — METFORMIN HCL 500 MG PO TABS: ORAL_TABLET | ORAL | 0 refills | Status: DC

## 2024-07-12 NOTE — Telephone Encounter (Signed)
 I don't see any meds listed here

## 2024-07-12 NOTE — Telephone Encounter (Signed)
 LVM for pt to call office to clarify.

## 2024-07-27 DIAGNOSIS — I83891 Varicose veins of right lower extremities with other complications: Secondary | ICD-10-CM | POA: Diagnosis not present

## 2024-08-17 ENCOUNTER — Encounter (INDEPENDENT_AMBULATORY_CARE_PROVIDER_SITE_OTHER): Payer: Self-pay | Admitting: Family Medicine

## 2024-08-17 ENCOUNTER — Ambulatory Visit (INDEPENDENT_AMBULATORY_CARE_PROVIDER_SITE_OTHER): Admitting: Family Medicine

## 2024-08-17 VITALS — BP 103/65 | HR 61 | Temp 98.2°F | Ht 66.0 in | Wt 171.0 lb

## 2024-08-17 DIAGNOSIS — E88819 Insulin resistance, unspecified: Secondary | ICD-10-CM

## 2024-08-17 DIAGNOSIS — Z6826 Body mass index (BMI) 26.0-26.9, adult: Secondary | ICD-10-CM

## 2024-08-17 DIAGNOSIS — E669 Obesity, unspecified: Secondary | ICD-10-CM

## 2024-08-17 DIAGNOSIS — Z6827 Body mass index (BMI) 27.0-27.9, adult: Secondary | ICD-10-CM | POA: Diagnosis not present

## 2024-08-17 DIAGNOSIS — Z636 Dependent relative needing care at home: Secondary | ICD-10-CM | POA: Diagnosis not present

## 2024-08-17 DIAGNOSIS — E7849 Other hyperlipidemia: Secondary | ICD-10-CM | POA: Diagnosis not present

## 2024-08-17 DIAGNOSIS — E559 Vitamin D deficiency, unspecified: Secondary | ICD-10-CM

## 2024-08-17 MED ORDER — METFORMIN HCL 500 MG PO TABS: ORAL_TABLET | ORAL | 0 refills | Status: AC

## 2024-08-17 NOTE — Progress Notes (Signed)
 "  Theresa Johnson, D.O.  ABFM, ABOM Specializing in Clinical Bariatric Medicine  Office located at: [address] [place], [place]  [address]      Medications Discontinued During This Encounter  Medication Reason   metFORMIN  (GLUCOPHAGE ) 500 MG tablet Reorder     Meds ordered this encounter  Medications   metFORMIN  (GLUCOPHAGE ) 500 MG tablet    Sig: 1 tab po with lunch and dinner daily    Dispense:  180 tablet    Refill:  0    90 d supply;  ** OV for RF **   Do not send RF request     A) FOR THE CHRONIC DISEASE OF OBESITY:  Chief complaint: Obesity Theresa Johnson is here to discuss her progress with her obesity treatment plan.   History of present illness / Interval history:  Theresa Johnson is here today for her follow-up office visit.  Since last OV on 06/28/24, pt is up 5 lbs. Patient states that for Thanksgiving she went to [place]. She endorses that she gained weight and being able to tell. She has been struggling to sleep at night.    06/28/24 10:00 08/17/24 09:00   Body Fat % 30.9 % 33.2 %  Muscle Mass (lbs) 109.4 lbs 108.6 lbs  Fat Mass (lbs) 51.6 lbs 57 lbs  Total Body Water (lbs) 74 lbs 75.8 lbs  Visceral Fat Rating  7 8    Counseling done on how various foods will affect these numbers and how to maximize success.  Total lbs lost to date: - 56 lbs Total Fat Mass in lbs lost to date: - 42.6 lbs Total weight loss percentage to date: -24.67 %    Obesity, Beginning BMI 36.64 BMI 26.0-26.9,adult - Current BMI 27.61  Nutrition Therapy She is on the Category 1 Plan and states she is following her eating plan approximately 50 % of the time.   - Tracking Calories/Macros: no   - Eating More Whole Foods: yes  - Adequate Protein Intake: yes  - Adequate Water Intake: yes  - Skipping Meals: no   - Sleeping 7-9 Hours/ Night: no   Theresa Johnson is currently in the action stage of change. As such, her goal is to continue weight management plan. She has  agreed to: continue current plan   Physical Activity Theresa Johnson is doing pilates 60  minutes 2 days per week  Theresa Johnson has been advised to work up to 300-450 minutes of moderate intensity aerobic activity a week and strengthening exercises 2-3 times per week for cardiovascular health, weight loss maintenance and preservation of muscle mass.  She has agreed to : Increase volume of physical activity to a goal of 240 minutes a week and Combine aerobic and strengthening exercises for efficiency and improved cardiometabolic health.   Behavioral Modifications Evidence-based interventions for health behavior change were utilized today including the discussion of  1) self care:    - Practice meditation 10 min BID Regarding patient's less desirable eating habits and patterns, we employed the technique of small changes esp d/t to pt's recent stressors.    Medical Interventions/ Pharmacotherapy Previous Bariatric surgery: n/a Pharmacotherapy for weight loss: She is currently taking Metformin  500 mg once daily for medical weight loss.    We discussed various medication options to help Theresa Johnson with her weight loss efforts and we both agreed to : Adequate clinical response to anti-obesity medication, continue current anti-obesity regimen   B) OBESITY RELATED CONDITIONS ADDRESSED TODAY:  Insulin  resistance  Labs  from last OV reviewed with pt in full today.  Assessment & Plan Lab Results  Component Value Date   HGBA1C 5.3 06/28/2024   HGBA1C 5.4 12/09/2023   HGBA1C 5.2 06/03/2022   INSULIN  5.7 06/28/2024   INSULIN  6.7 12/09/2023   INSULIN  2.7 12/31/2022    Lab Results  Component Value Date   TSH 1.270 06/28/2024   T3TOTAL 103 10/03/2020   FREET4 1.32 06/28/2024   Lab Results  Component Value Date   NA 141 06/28/2024   CL 103 06/28/2024   K 4.1 06/28/2024   CO2 21 06/28/2024   BUN 18 06/28/2024   CREATININE 0.82 06/28/2024   EGFR 84 06/28/2024   CALCIUM 9.6 06/28/2024   ALBUMIN 4.5  06/28/2024   GLUCOSE 86 06/28/2024      Component Value Date/Time   NA 141 06/28/2024 1109   K 4.1 06/28/2024 1109   CL 103 06/28/2024 1109   CO2 21 06/28/2024 1109   GLUCOSE 86 06/28/2024 1109   GLUCOSE 95 05/19/2021 0817   BUN 18 06/28/2024 1109   CREATININE 0.82 06/28/2024 1109   CALCIUM 9.6 06/28/2024 1109   PROT 7.1 06/28/2024 1109   ALBUMIN 4.5 06/28/2024 1109   AST 26 06/28/2024 1109   ALT 18 06/28/2024 1109   ALKPHOS 81 06/28/2024 1109   BILITOT 0.9 06/28/2024 1109   GFR 79.18 07/03/2020 0953   EGFR 84 06/28/2024 1109   GFRNONAA >60 05/19/2021 0817   On Metformin  500 mg once daily with reported good compliance and tolerance. Patient states that she has had good control of her hunger. She states that she occasionally has more cravings lately around dinner and finding it difficult to control after holidays.  Labs discussed: -A1C levels have decreased to 5.3 which show excellent control. -Insulin  levels have also decreasing to 5.7 which are almost at goal. -Thyroid panel and kidney panel are WNL  -no acute concerns.   R/B med inc vs additional meds d/c pt today.  Renal fxn stable.  Mutually agreed to increase Metformin  to twice daily. In order to help reduce craving later in the day. Continue following prudent meal plan and decreasing simple carbs and sugars.    Vitamin D  deficiency Assessment & Plan Lab Results  Component Value Date   VD25OH 74.4 06/28/2024   VD25OH 68.8 12/09/2023   VD25OH 56.2 05/25/2023   Lab Results  Component Value Date   VITAMINB12 711 06/28/2024   FOLATE 13.5 06/03/2022   On ERGO 50K units once every 10 days. With reported good compliance and tolerance. Patient states that she has no issues remembering to take the supplementation. Vit D levels are at goal. Reminded patient the importance of at goal Vit D levels. Continue with supplementation since we are in winter. Will recheck levels in 4 months.   B12/ folate levels are at goal; no acute  concerns.     Caregiver stress Assessment & Plan Patient has had an increase of stress since caring for her mother. She cares deeply for her mother and the stress for caring for her has started to take a toll on her. Patient states that she also recently hosted Christmas and it added extra stress on her. Recently there has been conversation within the family about getting a full time caregiver for her mother or getting her placed in a long term care facility to help relieve some stress but also in order to get the best care for her mother.   Reminded patient the importance of self  care in face of caregiver stress. - strategies discussed to achieve this - Will continue to monitor and reassess at next OV.  - Pt declines need for medications at this time - reminded how helpful counseling can be in these situations.  May seek CBT     Other hyperlipidemia  Assessment & Plan Lab Results  Component Value Date   CHOL 246 (H) 06/28/2024   HDL 91 06/28/2024   LDLCALC 143 (H) 06/28/2024   TRIG 70 06/28/2024   CHOLHDL 2.7 06/28/2024    Diet and life style controlled.  Patient HDL levels have improved. Trig levels have decreased but LDL levels have increased. Explained to patient that at this moment she does not need to be placed on any medications. As patient continue to exercise and decrease saturated and trans fat LDL levels will decrease and HDL levels will increase. Will recheck panel in 6 months.     Medications Discontinued During This Encounter  Medication Reason   metFORMIN  (GLUCOPHAGE ) 500 MG tablet Reorder     Meds ordered this encounter  Medications   metFORMIN  (GLUCOPHAGE ) 500 MG tablet    Sig: 1 tab po with lunch and dinner daily    Dispense:  180 tablet    Refill:  0    90 d supply;  ** OV for RF **   Do not send RF request      Follow up:   Return 09/28/2024 at 10:20 AM  She was informed of the importance of frequent follow up visits to maximize her success with  intensive lifestyle modifications for her multiple health conditions.   Weight Summary and Biometrics   Weight Lost Since Last Visit: 0lb  Weight Gained Since Last Visit: 5lb   Vitals Temp: 98.2 F (36.8 C) BP: 103/65 Pulse Rate: 61 SpO2: 97 %   Anthropometric Measurements Height: 5' 6 (1.676 m) Weight: 171 lb (77.6 kg) BMI (Calculated): 27.61 Weight at Last Visit: 166lb Weight Lost Since Last Visit: 0lb Weight Gained Since Last Visit: 5lb Starting Weight: 227lb Total Weight Loss (lbs): 56 lb (25.4 kg)   Body Composition  Body Fat %: 33.2 % Fat Mass (lbs): 57 lbs Muscle Mass (lbs): 108.6 lbs Total Body Water (lbs): 75.8 lbs Visceral Fat Rating : 8   Other Clinical Data Fasting: yes Labs: no Today's Visit #: 33 Starting Date: 10/03/20    Objective:   PHYSICAL EXAM: Blood pressure 103/65, pulse 61, temperature 98.2 F (36.8 C), height 5' 6 (1.676 m), weight 171 lb (77.6 kg), SpO2 97%. Body mass index is 27.6 kg/m.  General: she is overweight, cooperative and in no acute distress. PSYCH: Has normal mood, affect and thought process.   HEENT: EOMI, sclerae are anicteric. Lungs: Normal breathing effort, no conversational dyspnea. Extremities: Moves * 4 Neurologic: A and O * 3, good insight  DIAGNOSTIC DATA REVIEWED: BMET    Component Value Date/Time   NA 141 06/28/2024 1109   K 4.1 06/28/2024 1109   CL 103 06/28/2024 1109   CO2 21 06/28/2024 1109   GLUCOSE 86 06/28/2024 1109   GLUCOSE 95 05/19/2021 0817   BUN 18 06/28/2024 1109   CREATININE 0.82 06/28/2024 1109   CALCIUM 9.6 06/28/2024 1109   GFRNONAA >60 05/19/2021 0817   GFRAA 94 10/03/2020 1213   Lab Results  Component Value Date   HGBA1C 5.3 06/28/2024   HGBA1C 5.4 10/03/2020   Lab Results  Component Value Date   INSULIN  5.7 06/28/2024   INSULIN  5.5 10/03/2020  Lab Results  Component Value Date   TSH 1.270 06/28/2024   CBC    Component Value Date/Time   WBC 4.5 12/09/2023  0941   WBC 8.4 05/19/2021 0802   RBC 4.07 12/09/2023 0941   RBC 3.97 05/19/2021 0802   HGB 13.2 12/09/2023 0941   HCT 39.9 12/09/2023 0941   PLT 214 12/09/2023 0941   MCV 98 (H) 12/09/2023 0941   MCH 32.4 12/09/2023 0941   MCH 33.8 05/19/2021 0802   MCHC 33.1 12/09/2023 0941   MCHC 33.4 05/19/2021 0802   RDW 12.3 12/09/2023 0941   Iron Studies    Component Value Date/Time   FERRITIN 234.7 07/03/2020 0953   Lipid Panel     Component Value Date/Time   CHOL 246 (H) 06/28/2024 1109   TRIG 70 06/28/2024 1109   HDL 91 06/28/2024 1109   CHOLHDL 2.7 06/28/2024 1109   CHOLHDL 3 07/03/2020 0953   VLDL 19.0 07/03/2020 0953   LDLCALC 143 (H) 06/28/2024 1109   Hepatic Function Panel     Component Value Date/Time   PROT 7.1 06/28/2024 1109   ALBUMIN 4.5 06/28/2024 1109   AST 26 06/28/2024 1109   ALT 18 06/28/2024 1109   ALKPHOS 81 06/28/2024 1109   BILITOT 0.9 06/28/2024 1109      Component Value Date/Time   TSH 1.270 06/28/2024 1109   Nutritional Lab Results  Component Value Date   VD25OH 74.4 06/28/2024   VD25OH 68.8 12/09/2023   VD25OH 56.2 05/25/2023    Attestations:   I, Sonny Laroche, acting as a stage manager for Theresa Jenkins, DO., have compiled all relevant documentation for today's office visit on behalf of Theresa Jenkins, DO, while in the presence of [employer], DO.  I have reviewed the above documentation for accuracy and completeness, and I agree with the above. Theresa Johnson, D.O.  The 21st Century Cures Act was signed into law in 2016 which includes the topic of electronic health records.  This provides immediate access to information in MyChart.  This includes consultation notes, operative notes, office notes, lab results and pathology reports.  If you have any questions about what you read please let us  know at your next visit so we can discuss your concerns and take corrective action if need be.  We are right here with you.  "

## 2024-09-28 ENCOUNTER — Ambulatory Visit (INDEPENDENT_AMBULATORY_CARE_PROVIDER_SITE_OTHER): Admitting: Family Medicine

## 2025-07-09 ENCOUNTER — Encounter: Admitting: Internal Medicine
